# Patient Record
Sex: Female | Born: 1965 | Hispanic: No | Marital: Married | Smoking: Former smoker
Health system: Southern US, Community
[De-identification: ages and names within clinical notes are randomized; demographics above are authoritative.]

## PROBLEM LIST (undated history)

## (undated) DIAGNOSIS — K759 Inflammatory liver disease, unspecified: Secondary | ICD-10-CM

## (undated) DIAGNOSIS — M5430 Sciatica, unspecified side: Secondary | ICD-10-CM

## (undated) DIAGNOSIS — I10 Essential (primary) hypertension: Secondary | ICD-10-CM

## (undated) HISTORY — PX: OTHER SURGICAL HISTORY: SHX169

## (undated) HISTORY — PX: CHOLECYSTECTOMY: SHX55

## (undated) HISTORY — DX: Sciatica, unspecified side: M54.30

---

## 2004-05-16 ENCOUNTER — Ambulatory Visit: Payer: Self-pay | Admitting: Family Medicine

## 2004-06-04 ENCOUNTER — Ambulatory Visit: Payer: Self-pay | Admitting: Family Medicine

## 2005-08-21 ENCOUNTER — Ambulatory Visit: Payer: Self-pay | Admitting: Family Medicine

## 2006-04-15 ENCOUNTER — Emergency Department: Payer: Self-pay | Admitting: Internal Medicine

## 2006-09-28 ENCOUNTER — Emergency Department: Payer: Self-pay | Admitting: Emergency Medicine

## 2007-10-06 ENCOUNTER — Ambulatory Visit: Payer: Self-pay | Admitting: Family Medicine

## 2007-11-28 ENCOUNTER — Emergency Department: Payer: Self-pay | Admitting: Internal Medicine

## 2010-02-09 ENCOUNTER — Emergency Department: Payer: Self-pay | Admitting: Emergency Medicine

## 2011-01-21 ENCOUNTER — Ambulatory Visit: Payer: Self-pay | Admitting: Family Medicine

## 2011-04-30 ENCOUNTER — Ambulatory Visit: Payer: Self-pay

## 2011-05-03 ENCOUNTER — Ambulatory Visit: Payer: Self-pay

## 2011-06-03 ENCOUNTER — Ambulatory Visit: Payer: Self-pay

## 2011-07-03 ENCOUNTER — Ambulatory Visit: Payer: Self-pay

## 2011-08-03 ENCOUNTER — Ambulatory Visit: Payer: Self-pay

## 2011-11-03 HISTORY — PX: ROUX-EN-Y GASTRIC BYPASS: SHX1104

## 2012-03-25 ENCOUNTER — Ambulatory Visit: Payer: Self-pay | Admitting: Gastroenterology

## 2012-04-13 ENCOUNTER — Other Ambulatory Visit: Payer: Self-pay | Admitting: Family

## 2012-04-13 LAB — APTT: Activated PTT: 27.8 secs (ref 23.6–35.9)

## 2012-04-15 ENCOUNTER — Ambulatory Visit: Payer: Self-pay | Admitting: Gastroenterology

## 2012-04-29 ENCOUNTER — Other Ambulatory Visit (HOSPITAL_COMMUNITY): Payer: Self-pay | Admitting: Family

## 2012-04-29 DIAGNOSIS — B181 Chronic viral hepatitis B without delta-agent: Secondary | ICD-10-CM

## 2012-04-30 ENCOUNTER — Other Ambulatory Visit: Payer: Self-pay | Admitting: Radiology

## 2012-05-01 ENCOUNTER — Other Ambulatory Visit: Payer: Self-pay | Admitting: Radiology

## 2012-05-06 ENCOUNTER — Ambulatory Visit (HOSPITAL_COMMUNITY)
Admission: RE | Admit: 2012-05-06 | Discharge: 2012-05-06 | Disposition: A | Discharge status: Home / Self Care | Payer: BC Managed Care – PPO | Source: Ambulatory Visit | Attending: Gastroenterology | Admitting: Gastroenterology

## 2012-05-06 ENCOUNTER — Encounter (HOSPITAL_COMMUNITY): Payer: Self-pay

## 2012-05-06 ENCOUNTER — Ambulatory Visit (HOSPITAL_COMMUNITY)
Admission: RE | Admit: 2012-05-06 | Discharge: 2012-05-06 | Disposition: A | Discharge status: Home / Self Care | Payer: BC Managed Care – PPO | Source: Ambulatory Visit | Attending: Family | Admitting: Family

## 2012-05-06 DIAGNOSIS — B181 Chronic viral hepatitis B without delta-agent: Secondary | ICD-10-CM

## 2012-05-06 DIAGNOSIS — I10 Essential (primary) hypertension: Secondary | ICD-10-CM | POA: Insufficient documentation

## 2012-05-06 DIAGNOSIS — K7689 Other specified diseases of liver: Secondary | ICD-10-CM | POA: Insufficient documentation

## 2012-05-06 DIAGNOSIS — R7401 Elevation of levels of liver transaminase levels: Secondary | ICD-10-CM | POA: Insufficient documentation

## 2012-05-06 DIAGNOSIS — R7402 Elevation of levels of lactic acid dehydrogenase (LDH): Secondary | ICD-10-CM | POA: Insufficient documentation

## 2012-05-06 DIAGNOSIS — R7989 Other specified abnormal findings of blood chemistry: Secondary | ICD-10-CM | POA: Insufficient documentation

## 2012-05-06 DIAGNOSIS — E119 Type 2 diabetes mellitus without complications: Secondary | ICD-10-CM | POA: Insufficient documentation

## 2012-05-06 HISTORY — DX: Essential (primary) hypertension: I10

## 2012-05-06 HISTORY — DX: Inflammatory liver disease, unspecified: K75.9

## 2012-05-06 LAB — PROTIME-INR
INR: 0.99 (ref 0.00–1.49)
Prothrombin Time: 13 seconds (ref 11.6–15.2)

## 2012-05-06 LAB — CBC
Platelets: 221 10*3/uL (ref 150–400)
RBC: 4.55 MIL/uL (ref 3.87–5.11)
RDW: 13.8 % (ref 11.5–15.5)
WBC: 7 10*3/uL (ref 4.0–10.5)

## 2012-05-06 LAB — APTT: aPTT: 30 seconds (ref 24–37)

## 2012-05-06 MED ORDER — SODIUM CHLORIDE 0.9 % IV SOLN
INTRAVENOUS | Status: DC
  Administered 2012-05-06: 09:00:00 via INTRAVENOUS

## 2012-05-06 MED ORDER — FENTANYL CITRATE 0.05 MG/ML IJ SOLN
INTRAMUSCULAR | Status: AC | PRN
  Administered 2012-05-06: 50 ug via INTRAVENOUS
  Administered 2012-05-06 (×2): 25 ug via INTRAVENOUS

## 2012-05-06 MED ORDER — FENTANYL CITRATE 0.05 MG/ML IJ SOLN
INTRAMUSCULAR | Status: AC
  Filled 2012-05-06: qty 6

## 2012-05-06 MED ORDER — MIDAZOLAM HCL 2 MG/2ML IJ SOLN
INTRAMUSCULAR | Status: AC
  Filled 2012-05-06: qty 6

## 2012-05-06 MED ORDER — MIDAZOLAM HCL 2 MG/2ML IJ SOLN
INTRAMUSCULAR | Status: AC | PRN
  Administered 2012-05-06 (×2): 0.5 mg via INTRAVENOUS
  Administered 2012-05-06: 1 mg via INTRAVENOUS

## 2012-05-06 NOTE — Procedures (Signed)
Technically successful US guided biopsy of inferior aspect of right lobe of the liver.  No immediate complications.

## 2012-05-06 NOTE — H&P (Signed)
Carrie Davies is an 47 y.o. female.   Chief Complaint: "I'm here for a liver biopsy" HPI: Patient with history of chronic hepatitis B, elevated LFT's and recent nondiagnostic random liver biopsy at [facility]. She presents today for repeat random liver biopsy.  Past Medical History  Diagnosis Date  . Hypertension   . Hepatitis   DM, carpal tunnel syndrome, ?prior TB  Past Surgical History  Procedure Laterality Date  . Cholecystectomy    . 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 Cancer - Other Mother    Social History:  does not have a smoking history on file. She has never used smokeless tobacco. She reports that she does not drink alcohol or use illicit drugs.  Allergies: Not on File  Current outpatient prescriptions:triamterene-hydrochlorothiazide (MAXZIDE-25) 37.5-25 MG per tablet, Take 1 tablet by mouth daily., Disp: , Rfl:  Current facility-administered medications:0.9 %  sodium chloride infusion, , Intravenous, Continuous, D Jeananne Rama, PA-C   Results for orders placed during the hospital encounter of 05/06/12 (from the past 48 hour(s))  APTT     Status: None   Collection Time    05/06/12  8:35 AM      Result Value Range   aPTT 30  24 - 37 seconds  CBC     Status: None   Collection Time    05/06/12  8:35 AM      Result Value Range   WBC 7.0  4.0 - 10.5 K/uL   RBC 4.55  3.87 - 5.11 MIL/uL   Hemoglobin 13.4  12.0 - 15.0 g/dL   HCT 96.0  45.4 - 09.8 %   MCV 85.9  78.0 - 100.0 fL   MCH 29.5  26.0 - 34.0 pg   MCHC 34.3  30.0 - 36.0 g/dL   RDW 11.9  14.7 - 82.9 %   Platelets 221  150 - 400 K/uL  PROTIME-INR     Status: None   Collection Time    05/06/12  8:35 AM      Result Value Range   Prothrombin Time 13.0  11.6 - 15.2 seconds   INR 0.99  0.00 - 1.49   No results found.  Review of Systems  Constitutional: Negative for fever and chills.  Respiratory: Negative for cough and shortness of  breath.   Cardiovascular: Negative for chest pain.  Gastrointestinal: Negative for nausea, vomiting and abdominal pain.  Musculoskeletal: Negative for back pain.  Neurological: Negative for headaches.  Endo/Heme/Allergies: Does not bruise/bleed easily.    Blood pressure 125/82, pulse 74, temperature 98.6 F (37 C), temperature source Oral, resp. rate 18, height 5\' 5"  (1.651 m), weight 232 lb (105.235 kg), SpO2 99.00%. Physical Exam  Constitutional: She is oriented to person, place, and time. She appears well-developed and well-nourished.  Cardiovascular: Normal rate and regular rhythm.   Respiratory: Effort normal and breath sounds normal.  GI: Soft. Bowel sounds are normal. There is no tenderness.  obese  Musculoskeletal: Normal range of motion. She exhibits no edema.  Neurological: She is alert and oriented to person, place, and time.     Assessment/Plan Pt with hx chronic hepatitis B, elevated LFT's and recent nondiagnostic random liver biopsy at [facility].  Plan is for repeat random liver biopsy today. Details/risks of procedure d/w pt/husband with their understanding and consent.  ALLRED,D KEVIN 05/06/2012, 9:31 AM

## 2012-08-05 ENCOUNTER — Ambulatory Visit: Payer: Self-pay | Admitting: Otolaryngology

## 2012-09-02 ENCOUNTER — Ambulatory Visit: Payer: Self-pay | Admitting: Family Medicine

## 2012-10-14 ENCOUNTER — Ambulatory Visit: Payer: Self-pay | Admitting: Unknown Physician Specialty

## 2012-10-14 LAB — BASIC METABOLIC PANEL
Anion Gap: 6 — ABNORMAL LOW (ref 7–16)
BUN: 18 mg/dL (ref 7–18)
Calcium, Total: 8.9 mg/dL (ref 8.5–10.1)
Chloride: 107 mmol/L (ref 98–107)
Co2: 29 mmol/L (ref 21–32)
Creatinine: 0.71 mg/dL (ref 0.60–1.30)
EGFR (African American): >60
EGFR (Non-African Amer.): >60
Glucose: 134 mg/dL — ABNORMAL HIGH (ref 65–99)
Osmolality: 287 (ref 275–301)
Potassium: 3.7 mmol/L (ref 3.5–5.1)
Sodium: 142 mmol/L (ref 136–145)

## 2012-10-26 ENCOUNTER — Ambulatory Visit: Payer: Self-pay | Admitting: Anesthesiology

## 2012-10-26 DIAGNOSIS — I10 Essential (primary) hypertension: Secondary | ICD-10-CM

## 2012-10-28 ENCOUNTER — Ambulatory Visit: Payer: Self-pay | Admitting: Otolaryngology

## 2012-10-29 LAB — PATHOLOGY REPORT

## 2013-09-08 ENCOUNTER — Ambulatory Visit: Payer: Self-pay

## 2014-06-24 NOTE — Op Note (Signed)
PATIENT NAME:  Carrie Davies, Carrie Davies MR#:  [id number] DATE OF BIRTH:  1965/03/23  DATE OF PROCEDURE:  10/28/2012  PREOPERATIVE DIAGNOSIS: Right submandibular gland mass.  POSTOPERATIVE DIAGNOSIS: Right submandibular gland mass.  PROCEDURE: Excision of right submandibular gland.  SURGEON: Marion Downer, M.D.   ANESTHESIA: General endotracheal.   INDICATIONS: This is a 49 year old with a mass in the right submandibular gland. FNA suggests benign pathology.   FINDINGS: There was a 1.5 cm firm mass in the posterosuperior aspect of the right submandibular gland.   COMPLICATIONS: None.   DESCRIPTION OF PROCEDURE: After obtaining informed consent, the patient was taken to the Operating Room and placed in the supine position. After induction of general endotracheal anesthesia, the patient was turned 90 degrees. The neck was injected with 1% lidocaine with epinephrine 1:200,000. She was then prepped and draped in the usual sterile fashion. A #15 blade was used to incise the skin in a crease at least 2 fingerbreadths below the margin of the mandible. The incision was carried down through the platysma with the Harmonic scalpel. A subplatysmal flap was then elevated off of the submandibular gland, dividing fascial attachments with the Harmonic scalpel. Care was taken during the entire procedure to monitor for any movement of the marginal mandibular nerve during dissection. The gland was carefully dissected out, and there appeared to be quite a number of adhesions around the gland. The vascular supply to the gland, including the facial artery, were divided either with the Harmonic scalpel or by clamping and tying. The facial artery in particular was clamped and tied to ensure hemostasis. The gland was particularly stuck down around the mylohyoid muscle, and the Harmonic scalpel was used to dissect the gland off of the muscle in the areas where it was more stuck. The mylohyoid muscle was then elevated, and the  lingual nerve and its branches into the gland were identified. These were clamped and suture ligated. Dissection proceeded more anteriorly up under the mylohyoid muscle where the duct from the gland was clamped and suture ligated. The gland was further dissected from the floor of the neck, taking care not to injure the lingual or hypoglossal nerves. It was sent for pathology. The wound was irrigated, and small area of minor oozing around the lingual nerve was addressed with Surgicel. A #10 TLS drain was placed. The wound was then closed in layers with 4-0 Vicryl suture for the platysma and subcutaneous closure, followed by 5-0 Prolene in a running locked stitch for the skin. The drain was secured with a 5-0 Prolene stitch to the skin. Bacitracin ointment was applied. The patient was returned to the anesthesiologist for awakening. She was awakened and taken to the recovery room in good condition postoperatively. Blood loss was less than 25 mL.    ____________________________ Ollen Gross. Willeen Cass, MD psb:jm D: 10/28/2012 13:38:00 ET T: 10/28/2012 18:11:33 ET JOB#: [id number]  cc: Ollen Gross. Willeen Cass, MD, <Dictator> Sandi Mealy MD ELECTRONICALLY SIGNED 11/04/2012 17:32

## 2014-07-18 DIAGNOSIS — E669 Obesity, unspecified: Secondary | ICD-10-CM | POA: Insufficient documentation

## 2014-07-18 DIAGNOSIS — I10 Essential (primary) hypertension: Secondary | ICD-10-CM | POA: Insufficient documentation

## 2014-08-31 ENCOUNTER — Encounter: Payer: Self-pay | Admitting: Obstetrics and Gynecology

## 2014-08-31 ENCOUNTER — Ambulatory Visit (INDEPENDENT_AMBULATORY_CARE_PROVIDER_SITE_OTHER): Payer: BLUE CROSS/BLUE SHIELD | Admitting: Obstetrics and Gynecology

## 2014-08-31 VITALS — BP 126/81 | HR 69 | Ht 65.0 in | Wt 216.7 lb

## 2014-08-31 DIAGNOSIS — Z01419 Encounter for gynecological examination (general) (routine) without abnormal findings: Secondary | ICD-10-CM | POA: Diagnosis not present

## 2014-08-31 NOTE — Progress Notes (Signed)
  Subjective:    Carrie Davies is a 49 y.o. female who presents for an annual exam. The patient has no complaints today. The patient is sexually active. GYN screening history: last pap: was normal. The patient wears seatbelts: yes. The patient participates in regular exercise: active at work stocking shelves. Has the patient ever been transfused or tattooed?: not asked. The patient reports that there is not domestic violence in her life.   Menstrual History: OB History    No data available      Menarche age: not asked  Patient's last menstrual period was 08/16/2014.    The following portions of the patient's history were reviewed and updated as appropriate: allergies, current medications, past family history, past medical history, past social history, past surgical history and problem list.  Review of Systems A comprehensive review of systems was negative.    Objective:    BP 126/81 mmHg  Pulse 69  Ht 5\' 5"  (1.651 m)  Wt 216 lb 11.2 oz (98.294 kg)  BMI 36.06 kg/m2  LMP 08/16/2014  General Appearance:    Alert, cooperative, no distress, appears stated age  Head:    Normocephalic, without obvious abnormality, atraumatic  Eyes:    PERRL, conjunctiva/corneas clear, EOM's intact, fundi    benign, both eyes  Ears:    Normal TM's and external ear canals, both ears  Nose:   Nares normal, septum midline, mucosa normal, no drainage    or sinus tenderness  Throat:   Lips, mucosa, and tongue normal; teeth and gums normal  Neck:   Supple, symmetrical, trachea midline, no adenopathy;    thyroid:  no enlargement/tenderness/nodules; no carotid   bruit or JVD  Back:     Symmetric, no curvature, ROM normal, no CVA tenderness  Lungs:     Clear to auscultation bilaterally, respirations unlabored  Chest Wall:    No tenderness or deformity   Heart:    Regular rate and rhythm, S1 and S2 normal, no murmur, rub   or gallop  Breast Exam:    No tenderness, masses, or nipple abnormality  Abdomen:      Soft, non-tender, bowel sounds active all four quadrants,    no masses, no organomegaly  Genitalia:    Normal female without lesion, discharge or tenderness  Rectal:    Normal tone, normal prostate, no masses or tenderness;   guaiac negative stool  Extremities:   Extremities normal, atraumatic, no cyanosis or edema  Pulses:   2+ and symmetric all extremities  Skin:   Skin color, texture, turgor normal, no rashes or lesions  Lymph nodes:   Cervical, supraclavicular, and axillary nodes normal  Neurologic:   CNII-XII intact, normal strength, sensation and reflexes    throughout  .    Assessment:    Healthy female exam.    Plan:     Blood tests: Comprehensive metabolic panel, Total cholesterol and vitamin D. Breast self exam technique reviewed and patient encouraged to perform self-exam monthly. Discussed healthy lifestyle modifications. Follow up in 1 year. Pap smear. MMG ordered.  To send copy of labs to PCP hedrick

## 2014-08-31 NOTE — Patient Instructions (Signed)
  Place postmenopausal annual exam patient instructions here.  Thank you for enrolling in MyChart. Please follow the instructions below to securely access your online medical record. MyChart allows you to send messages to your doctor, view your test results, manage appointments, and more.   How Do I Sign Up? 1. In your Internet browser, go to [employer] and enter [email or web address]. 2. Click on the Sign Up Now link in the Sign In box. You will see the New Member Sign Up page. 3. Enter your MyChart Access Code exactly as it appears below. You will not need to use this code after you've completed the sign-up process. If you do not sign up before the expiration date, you must request a new code.  MyChart Access Code: [email or web address] Expires: 10/30/2014  8:56 AM  4. Enter your Social Security Number ([id number]) and Date of Birth (mm/dd/yyyy) as indicated and click Submit. You will be taken to the next sign-up page. 5. Create a MyChart ID. This will be your MyChart login ID and cannot be changed, so think of one that is secure and easy to remember. 6. Create a MyChart password. You can change your password at any time. 7. Enter your Password Reset Question and Answer. This can be used at a later time if you forget your password.  8. Enter your e-mail address. You will receive e-mail notification when new information is available in MyChart. 9. Click Sign Up. You can now view your medical record.   Additional Information Remember, MyChart is NOT to be used for urgent needs. For medical emergencies, dial 911.

## 2014-09-01 LAB — LIPID PANEL
CHOL/HDL RATIO: 2.7 ratio (ref 0.0–4.4)
Cholesterol, Total: 148 mg/dL (ref 100–199)
HDL: 55 mg/dL (ref >39)
LDL CALC: 80 mg/dL (ref 0–99)
TRIGLYCERIDES: 63 mg/dL (ref 0–149)
VLDL CHOLESTEROL CAL: 13 mg/dL (ref 5–40)

## 2014-09-01 LAB — COMPREHENSIVE METABOLIC PANEL
ALT: 23 IU/L (ref 0–32)
AST: 20 IU/L (ref 0–40)
Albumin/Globulin Ratio: 1.6 (ref 1.1–2.5)
Albumin: 4.3 g/dL (ref 3.5–5.5)
Alkaline Phosphatase: 49 IU/L (ref 39–117)
BILIRUBIN TOTAL: 0.2 mg/dL (ref 0.0–1.2)
BUN/Creatinine Ratio: 19 (ref 9–23)
BUN: 13 mg/dL (ref 6–24)
CHLORIDE: 104 mmol/L (ref 97–108)
CO2: 22 mmol/L (ref 18–29)
CREATININE: 0.69 mg/dL (ref 0.57–1.00)
Calcium: 9.1 mg/dL (ref 8.7–10.2)
GFR calc Af Amer: 117 mL/min/{1.73_m2} (ref >59)
GFR, EST NON AFRICAN AMERICAN: 102 mL/min/{1.73_m2} (ref >59)
GLUCOSE: 83 mg/dL (ref 65–99)
Globulin, Total: 2.7 g/dL (ref 1.5–4.5)
POTASSIUM: 3.8 mmol/L (ref 3.5–5.2)
Sodium: 144 mmol/L (ref 134–144)
Total Protein: 7 g/dL (ref 6.0–8.5)

## 2014-09-02 ENCOUNTER — Encounter: Payer: Self-pay | Admitting: *Deleted

## 2014-09-02 LAB — PAP IG AND HPV HIGH-RISK
HPV, HIGH-RISK: NEGATIVE
PAP Smear Comment: 0

## 2014-09-05 LAB — VITAMIN D 1,25 DIHYDROXY
VITAMIN D 1, 25 (OH) TOTAL: 56 pg/mL
Vitamin D2 1, 25 (OH)2: <10 pg/mL
Vitamin D3 1, 25 (OH)2: 56 pg/mL

## 2014-09-06 ENCOUNTER — Encounter: Payer: Self-pay | Admitting: *Deleted

## 2014-09-12 ENCOUNTER — Ambulatory Visit
Admission: RE | Admit: 2014-09-12 | Discharge: 2014-09-12 | Disposition: A | Discharge status: Home / Self Care | Payer: BLUE CROSS/BLUE SHIELD | Source: Ambulatory Visit | Attending: Obstetrics and Gynecology | Admitting: Obstetrics and Gynecology

## 2014-09-12 DIAGNOSIS — Z01419 Encounter for gynecological examination (general) (routine) without abnormal findings: Secondary | ICD-10-CM | POA: Diagnosis present

## 2014-09-12 DIAGNOSIS — Z1231 Encounter for screening mammogram for malignant neoplasm of breast: Secondary | ICD-10-CM | POA: Insufficient documentation

## 2015-09-01 ENCOUNTER — Encounter: Payer: BLUE CROSS/BLUE SHIELD | Admitting: Obstetrics and Gynecology

## 2015-12-26 ENCOUNTER — Other Ambulatory Visit: Payer: Self-pay | Admitting: Family Medicine

## 2015-12-26 DIAGNOSIS — Z1231 Encounter for screening mammogram for malignant neoplasm of breast: Secondary | ICD-10-CM

## 2015-12-27 ENCOUNTER — Other Ambulatory Visit: Payer: Self-pay | Admitting: Family Medicine

## 2015-12-27 ENCOUNTER — Ambulatory Visit
Admission: RE | Admit: 2015-12-27 | Discharge: 2015-12-27 | Disposition: A | Discharge status: Home / Self Care | Payer: Managed Care, Other (non HMO) | Source: Ambulatory Visit | Attending: Family Medicine | Admitting: Family Medicine

## 2015-12-27 DIAGNOSIS — Z1231 Encounter for screening mammogram for malignant neoplasm of breast: Secondary | ICD-10-CM | POA: Insufficient documentation

## 2016-02-08 ENCOUNTER — Encounter: Payer: Self-pay | Admitting: Obstetrics and Gynecology

## 2016-02-08 ENCOUNTER — Ambulatory Visit (INDEPENDENT_AMBULATORY_CARE_PROVIDER_SITE_OTHER): Payer: Managed Care, Other (non HMO) | Admitting: Obstetrics and Gynecology

## 2016-02-08 ENCOUNTER — Encounter: Payer: BLUE CROSS/BLUE SHIELD | Admitting: Obstetrics and Gynecology

## 2016-02-08 VITALS — BP 124/73 | HR 74 | Ht 65.0 in | Wt 211.9 lb

## 2016-02-08 DIAGNOSIS — Z01419 Encounter for gynecological examination (general) (routine) without abnormal findings: Secondary | ICD-10-CM

## 2016-02-08 NOTE — Progress Notes (Signed)
Subjective:   Carrie Davies is a 50 y.o. G1P0 Native American female here for a routine well-woman exam.  Patient's last menstrual period was 08/16/2014.    Current complaints: none PCP: none       doesn't desire labs  Social History: Sexual: heterosexual Marital Status: married Living situation: with spouse Occupation: English as a second language teacher Tobacco/alcohol: no tobacco use Illicit drugs: no history of illicit drug use  The following portions of the patient's history were reviewed and updated as appropriate: allergies, current medications, past family history, past medical history, past social history, past surgical history and problem list.  Past Medical History Past Medical History:  Diagnosis Date  . Hepatitis   . Hypertension     Past Surgical History Past Surgical History:  Procedure Laterality Date  . CESAREAN SECTION  2003  . CHOLECYSTECTOMY    . 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 Contraception: post menopausal status Last Pap: 2016. Results were: normal Last mammogram: 2017. Results were: normal  Obstetric History OB History  Gravida Para Term Preterm AB Living  1            SAB TAB Ectopic Multiple Live Births               # Outcome Date GA Lbr Len/2nd Weight Sex Delivery Anes PTL Lv  1 Gravida 2003    M CS-Unspec         Current Medications Current Outpatient Prescriptions on File Prior to Visit  Medication Sig Dispense Refill  . calcium citrate-vitamin D (CITRACAL+D) 315-200 MG-UNIT per tablet Take 2 tablets by mouth daily.    . ferrous sulfate 325 (65 FE) MG tablet Take 325 mg by mouth daily.    Marland Kitchen triamterene-hydrochlorothiazide (MAXZIDE-25) 37.5-25 MG per tablet Take 1 tablet by mouth daily.     No current facility-administered medications on file prior to visit.     Review of Systems Patient denies any headaches, blurred vision, shortness of breath, chest pain,  abdominal pain, problems with bowel movements, urination, or intercourse.  Objective:  BP 124/73   Pulse 74   Ht 5\' 5"  (1.651 m)   Wt 211 lb 14.4 oz (96.1 kg)   LMP 08/16/2014   BMI 35.26 kg/m  Physical Exam  General:  Well developed, well nourished, no acute distress. She is alert and oriented x3. Skin:  Warm and dry Neck:  Midline trachea, no thyromegaly or nodules Cardiovascular: Regular rate and rhythm, no murmur heard Lungs:  Effort normal, all lung fields clear to auscultation bilaterally Breasts:  No dominant palpable mass, retraction, or nipple discharge Abdomen:  Soft, non tender, no hepatosplenomegaly or masses Pelvic:  External genitalia is normal in appearance.  The vagina is normal in appearance. The cervix is bulbous, no CMT.  Thin prep pap is not done. Uterus is felt to be normal size, shape, and contour.  No adnexal masses or tenderness noted. Extremities:  No swelling or varicosities noted Psych:  She has a normal mood and affect  Assessment:   Healthy well-woman exam   Plan:   F/U 1 year for AE, or sooner if needed Mammogram due next year or sooner if problems   Rockey Guarino Suzan Nailer, CNM

## 2016-03-18 ENCOUNTER — Encounter: Payer: Self-pay | Admitting: Emergency Medicine

## 2016-03-18 ENCOUNTER — Ambulatory Visit
Admission: EM | Admit: 2016-03-18 | Discharge: 2016-03-18 | Disposition: A | Discharge status: Home / Self Care | Payer: Managed Care, Other (non HMO) | Attending: Family Medicine | Admitting: Family Medicine

## 2016-03-18 DIAGNOSIS — H9202 Otalgia, left ear: Secondary | ICD-10-CM | POA: Diagnosis not present

## 2016-03-18 NOTE — Discharge Instructions (Signed)
° °  Follow up with your primary care physician this week as needed. Return to Urgent care for new or worsening concerns.  ° °

## 2016-03-18 NOTE — ED Provider Notes (Signed)
[facility] ____________________________________________  Time seen: Approximately 5:16 PM  I have reviewed the triage vital signs and the nursing notes.   HISTORY  Chief Complaint Otalgia   HPI Carrie Davies is a 51 y.o. female procedure the complaints of left ear pain that started this morning. Patient reports pain is been intermittent, reports more so when blowing her nose or directly touching. Reports no trauma, denies foreign bodies, hearing changes or tinnitus. Denies known trigger. Denies history of similar in the past. Patient denies recent cough, congestion, sickness or other complaints. Patient reports otherwise feels well. States minimal left ear discomfort at this time. Patient states that she was encouraged by her husband be evaluated. States has not taken over the counter medications for same complaint. Patient states that she has been told in the past by her husband that she does occasionally grinds her teeth. Denies dental pain.  Jerl Mina, MD: PCP   Past Medical History:  Diagnosis Date  . Hepatitis   . Hypertension     Patient Active Problem List   Diagnosis Date Noted  . Obesity 07/18/2014  . HTN (hypertension) 07/18/2014    Past Surgical History:  Procedure Laterality Date  . CESAREAN SECTION  2003  . CHOLECYSTECTOMY    . ROUX-EN-Y GASTRIC BYPASS  sept.2013    No current facility-administered medications for this encounter.   Current Outpatient Prescriptions:  .  calcium citrate-vitamin D (CITRACAL+D) 315-200 MG-UNIT per tablet, Take 2 tablets by mouth daily., Disp: , Rfl:  .  ferrous sulfate 325 (65 FE) MG tablet, Take 325 mg by mouth daily., Disp: , Rfl:  .  triamterene-hydrochlorothiazide (MAXZIDE-25) 37.5-25 MG per tablet, Take 1 tablet by mouth daily., Disp: , Rfl:   Allergies Patient has no known allergies.  Family History  Problem Relation Age of Onset  . Cancer - Other Mother   . Breast cancer Mother 65  . Breast  cancer Paternal Grandmother 51    Social History Social History  Substance Use Topics  . Smoking status: Never Smoker  . Smokeless tobacco: Never Used  . Alcohol use No    Review of Systems Constitutional: No fever/chills Eyes: No visual changes. ENT: No sore throat. As above.  Cardiovascular: Denies chest pain. Respiratory: Denies shortness of breath. Gastrointestinal: No abdominal pain.  No nausea, no vomiting.  No diarrhea.  No constipation. Genitourinary: Negative for dysuria. Musculoskeletal: Negative for back pain. Skin: Negative for rash. Neurological: Negative for headaches, focal weakness or numbness.  10-point ROS otherwise negative.  ____________________________________________   PHYSICAL EXAM:  VITAL SIGNS: ED Triage Vitals  Enc Vitals Group     BP 03/18/16 1601 (!) 152/82     Pulse Rate 03/18/16 1601 67     Resp 03/18/16 1601 16     Temp 03/18/16 1601 97.8 F (36.6 C)     Temp Source 03/18/16 1601 Oral     SpO2 03/18/16 1601 99 %     Weight 03/18/16 1559 204 lb (92.5 kg)     Height 03/18/16 1559 5\' 5"  (1.651 m)     Head Circumference --      Peak Flow --      Pain Score 03/18/16 1600 6     Pain Loc --      Pain Edu? --      Excl. in GC? --     Constitutional: Alert and oriented. Well appearing and in no acute distress. Eyes: Conjunctivae are normal. PERRL. EOMI. ENT  Head: Normocephalic and atraumatic.No sinus tenderness to palpation. No TMJ tenderness bilaterally.     Ears: nontender, no erythema, no effusion, no drainage, normal appearing TMs bilaterally. No surrounding erythema, tenderness or swelling bilaterally.       Nose: No congestion/rhinnorhea.      Mouth/Throat: Mucous membranes are moist.Oropharynx non-erythematous. No tonsillar swelling or exudate.  Neck: No stridor. Supple without meningismus.  Hematological/Lymphatic/Immunilogical: No cervical lymphadenopathy. Cardiovascular: Normal rate, regular rhythm. Grossly normal heart  sounds.  Good peripheral circulation. Respiratory: Normal respiratory effort without tachypnea nor retractions. Breath sounds are clear and equal bilaterally. No wheezes/rales/rhonchi.. Gastrointestinal: Soft and nontender.No CVA tenderness. Musculoskeletal:  Ambulatory with steady gait.  No midline cervical, thoracic or lumbar tenderness to palpation.  Neurologic:  Normal speech and language. Speech is normal. No gait instability.  Skin:  Skin is warm, dry and intact. No rash noted. Psychiatric: Mood and affect are normal. Speech and behavior are normal. Patient exhibits appropriate insight and judgment   ___________________________________________   LABS (all labs ordered are listed, but only abnormal results are displayed)  Labs Reviewed - No data to display  RADIOLOGY  No results found. ____________________________________________   PROCEDURES Procedures   INITIAL IMPRESSION / ASSESSMENT AND PLAN / ED COURSE  Pertinent labs & imaging results that were available during my care of the patient were reviewed by me and considered in my medical decision making (see chart for details).  Well-appearing patient. No acute distress. States intermittent left ear pain since this morning. Exam unremarkable. No effusion noted, no erythema. TM intact, nontender to palpation. No TMJ tenderness. Discussed with patient monitor and follow-up as needed. Over-the-counter Tylenol or ibuprofen as needed.  Discussed follow up with Primary care physician this week. Discussed follow up and return parameters including no resolution or any worsening concerns. Patient verbalized understanding and agreed to plan.   ____________________________________________   FINAL CLINICAL IMPRESSION(S) / ED DIAGNOSES  Final diagnoses:  Acute otalgia, left     Discharge Medication List as of 03/18/2016  4:41 PM      Note: This dictation was prepared with Dragon dictation along with smaller phrase technology.  Any transcriptional errors that result from this process are unintentional.    Clinical Course       Renford Dills, NP 03/18/16 1722

## 2016-03-18 NOTE — ED Triage Notes (Signed)
Patient c/o left ear pain that started this morning.  

## 2016-04-05 ENCOUNTER — Encounter: Payer: BLUE CROSS/BLUE SHIELD | Admitting: Obstetrics and Gynecology

## 2016-04-18 ENCOUNTER — Encounter: Payer: BLUE CROSS/BLUE SHIELD | Admitting: Obstetrics and Gynecology

## 2016-11-29 ENCOUNTER — Other Ambulatory Visit: Payer: Self-pay | Admitting: Family Medicine

## 2016-11-29 DIAGNOSIS — Z1231 Encounter for screening mammogram for malignant neoplasm of breast: Secondary | ICD-10-CM

## 2017-01-01 ENCOUNTER — Ambulatory Visit
Admission: RE | Admit: 2017-01-01 | Discharge: 2017-01-01 | Disposition: A | Discharge status: Home / Self Care | Payer: Managed Care, Other (non HMO) | Source: Ambulatory Visit | Attending: Family Medicine | Admitting: Family Medicine

## 2017-01-01 DIAGNOSIS — Z1231 Encounter for screening mammogram for malignant neoplasm of breast: Secondary | ICD-10-CM | POA: Insufficient documentation

## 2017-02-07 ENCOUNTER — Encounter: Payer: Self-pay | Admitting: Obstetrics and Gynecology

## 2017-02-07 ENCOUNTER — Ambulatory Visit (INDEPENDENT_AMBULATORY_CARE_PROVIDER_SITE_OTHER): Payer: Managed Care, Other (non HMO) | Admitting: Obstetrics and Gynecology

## 2017-02-07 DIAGNOSIS — Z01419 Encounter for gynecological examination (general) (routine) without abnormal findings: Secondary | ICD-10-CM

## 2017-02-07 NOTE — Progress Notes (Signed)
Subjective:   Carrie Davies is a 51 y.o. G1P0 Caucasian female here for a routine well-woman exam.  Patient's last menstrual period was 08/16/2014.    Current complaints: none PCP: Burnett Sheng       does desire labs  Social History: Sexual: heterosexual Marital Status: married Living situation: with family Occupation: unknown occupation Tobacco/alcohol: no tobacco use Illicit drugs: no history of illicit drug use  The following portions of the patient's history were reviewed and updated as appropriate: allergies, current medications, past family history, past medical history, past social history, past surgical history and problem list.  Past Medical History Past Medical History:  Diagnosis Date  . Hepatitis   . Hypertension     Past Surgical History Past Surgical History:  Procedure Laterality Date  . CESAREAN SECTION  2003  . CHOLECYSTECTOMY    . 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 Contraception: post menopausal status Last Pap: 2016. Results were: normal Last mammogram: 2017. Results were: normal   Obstetric History OB History  Gravida Para Term Preterm AB Living  1            SAB TAB Ectopic Multiple Live Births               # Outcome Date GA Lbr Len/2nd Weight Sex Delivery Anes PTL Lv  1 Gravida 2003    M CS-Unspec         Current Medications Current Outpatient Medications on File Prior to Visit  Medication Sig Dispense Refill  . calcium citrate-vitamin D (CITRACAL+D) 315-200 MG-UNIT per tablet Take 2 tablets by mouth daily.    . ferrous sulfate 325 (65 FE) MG tablet Take 325 mg by mouth daily.    Marland Kitchen triamterene-hydrochlorothiazide (MAXZIDE-25) 37.5-25 MG per tablet Take 1 tablet by mouth daily.     No current facility-administered medications on file prior to visit.     Review of Systems Patient denies any headaches, blurred vision, shortness of breath, chest pain, abdominal  pain, problems with bowel movements, urination, or intercourse.  Objective:  LMP 08/16/2014  Physical Exam  General:  Well developed, well nourished, no acute distress. She is alert and oriented x3. Skin:  Warm and dry Neck:  Midline trachea, no thyromegaly or nodules Cardiovascular: Regular rate and rhythm, no murmur heard Lungs:  Effort normal, all lung fields clear to auscultation bilaterally Breasts:  No dominant palpable mass, retraction, or nipple discharge Abdomen:  Soft, non tender, no hepatosplenomegaly or masses Pelvic:  External genitalia is normal in appearance.  The vagina is normal in appearance. The cervix is bulbous, no CMT.  Thin prep pap is not done . Uterus is felt to be normal size, shape, and contour.  No adnexal masses or tenderness noted.  Extremities:  No swelling or varicosities noted Psych:  She has a normal mood and affect  Assessment:   Healthy well-woman exam  Plan:   F/U 1 year for Ae, or sooner if needed Mammogram ordered Terik Haughey Suzan Nailer, CNM

## 2017-02-07 NOTE — Patient Instructions (Signed)
  Place annual gynecologic exam patient instructions here.  Thank you for enrolling in MyChart. Please follow the instructions below to securely access your online medical record. MyChart allows you to send messages to your doctor, view your test results, manage appointments, and more.   How Do I Sign Up? 1. In your Internet browser, go to [employer] and enter [email or web address]. 2. Click on the Sign Up Now link in the Sign In box. You will see the New Member Sign Up page. 3. Enter your MyChart Access Code exactly as it appears below. You will not need to use this code after you've completed the sign-up process. If you do not sign up before the expiration date, you must request a new code.  MyChart Access Code: [email or web address] Expires: 03/24/2017 11:38 AM  4. Enter your Social Security Number ([id number]) and Date of Birth (mm/dd/yyyy) as indicated and click Submit. You will be taken to the next sign-up page. 5. Create a MyChart ID. This will be your MyChart login ID and cannot be changed, so think of one that is secure and easy to remember. 6. Create a MyChart password. You can change your password at any time. 7. Enter your Password Reset Question and Answer. This can be used at a later time if you forget your password.  8. Enter your e-mail address. You will receive e-mail notification when new information is available in MyChart. 9. Click Sign Up. You can now view your medical record.   Additional Information Remember, MyChart is NOT to be used for urgent needs. For medical emergencies, dial 911.

## 2017-02-13 ENCOUNTER — Encounter: Payer: Managed Care, Other (non HMO) | Admitting: Obstetrics and Gynecology

## 2017-03-06 ENCOUNTER — Encounter: Payer: Self-pay | Admitting: Obstetrics and Gynecology

## 2017-04-09 ENCOUNTER — Encounter: Payer: Managed Care, Other (non HMO) | Admitting: Obstetrics and Gynecology

## 2017-04-30 ENCOUNTER — Encounter: Payer: Self-pay | Admitting: Obstetrics and Gynecology

## 2017-04-30 ENCOUNTER — Encounter: Payer: Self-pay | Admitting: Certified Nurse Midwife

## 2017-04-30 ENCOUNTER — Ambulatory Visit (INDEPENDENT_AMBULATORY_CARE_PROVIDER_SITE_OTHER): Payer: Managed Care, Other (non HMO) | Admitting: Certified Nurse Midwife

## 2017-04-30 VITALS — BP 122/82 | HR 76 | Ht 65.0 in | Wt 235.3 lb

## 2017-04-30 DIAGNOSIS — Z0000 Encounter for general adult medical examination without abnormal findings: Secondary | ICD-10-CM

## 2017-04-30 NOTE — Progress Notes (Signed)
Pt is here for an annual exam. LPS 02/2016 WNL

## 2017-04-30 NOTE — Progress Notes (Signed)
GYNECOLOGY ANNUAL PREVENTATIVE CARE ENCOUNTER NOTE  Subjective:   Carrie Davies is a 52 y.o. G1P0 female here for a routine annual gynecologic exam.  Current complaints: none   Denies abnormal vaginal bleeding, discharge, pelvic pain, problems with intercourse or other gynecologic concerns.    Gynecologic History Patient's last menstrual period was 08/16/2014. Contraception: post menopausal status x 7 yrs Last Pap: 08/31/2014 Results were: normal HPV negative Last mammogram: 01/01/17 . Results were: normal Colonoscopy: 3yrs ago , negative per pt.  Obstetric History OB History  Gravida Para Term Preterm AB Living  1            SAB TAB Ectopic Multiple Live Births               # Outcome Date GA Lbr Len/2nd Weight Sex Delivery Anes PTL Lv  1 Gravida 2003    M CS-Unspec         Past Medical History:  Diagnosis Date  . Hepatitis   . Hypertension     Past Surgical History:  Procedure Laterality Date  . CESAREAN SECTION  2003  . CHOLECYSTECTOMY    . 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 Biotin 10 MG CAPS Take by mouth.    . calcium citrate-vitamin D (CITRACAL+D) 315-200 MG-UNIT per tablet Take 2 tablets by mouth daily.    . ferrous sulfate 325 (65 FE) MG tablet Take 325 mg by mouth daily.    . multivitamin-iron-minerals-folic acid (CENTRUM) chewable tablet Chew by mouth.    . triamterene-hydrochlorothiazide (MAXZIDE-25) 37.5-25 MG per tablet Take 1 tablet by mouth daily.     No current facility-administered medications on file prior to visit.     No Known Allergies  Social History   Socioeconomic History  . Marital status: Married    Spouse name: Not on file  . Number of children: Not on file  . Years of education: Not on file  . Highest education level: Not on file  Social Needs  . Financial resource strain: Not on file  . Food insecurity - worry: Not on file  . Food  insecurity - inability: Not on file  . Transportation needs - medical: Not on file  . Transportation needs - non-medical: Not on file  Occupational History  . Not on file  Tobacco Use  . Smoking status: Never Smoker  . Smokeless tobacco: Never Used  Substance and Sexual Activity  . Alcohol use: No  . Drug use: No  . Sexual activity: Yes    Birth control/protection: None  Other Topics Concern  . Not on file  Social History Narrative  . Not on file    Family History  Problem Relation Age of Onset  . Cancer - Other Mother   . Breast cancer Mother 35  . Breast cancer Paternal Grandmother 58    The following portions of the patient's history were reviewed and updated as appropriate: allergies, current medications, past family history, past medical history, past social history, past surgical history and problem list.  Review of Systems Pertinent items noted in HPI and remainder of comprehensive ROS otherwise negative.   Objective:  BP 122/82   Pulse 76   Ht 5\' 5"  (1.651 m)   Wt 235 lb 5 oz (106.7 kg)   LMP 08/16/2014   BMI 39.16 kg/m  CONSTITUTIONAL: Well-developed, well-nourished obese female in no acute distress.  HENT:  Normocephalic, atraumatic,  External right and left ear normal. Oropharynx is clear and moist EYES: Conjunctivae and EOM are normal. Pupils are equal, round, and reactive to light. No scleral icterus.  NECK: Normal range of motion, supple, no masses.  Normal thyroid.  SKIN: Skin is warm and dry. No rash noted. Not diaphoretic. No erythema. No pallor. NEUROLOGIC: Alert and oriented to person, place, and time. Normal reflexes, muscle tone coordination. No cranial nerve deficit noted. PSYCHIATRIC: Normal mood and affect. Normal behavior. Normal judgment and thought content. CARDIOVASCULAR: Normal heart rate noted, regular rhythm RESPIRATORY: Clear to auscultation bilaterally. Effort and breath sounds normal, no problems with respiration noted. BREASTS:  Symmetric in size. No masses, skin changes, nipple drainage, or lymphadenopathy. ABDOMEN: Soft, normal bowel sounds, no distention noted.  No tenderness, rebound or guarding.  PELVIC: Normal appearing external genitalia; normal appearing vaginal mucosa and cervix.  No abnormal discharge noted. Normal uterine size, no other palpable masses, no uterine or adnexal tenderness. Exam compromised due to body habitus.  MUSCULOSKELETAL: Normal range of motion. No tenderness.  No cyanosis, clubbing, or edema.  2+ distal pulses.   Assessment and Plan:  Annual Well Women Exam  Pap smear not indicated due 2021 Mammogram scheduled Labs today TSH Routine preventative health maintenance measures emphasized. Please refer to After Visit Summary for other counseling recommendations.    Doreene Burke, CNM

## 2017-04-30 NOTE — Patient Instructions (Addendum)
Exercising to Lose Weight Exercising can help you to lose weight. In order to lose weight through exercise, you need to do vigorous-intensity exercise. You can tell that you are exercising with vigorous intensity if you are breathing very hard and fast and cannot hold a conversation while exercising. Moderate-intensity exercise helps to maintain your current weight. You can tell that you are exercising at a moderate level if you have a higher heart rate and faster breathing, but you are still able to hold a conversation. How often should I exercise? Choose an activity that you enjoy and set realistic goals. Your health care provider can help you to make an activity plan that works for you. Exercise regularly as directed by your health care provider. This may include:  Doing resistance training twice each week, such as: ? Push-ups. ? Sit-ups. ? Lifting weights. ? Using resistance bands.  Doing a given intensity of exercise for a given amount of time. Choose from these options: ? 150 minutes of moderate-intensity exercise every week. ? 75 minutes of vigorous-intensity exercise every week. ? A mix of moderate-intensity and vigorous-intensity exercise every week.  Children, pregnant women, people who are out of shape, people who are overweight, and older adults may need to consult a health care provider for individual recommendations. If you have any sort of medical condition, be sure to consult your health care provider before starting a new exercise program. What are some activities that can help me to lose weight?  Walking at a rate of at least 4.5 miles an hour.  Jogging or running at a rate of 5 miles per hour.  Biking at a rate of at least 10 miles per hour.  Lap swimming.  Roller-skating or in-line skating.  Cross-country skiing.  Vigorous competitive sports, such as football, basketball, and soccer.  Jumping rope.  Aerobic dancing. How can I be more active in my day-to-day  activities?  Use the stairs instead of the elevator.  Take a walk during your lunch break.  If you drive, park your car farther away from work or school.  If you take public transportation, get off one stop early and walk the rest of the way.  Make all of your phone calls while standing up and walking around.  Get up, stretch, and walk around every 30 minutes throughout the day. What guidelines should I follow while exercising?  Do not exercise so much that you hurt yourself, feel dizzy, or get very short of breath.  Consult your health care provider prior to starting a new exercise program.  Wear comfortable clothes and shoes with good support.  Drink plenty of water while you exercise to prevent dehydration or heat stroke. Body water is lost during exercise and must be replaced.  Work out until you breathe faster and your heart beats faster. This information is not intended to replace advice given to you by your health care provider. Make sure you discuss any questions you have with your health care provider. Document Released: 03/23/2010 Document Revised: 07/27/2015 Document Reviewed: 07/22/2013 Elsevier Interactive Patient Education  2018 Carrie Davies, Carrie Davies Preventive care refers to lifestyle choices and visits with your health care provider that can promote health and wellness. What does preventive care include?  A yearly physical exam. This is also called an annual well check.  Dental exams once or twice a year.  Routine eye exams. Ask your health care provider how often you should have your eyes checked.  Personal lifestyle  choices, including: ? Daily care of your teeth and gums. ? Regular physical activity. ? Eating a healthy diet. ? Avoiding tobacco and drug use. ? Limiting alcohol use. ? Practicing safe sex. ? Taking low-dose aspirin daily starting at age 14. ? Taking vitamin and mineral supplements as recommended by your health  care provider. What happens during an annual well check? The services and screenings done by your health care provider during your annual well check will depend on your age, overall health, lifestyle risk factors, and family history of disease. Counseling Your health care provider may ask you questions about your:  Alcohol use.  Tobacco use.  Drug use.  Emotional well-being.  Home and relationship well-being.  Sexual activity.  Eating habits.  Work and work Statistician.  Method of birth control.  Menstrual cycle.  Pregnancy history.  Screening You may have the following tests or measurements:  Height, weight, and BMI.  Blood pressure.  Lipid and cholesterol levels. These may be checked every 5 Davies, or more frequently if you are over 38 Davies old.  Skin check.  Lung cancer screening. You may have this screening every year starting at age 72 if you have a 30-pack-year history of smoking and currently smoke or have quit within the past 15 Davies.  Fecal occult blood test (FOBT) of the stool. You may have this test every year starting at age 60.  Flexible sigmoidoscopy or colonoscopy. You may have a sigmoidoscopy every 5 Davies or a colonoscopy every 10 Davies starting at age 55.  Hepatitis C blood test.  Hepatitis B blood test.  Sexually transmitted disease (STD) testing.  Diabetes screening. This is done by checking your blood sugar (glucose) after you have not eaten for a while (fasting). You may have this done every 1-3 Davies.  Mammogram. This may be done every 1-2 Davies. Talk to your health care provider about when you should start having regular mammograms. This may depend on whether you have a family history of breast cancer.  BRCA-related cancer screening. This may be done if you have a family history of breast, ovarian, tubal, or peritoneal cancers.  Pelvic exam and Pap test. This may be done every 3 Davies starting at age 43. Starting at age 75, this may  be done every 5 Davies if you have a Pap test in combination with an HPV test.  Bone density scan. This is done to screen for osteoporosis. You may have this scan if you are at high risk for osteoporosis.  Discuss your test results, treatment options, and if necessary, the need for more tests with your health care provider. Vaccines Your health care provider may recommend certain vaccines, such as:  Influenza vaccine. This is recommended every year.  Tetanus, diphtheria, and acellular pertussis (Tdap, Td) vaccine. You may need a Td booster every 10 Davies.  Varicella vaccine. You may need this if you have not been vaccinated.  Zoster vaccine. You may need this after age 81.  Measles, mumps, and rubella (MMR) vaccine. You may need at least one dose of MMR if you were born in 1957 or later. You may also need a second dose.  Pneumococcal 13-valent conjugate (PCV13) vaccine. You may need this if you have certain conditions and were not previously vaccinated.  Pneumococcal polysaccharide (PPSV23) vaccine. You may need one or two doses if you smoke cigarettes or if you have certain conditions.  Meningococcal vaccine. You may need this if you have certain conditions.  Hepatitis A vaccine. You  may need this if you have certain conditions or if you travel or work in places where you may be exposed to hepatitis A.  Hepatitis B vaccine. You may need this if you have certain conditions or if you travel or work in places where you may be exposed to hepatitis B.  Haemophilus influenzae type b (Hib) vaccine. You may need this if you have certain conditions.  Talk to your health care provider about which screenings and vaccines you need and how often you need them. This information is not intended to replace advice given to you by your health care provider. Make sure you discuss any questions you have with your health care provider. Document Released: 03/17/2015 Document Revised: 11/08/2015 Document  Reviewed: 12/20/2014 Elsevier Interactive Patient Education  [employer].

## 2017-05-01 LAB — TSH+T4F+T3FREE
Free T4: 1.29 ng/dL (ref 0.82–1.77)
T3, Free: 2.8 pg/mL (ref 2.0–4.4)
TSH: 4.49 u[IU]/mL (ref 0.450–4.500)

## 2017-05-05 ENCOUNTER — Encounter: Payer: Self-pay | Admitting: Certified Nurse Midwife

## 2017-05-29 ENCOUNTER — Encounter: Payer: Managed Care, Other (non HMO) | Admitting: Obstetrics and Gynecology

## 2018-01-05 ENCOUNTER — Ambulatory Visit
Admission: RE | Admit: 2018-01-05 | Discharge: 2018-01-05 | Disposition: A | Discharge status: Home / Self Care | Payer: Managed Care, Other (non HMO) | Source: Ambulatory Visit | Attending: Certified Nurse Midwife | Admitting: Certified Nurse Midwife

## 2018-01-05 DIAGNOSIS — Z0000 Encounter for general adult medical examination without abnormal findings: Secondary | ICD-10-CM | POA: Insufficient documentation

## 2018-03-10 ENCOUNTER — Ambulatory Visit
Admission: EM | Admit: 2018-03-10 | Discharge: 2018-03-10 | Disposition: A | Discharge status: Home / Self Care | Payer: BLUE CROSS/BLUE SHIELD | Attending: Family Medicine | Admitting: Family Medicine

## 2018-03-10 DIAGNOSIS — R05 Cough: Secondary | ICD-10-CM | POA: Diagnosis not present

## 2018-03-10 DIAGNOSIS — J069 Acute upper respiratory infection, unspecified: Secondary | ICD-10-CM | POA: Diagnosis not present

## 2018-03-10 DIAGNOSIS — R0982 Postnasal drip: Secondary | ICD-10-CM

## 2018-03-10 DIAGNOSIS — J029 Acute pharyngitis, unspecified: Secondary | ICD-10-CM

## 2018-03-10 DIAGNOSIS — Z87891 Personal history of nicotine dependence: Secondary | ICD-10-CM | POA: Diagnosis not present

## 2018-03-10 DIAGNOSIS — B9789 Other viral agents as the cause of diseases classified elsewhere: Secondary | ICD-10-CM

## 2018-03-10 DIAGNOSIS — R0981 Nasal congestion: Secondary | ICD-10-CM

## 2018-03-10 LAB — RAPID STREP SCREEN (MED CTR MEBANE ONLY): STREPTOCOCCUS, GROUP A SCREEN (DIRECT): NEGATIVE

## 2018-03-10 MED ORDER — FLUTICASONE PROPIONATE 50 MCG/ACT NA SUSP: 2.0000 | Freq: Every day | NASAL | 0 refills | Status: DC

## 2018-03-10 MED ORDER — AZITHROMYCIN 250 MG PO TABS: 250.0000 mg | ORAL_TABLET | Freq: Every day | ORAL | 0 refills | Status: DC

## 2018-03-10 MED ORDER — BENZONATATE 200 MG PO CAPS: ORAL_CAPSULE | ORAL | 0 refills | Status: DC

## 2018-03-10 NOTE — ED Triage Notes (Signed)
Pt here for dry cough, body aches, sore throat for 2 weeks. Has been taking nyquil and using cough drops without relief.

## 2018-03-10 NOTE — ED Provider Notes (Signed)
[facility]    CSN: [id number] Arrival date & time: 03/10/18  1655     History   Chief Complaint Chief Complaint  Patient presents with  . Cough    HPI Carrie Davies is a 53 y.o. female.   HPI  -year-old female presents with a dry cough body aches and sore throat that she has had for 2 weeks.  Been taking NyQuil and cough drops but has not had significant relief.  Aperture today is 99 degrees pulse rate of 91 respirations 18 O2 sats on room air 97%.        Past Medical History:  Diagnosis Date  . Hepatitis   . Hypertension     Patient Active Problem List   Diagnosis Date Noted  . Obesity 07/18/2014  . HTN (hypertension) 07/18/2014    Past Surgical History:  Procedure Laterality Date  . CESAREAN SECTION  2003  . CHOLECYSTECTOMY    . cyst removed from neck Left    pt states is was benign   . 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 Authorizing Provider  triamterene-hydrochlorothiazide (MAXZIDE-25) 37.5-25 MG per tablet Take 1 tablet by mouth daily.   Yes [provider]  azithromycin (ZITHROMAX) 250 MG tablet Take 1 tablet (250 mg total) by mouth daily. Take first 2 tablets together, then 1 every day until finished. 03/10/18   Lutricia Feil, PA-C  benzonatate (TESSALON) 200 MG capsule Take one cap TID PRN cough 03/10/18   Lutricia Feil, PA-C  Biotin 10 MG CAPS Take by mouth.    [provider]  calcium citrate-vitamin D (CITRACAL+D) 315-200 MG-UNIT per tablet Take 2 tablets by mouth daily.    [provider]  ferrous sulfate 325 (65 FE) MG tablet Take 325 mg by mouth daily.    [provider]  fluticasone (FLONASE) 50 MCG/ACT nasal  spray Place 2 sprays into both nostrils daily. 03/10/18   Lutricia Feil, PA-C  multivitamin-iron-minerals-folic acid (CENTRUM) chewable tablet Chew by mouth.    [provider]    Family History Family History  Problem Relation Age of Onset  . Cancer - Other Mother   . Breast cancer Mother 8  . Breast cancer Paternal Grandmother 18    Social History Social History   Tobacco Use  . Smoking status: Former Games developer  . Smokeless tobacco: Never Used  Substance Use Topics  . Alcohol use: No  . Drug use: No     Allergies   Patient has no known allergies.   Review of Systems Review of Systems  Constitutional: Negative for activity change, appetite change, chills, fatigue and fever.  HENT: Positive for congestion, postnasal drip and sore throat.   Respiratory: Positive for cough.   All other systems reviewed and are negative.    Physical Exam Triage Vital Signs ED Triage Vitals  Enc Vitals Group     BP  03/10/18 1744 127/83     Pulse Rate 03/10/18 1744 91     Resp 03/10/18 1744 18     Temp 03/10/18 1744 99 F (37.2 C)     Temp src --      SpO2 03/10/18 1744 97 %     Weight 03/10/18 1746 230 lb (104.3 kg)     Height 03/10/18 1746 5\' 5"  (1.651 m)     Head Circumference --      Peak Flow --      Pain Score 03/10/18 1745 5     Pain Loc --      Pain Edu? --      Excl. in GC? --    No data found.  Updated Vital Signs BP 127/83 (BP Location: Right Arm)   Pulse 91   Temp 99 F (37.2 C)   Resp 18   Ht 5\' 5"  (1.651 m)   Wt 230 lb (104.3 kg)   LMP 08/16/2014   SpO2 97%   BMI 38.27 kg/m   Visual Acuity Right Eye Distance:   Left Eye Distance:   Bilateral Distance:    Right Eye Near:   Left Eye Near:    Bilateral Near:     Physical Exam Vitals signs and nursing note reviewed.  Constitutional:      General: She is not in acute distress.    Appearance: Normal appearance. She is not ill-appearing, toxic-appearing or diaphoretic.  HENT:     Head:  Normocephalic.     Right Ear: Tympanic membrane, ear canal and external ear normal.     Left Ear: Tympanic membrane, ear canal and external ear normal.     Nose: Nose normal. No congestion or rhinorrhea.     Mouth/Throat:     Mouth: Mucous membranes are moist.     Pharynx: No oropharyngeal exudate or posterior oropharyngeal erythema.  Eyes:     General:        Right eye: No discharge.        Left eye: No discharge.     Conjunctiva/sclera: Conjunctivae normal.  Neck:     Musculoskeletal: Normal range of motion and neck supple.  Pulmonary:     Effort: Pulmonary effort is normal.     Breath sounds: Normal breath sounds.  Musculoskeletal: Normal range of motion.  Lymphadenopathy:     Cervical: No cervical adenopathy.  Skin:    General: Skin is warm and dry.  Neurological:     General: No focal deficit present.     Mental Status: She is alert and oriented to person, place, and time.  Psychiatric:        Mood and Affect: Mood normal.        Behavior: Behavior normal.        Thought Content: Thought content normal.        Judgment: Judgment normal.      UC Treatments / Results  Labs (all labs ordered are listed, but only abnormal results are displayed) Labs Reviewed  RAPID STREP SCREEN ([facility] ONLY)  CULTURE, GROUP A STREP [facility])    EKG None  Radiology No results found.  Procedures Procedures (including critical care time)  Medications Ordered in UC Medications - No data to display  Initial Impression / Assessment and Plan / UC Course  I have reviewed the triage vital signs and the nursing notes.  Pertinent labs & imaging results that were available during my care of the patient were reviewed by me and  considered in my medical decision making (see chart for details).   Has an upper respiratory infection that is lasted over 2 weeks.  Start her on a Z-Pak this will help her.  Also advised her to take fluticasone on a daily basis for next 2 to 3 weeks and  Tessalon Perles for cough.  She not improving recommend she follow-up with her primary care physician.   Final Clinical Impressions(s) / UC Diagnoses   Final diagnoses:  Upper respiratory tract infection, unspecified type   Discharge Instructions   None    ED Prescriptions    Medication Sig Dispense Auth. Provider   azithromycin (ZITHROMAX) 250 MG tablet Take 1 tablet (250 mg total) by mouth daily. Take first 2 tablets together, then 1 every day until finished. 6 tablet Lutricia Feil, PA-C   benzonatate (TESSALON) 200 MG capsule Take one cap TID PRN cough 30 capsule Ovid Curd P, PA-C   fluticasone (FLONASE) 50 MCG/ACT nasal spray Place 2 sprays into both nostrils daily. 16 g Lutricia Feil, PA-C     Controlled Substance Prescriptions [place] Controlled Substance Registry consulted? Not Applicable   Lutricia Feil, PA-C 03/10/18 1843

## 2018-03-12 LAB — CULTURE, GROUP A STREP (THRC)

## 2018-03-15 ENCOUNTER — Telehealth (HOSPITAL_COMMUNITY): Payer: Self-pay | Admitting: Emergency Medicine

## 2018-03-15 NOTE — Telephone Encounter (Signed)
Few strep group A on culture, called patient and spoke with her, states shes feeling better her throat is only sore from coughing. Pt treated with zithromax from previous visit. All questions answered.

## 2018-05-06 ENCOUNTER — Encounter: Payer: Managed Care, Other (non HMO) | Admitting: Obstetrics and Gynecology

## 2018-05-08 ENCOUNTER — Encounter: Payer: Self-pay | Admitting: Obstetrics and Gynecology

## 2018-05-08 ENCOUNTER — Ambulatory Visit (INDEPENDENT_AMBULATORY_CARE_PROVIDER_SITE_OTHER): Payer: BLUE CROSS/BLUE SHIELD | Admitting: Obstetrics and Gynecology

## 2018-05-08 VITALS — BP 112/77 | HR 74 | Ht 65.0 in | Wt 240.7 lb

## 2018-05-08 DIAGNOSIS — Z01419 Encounter for gynecological examination (general) (routine) without abnormal findings: Secondary | ICD-10-CM

## 2018-05-08 DIAGNOSIS — I10 Essential (primary) hypertension: Secondary | ICD-10-CM | POA: Diagnosis not present

## 2018-05-08 DIAGNOSIS — Z6841 Body Mass Index (BMI) 40.0 and over, adult: Secondary | ICD-10-CM

## 2018-05-08 DIAGNOSIS — M25551 Pain in right hip: Secondary | ICD-10-CM

## 2018-05-08 NOTE — Progress Notes (Signed)
Subjective:   Carrie Davies is a 53 y.o. G1P0 Caucasian female here for a routine well-woman exam.  Patient's last menstrual period was 08/16/2014.    Current complaints: right hip pain- being evaluated by PCP PCP: Burnett Sheng       does desire labs  Social History: Sexual: heterosexual Marital Status: married Living situation: with family Occupation: Risk manager Tobacco/alcohol: no tobacco use Illicit drugs: no history of illicit drug use  The following portions of the patient's history were reviewed and updated as appropriate: allergies, current medications, past family history, past medical history, past social history, past surgical history and problem list.  Past Medical History Past Medical History:  Diagnosis Date  . Hepatitis   . Hypertension     Past Surgical History Past Surgical History:  Procedure Laterality Date  . CESAREAN SECTION  2003  . CHOLECYSTECTOMY    . cyst removed from neck Left    pt states is was benign   . 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 Contraception: post menopausal status Last Pap: 2016. Results were: normal Last mammogram: 01/2018. Results were: normal   Obstetric History OB History  Gravida Para Term Preterm AB Living  1            SAB TAB Ectopic Multiple Live Births               # Outcome Date GA Lbr Len/2nd Weight Sex Delivery Anes PTL Lv  1 Gravida 2003    M CS-Unspec       Current Medications Current Outpatient Medications on File Prior to Visit  Medication Sig Dispense Refill  . calcium citrate-vitamin D (CITRACAL+D) 315-200 MG-UNIT per tablet Take 2 tablets by mouth daily.    . ferrous sulfate 325 (65 FE) MG tablet Take 325 mg by mouth daily.    . fluticasone (FLONASE) 50 MCG/ACT nasal spray Place 2 sprays into both nostrils daily. 16 g 0  . multivitamin-iron-minerals-folic acid (CENTRUM) chewable tablet Chew by mouth.    .  triamterene-hydrochlorothiazide (MAXZIDE-25) 37.5-25 MG per tablet Take 1 tablet by mouth daily.    Marland Kitchen azithromycin (ZITHROMAX) 250 MG tablet Take 1 tablet (250 mg total) by mouth daily. Take first 2 tablets together, then 1 every day until finished. (Patient not taking: Reported on 05/08/2018) 6 tablet 0  . benzonatate (TESSALON) 200 MG capsule Take one cap TID PRN cough (Patient not taking: Reported on 05/08/2018) 30 capsule 0  . Biotin 10 MG CAPS Take by mouth.     No current facility-administered medications on file prior to visit.     Review of Systems Patient denies any headaches, blurred vision, shortness of breath, chest pain, abdominal pain, problems with bowel movements, urination, or intercourse.  Objective:  BP 112/77   Pulse 74   Ht 5\' 5"  (1.651 m)   Wt 240 lb 11.2 oz (109.2 kg)   LMP 08/16/2014   BMI 40.05 kg/m  Physical Exam  General:  Well developed, well nourished, no acute distress. She is alert and oriented x3. Skin:  Warm and dry Neck:  Midline trachea, no thyromegaly or nodules Cardiovascular: Regular rate and rhythm, no murmur heard Lungs:  Effort normal, all lung fields clear to auscultation bilaterally Breasts:  No dominant palpable mass, retraction, or nipple discharge Abdomen:  Soft, non tender, no hepatosplenomegaly or masses Pelvic:  External genitalia is normal in appearance.  The vagina is normal in appearance. The cervix  is bulbous, no CMT.  Thin prep pap is not done . Uterus is felt to be normal size, shape, and contour.  No adnexal masses or tenderness noted. Extremities:  No swelling or varicosities noted Psych:  She has a normal mood and affect  Assessment:   Healthy well-woman exam BMI 40 HTN Right hip pain  Plan:  Labs obtained- will follow up accordingly F/U 1 year for AE, or sooner if needed Mammogram due in November or sooner if problems Colonoscopy done in 2015  Lenny Bouchillon Suzan Nailer, CNM

## 2018-05-08 NOTE — Patient Instructions (Signed)

## 2018-05-09 LAB — COMPREHENSIVE METABOLIC PANEL
ALT: 18 IU/L (ref 0–32)
AST: 20 IU/L (ref 0–40)
Albumin/Globulin Ratio: 1.9 (ref 1.2–2.2)
Albumin: 4.2 g/dL (ref 3.8–4.9)
Alkaline Phosphatase: 53 IU/L (ref 39–117)
BUN/Creatinine Ratio: 23 (ref 9–23)
BUN: 14 mg/dL (ref 6–24)
Bilirubin Total: 0.4 mg/dL (ref 0.0–1.2)
CO2: 24 mmol/L (ref 20–29)
Calcium: 9.2 mg/dL (ref 8.7–10.2)
Chloride: 102 mmol/L (ref 96–106)
Creatinine, Ser: 0.6 mg/dL (ref 0.57–1.00)
GFR calc Af Amer: 120 mL/min/{1.73_m2} (ref >59)
GFR calc non Af Amer: 104 mL/min/{1.73_m2} (ref >59)
GLOBULIN, TOTAL: 2.2 g/dL (ref 1.5–4.5)
Glucose: 82 mg/dL (ref 65–99)
Potassium: 4.1 mmol/L (ref 3.5–5.2)
SODIUM: 141 mmol/L (ref 134–144)
Total Protein: 6.4 g/dL (ref 6.0–8.5)

## 2018-05-09 LAB — LIPID PANEL
Chol/HDL Ratio: 2.5 ratio (ref 0.0–4.4)
Cholesterol, Total: 153 mg/dL (ref 100–199)
HDL: 62 mg/dL (ref >39)
LDL Calculated: 71 mg/dL (ref 0–99)
Triglycerides: 102 mg/dL (ref 0–149)
VLDL CHOLESTEROL CAL: 20 mg/dL (ref 5–40)

## 2018-05-09 LAB — VITAMIN D 25 HYDROXY (VIT D DEFICIENCY, FRACTURES): Vit D, 25-Hydroxy: 36 ng/mL (ref 30.0–100.0)

## 2018-05-09 LAB — THYROID PANEL WITH TSH
Free Thyroxine Index: 2 (ref 1.2–4.9)
T3 Uptake Ratio: 28 % (ref 24–39)
T4, Total: 7 ug/dL (ref 4.5–12.0)
TSH: 3.53 u[IU]/mL (ref 0.450–4.500)

## 2018-05-09 LAB — HEMOGLOBIN A1C
Est. average glucose Bld gHb Est-mCnc: 100 mg/dL
Hgb A1c MFr Bld: 5.1 % (ref 4.8–5.6)

## 2018-10-13 ENCOUNTER — Other Ambulatory Visit: Payer: Self-pay

## 2018-10-13 ENCOUNTER — Encounter: Payer: Self-pay | Admitting: Emergency Medicine

## 2018-10-13 ENCOUNTER — Emergency Department: Payer: BC Managed Care – PPO

## 2018-10-13 ENCOUNTER — Emergency Department
Admission: EM | Admit: 2018-10-13 | Discharge: 2018-10-13 | Disposition: A | Discharge status: Home / Self Care | Payer: BC Managed Care – PPO | Attending: Student in an Organized Health Care Education/Training Program | Admitting: Student in an Organized Health Care Education/Training Program

## 2018-10-13 DIAGNOSIS — M25552 Pain in left hip: Secondary | ICD-10-CM | POA: Diagnosis present

## 2018-10-13 DIAGNOSIS — Z87891 Personal history of nicotine dependence: Secondary | ICD-10-CM | POA: Diagnosis not present

## 2018-10-13 DIAGNOSIS — I10 Essential (primary) hypertension: Secondary | ICD-10-CM | POA: Insufficient documentation

## 2018-10-13 DIAGNOSIS — Z79899 Other long term (current) drug therapy: Secondary | ICD-10-CM | POA: Diagnosis not present

## 2018-10-13 DIAGNOSIS — M5432 Sciatica, left side: Secondary | ICD-10-CM | POA: Insufficient documentation

## 2018-10-13 MED ORDER — METHYLPREDNISOLONE 4 MG PO TBPK: ORAL_TABLET | ORAL | 0 refills | Status: DC

## 2018-10-13 MED ORDER — TRAMADOL HCL 50 MG PO TABS: 50.0000 mg | ORAL_TABLET | Freq: Four times a day (QID) | ORAL | 0 refills | Status: AC | PRN

## 2018-10-13 MED ORDER — HYDROMORPHONE HCL 1 MG/ML IJ SOLN
1.0000 mg | Freq: Once | INTRAMUSCULAR | Status: AC
  Administered 2018-10-13: 1 mg via INTRAMUSCULAR
  Filled 2018-10-13: qty 1

## 2018-10-13 MED ORDER — METHYLPREDNISOLONE SODIUM SUCC 125 MG IJ SOLR
125.0000 mg | Freq: Once | INTRAMUSCULAR | Status: AC
  Administered 2018-10-13: 125 mg via INTRAMUSCULAR
  Filled 2018-10-13: qty 2

## 2018-10-13 NOTE — ED Provider Notes (Signed)
[facility] Emergency Department Provider Note   ____________________________________________   First MD Initiated Contact with Patient 10/13/18 0957     (approximate)  I have reviewed the triage vital signs and the nursing notes.   HISTORY  Chief Complaint Hip Pain    HPI Carrie Davies is a 53 y.o. female patient presents with left hip pain and numbness to the left thigh which started this morning prior to her arrival.  Patient states she was just finishing using the bathroom when she stood up she felt shooting pain down her leg.  Patient denies bladder or bowel dysfunction.  Patient states this numbness only to her toes.  Patient rates her pain as a 10/10.  No palliative measure for complaint.         Past Medical History:  Diagnosis Date  . Hepatitis   . Hypertension     Patient Active Problem List   Diagnosis Date Noted  . Obesity 07/18/2014  . HTN (hypertension) 07/18/2014    Past Surgical History:  Procedure Laterality Date  . CESAREAN SECTION  2003  . CHOLECYSTECTOMY    . cyst removed from neck Left    pt states is was benign   . 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 Authorizing Provider  Biotin 10 MG CAPS Take by mouth.    [provider]  calcium citrate-vitamin D (CITRACAL+D) 315-200 MG-UNIT per tablet Take 2 tablets by mouth daily.    [provider]  ferrous sulfate 325 (65 FE) MG tablet Take 325 mg by mouth daily.    [provider]  fluticasone (FLONASE) 50 MCG/ACT nasal spray Place 2 sprays into both nostrils daily. 03/10/18   Lorin Picket, PA-C  methylPREDNISolone (MEDROL DOSEPAK) 4 MG TBPK tablet Take Tapered dose as directed 10/13/18   Sable Feil, PA-C  multivitamin-iron-minerals-folic acid (CENTRUM) chewable tablet Chew by mouth.    [provider]  traMADol (ULTRAM) 50 MG tablet Take 1 tablet (50  mg total) by mouth every 6 (six) hours as needed for up to 3 days. 10/13/18 10/16/18  Sable Feil, PA-C  triamterene-hydrochlorothiazide (MAXZIDE-25) 37.5-25 MG per tablet Take 1 tablet by mouth daily.    [provider]    Allergies Patient has no known allergies.  Family History  Problem Relation Age of Onset  . Cancer - Other Mother   . Breast cancer Mother 81  . Breast cancer Paternal Grandmother 43    Social History Social History   Tobacco Use  . Smoking status: Former Research scientist (life sciences)  . Smokeless tobacco: Never Used  Substance Use Topics  . Alcohol use: No  . Drug use: No    Review of Systems Constitutional: No fever/chills Eyes: No visual changes. ENT: No sore throat. Cardiovascular: Denies chest pain. Respiratory: Denies shortness of breath. Gastrointestinal: No abdominal pain.  No nausea, no vomiting.  No diarrhea.  No constipation. Genitourinary: Negative for dysuria. Musculoskeletal: Positive for back pain. Skin: Negative for rash. Neurological: Numbness left lower extremity.   Endocrine:  Hepatitis and hypertension.   ____________________________________________   PHYSICAL EXAM:  VITAL SIGNS: ED Triage Vitals  Enc Vitals Group     BP 10/13/18 0932 124/76     Pulse Rate 10/13/18 0932 77     Resp 10/13/18 0932 18     Temp 10/13/18 0932 98.4 F (36.9 C)     Temp Source 10/13/18 0932  Oral     SpO2 10/13/18 0932 99 %     Weight 10/13/18 0933 242 lb (109.8 kg)     Height 10/13/18 0933 5\' 5"  (1.651 m)     Head Circumference --      Peak Flow --      Pain Score 10/13/18 0933 10     Pain Loc --      Pain Edu? --      Excl. in GC? --    Constitutional: Alert and oriented. Well appearing and in no acute distress. Cardiovascular: Normal rate, regular rhythm. Grossly normal heart sounds.  Good peripheral circulation. Respiratory: Normal respiratory effort.  No retractions. Lungs CTAB. Gastrointestinal: Soft and nontender. No distention. No abdominal  bruits. No CVA tenderness. Genitourinary: Deferred Musculoskeletal: No obvious deformity of the left hip.  No leg length discrepancy.  No joint effusions.  Neurologic:  Normal speech and language. No gross focal neurologic deficits are appreciated.  Negative bilateral straight leg test.   Skin:  Skin is warm, dry and intact. No rash noted. Psychiatric: Mood and affect are normal. Speech and behavior are normal.  ____________________________________________   LABS (all labs ordered are listed, but only abnormal results are displayed)  Labs Reviewed - No data to display ____________________________________________  EKG   ____________________________________________  RADIOLOGY  ED MD interpretation:    Official radiology report(s): Dg Lumbar Spine 2-3 Views  Result Date: 10/13/2018 CLINICAL DATA:  LEFT hip pain and LEFT leg numbness starting approximately 30 minutes ago while ambulating. No known injury. EXAM: LUMBAR SPINE - 2-3 VIEW COMPARISON:  None. FINDINGS: Minimal levoscoliosis which may be accentuated by patient positioning. No evidence of acute vertebral body subluxation. No fracture line or displaced fracture fragment. No compression fracture deformity. No acute or suspicious osseous lesion. Mild degenerative spondylosis throughout the lumbar spine and lower thoracic spine. Visualized paravertebral soft tissues are unremarkable. IMPRESSION: 1. No acute findings. 2. Mild degenerative spondylosis. Electronically Signed   By: Bary RichardStan  Maynard M.D.   On: 10/13/2018 11:07   Dg Hip Unilat W Or Wo Pelvis 2-3 Views Left  Result Date: 10/13/2018 CLINICAL DATA:  Acute onset of left hip pain. EXAM: DG HIP (WITH OR WITHOUT PELVIS) 2-3V LEFT COMPARISON:  No pertinent prior studies available for comparison. FINDINGS: There is no evidence of hip fracture or dislocation. Minimal bilateral femoroacetabular osteoarthrosis. Large amount of stool within the rectum. Visualized soft tissues otherwise  unremarkable. IMPRESSION: No evidence of acute osseous or articular abnormality. Electronically Signed   By: Jackey LogeKyle  Golden   On: 10/13/2018 10:59    ____________________________________________   PROCEDURES  Procedure(s) performed (including Critical Care):  Procedures   ____________________________________________   INITIAL IMPRESSION / ASSESSMENT AND PLAN / ED COURSE  As part of my medical decision making, I reviewed the following data within the electronic MEDICAL RECORD NUMBER         Carrie Davies was evaluated in Emergency Department on 10/13/2018 for the symptoms described in the history of present illness. She was evaluated in the context of the global COVID-19 pandemic, which necessitated consideration that the patient might be at risk for infection with the SARS-CoV-2 virus that causes COVID-19. Institutional protocols and algorithms that pertain to the evaluation of patients at risk for COVID-19 are in a state of rapid change based on information released by regulatory bodies including the CDC and federal and state organizations. These policies and algorithms were followed during the patient's care in the ED.  Patient presents with radicular  complaints to the left lower extremity along with hip pain.  Physical exam is grossly unremarkable.  Discussed x-ray findings of the lumbar spine and hip suggestive of mild arthritis.  Patient given discharge care instructions and advised follow-up with PCP for continued care.      ____________________________________________   FINAL CLINICAL IMPRESSION(S) / ED DIAGNOSES  Final diagnoses:  Sciatica of left side     ED Discharge Orders         Ordered    methylPREDNISolone (MEDROL DOSEPAK) 4 MG TBPK tablet     10/13/18 1115    traMADol (ULTRAM) 50 MG tablet  Every 6 hours PRN     10/13/18 1115           Note:  This document was prepared using Dragon voice recognition software and may include unintentional dictation errors.     Joni Reining, PA-C 10/13/18 1119    Willy Eddy, MD 10/13/18 [phone]

## 2018-10-13 NOTE — ED Notes (Signed)
See triage note  Presents with pain from left lower back which is moving into left leg  States the pain came on sudden this am  And describes numbness down leg  Unable to bear full wt

## 2018-10-13 NOTE — ED Triage Notes (Signed)
Pt presents to ED via POV with c/o L hip pain and L leg numbness that started approx 30 mins ago while ambulating. Pt states pain shoots through her buttocks down back of her leg. Pt denies known injury.

## 2018-11-03 ENCOUNTER — Other Ambulatory Visit: Payer: Self-pay | Admitting: Family Medicine

## 2018-11-03 DIAGNOSIS — M5442 Lumbago with sciatica, left side: Secondary | ICD-10-CM

## 2018-11-16 ENCOUNTER — Ambulatory Visit: Payer: BC Managed Care – PPO

## 2018-12-10 ENCOUNTER — Other Ambulatory Visit: Payer: Self-pay | Admitting: Family Medicine

## 2018-12-10 DIAGNOSIS — Z1231 Encounter for screening mammogram for malignant neoplasm of breast: Secondary | ICD-10-CM

## 2019-01-13 ENCOUNTER — Ambulatory Visit
Admission: RE | Admit: 2019-01-13 | Discharge: 2019-01-13 | Disposition: A | Discharge status: Home / Self Care | Payer: BC Managed Care – PPO | Source: Ambulatory Visit | Attending: Family Medicine | Admitting: Family Medicine

## 2019-01-13 DIAGNOSIS — Z1231 Encounter for screening mammogram for malignant neoplasm of breast: Secondary | ICD-10-CM | POA: Diagnosis not present

## 2019-12-20 ENCOUNTER — Other Ambulatory Visit: Payer: Self-pay | Admitting: Family Medicine

## 2019-12-20 DIAGNOSIS — Z1231 Encounter for screening mammogram for malignant neoplasm of breast: Secondary | ICD-10-CM

## 2019-12-22 ENCOUNTER — Other Ambulatory Visit (HOSPITAL_COMMUNITY)
Admission: RE | Admit: 2019-12-22 | Discharge: 2019-12-22 | Disposition: A | Discharge status: Home / Self Care | Payer: BC Managed Care – PPO | Source: Ambulatory Visit | Attending: Certified Nurse Midwife | Admitting: Certified Nurse Midwife

## 2019-12-22 ENCOUNTER — Other Ambulatory Visit: Payer: Self-pay

## 2019-12-22 ENCOUNTER — Ambulatory Visit (INDEPENDENT_AMBULATORY_CARE_PROVIDER_SITE_OTHER): Payer: BC Managed Care – PPO | Admitting: Certified Nurse Midwife

## 2019-12-22 ENCOUNTER — Encounter: Payer: Self-pay | Admitting: Certified Nurse Midwife

## 2019-12-22 VITALS — BP 143/80 | HR 91 | Ht 65.0 in | Wt 273.3 lb

## 2019-12-22 DIAGNOSIS — Z114 Encounter for screening for human immunodeficiency virus [HIV]: Secondary | ICD-10-CM

## 2019-12-22 DIAGNOSIS — Z124 Encounter for screening for malignant neoplasm of cervix: Secondary | ICD-10-CM

## 2019-12-22 DIAGNOSIS — Z01419 Encounter for gynecological examination (general) (routine) without abnormal findings: Secondary | ICD-10-CM

## 2019-12-22 DIAGNOSIS — Z23 Encounter for immunization: Secondary | ICD-10-CM | POA: Diagnosis not present

## 2019-12-22 DIAGNOSIS — Z1231 Encounter for screening mammogram for malignant neoplasm of breast: Secondary | ICD-10-CM

## 2019-12-22 DIAGNOSIS — Z1159 Encounter for screening for other viral diseases: Secondary | ICD-10-CM | POA: Diagnosis not present

## 2019-12-22 NOTE — Progress Notes (Signed)
GYNECOLOGY ANNUAL PREVENTATIVE CARE ENCOUNTER NOTE  History:     Carrie Davies is a 54 y.o. G1P0 female here for a routine annual gynecologic exam.  Current complaints: none.   Denies abnormal vaginal bleeding, discharge, pelvic pain, problems with intercourse or other gynecologic concerns.     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 Contraception: status post hysterectomy Last Pap:08/2014  /Results were: normal with negative HPV Last mammogram: 01/14/2019. Results were: normal  Obstetric History OB History  Gravida Para Term Preterm AB Living  1            SAB TAB Ectopic Multiple Live Births               # Outcome Date GA Lbr Len/2nd Weight Sex Delivery Anes PTL Lv  1 Gravida 2003    M CS-Unspec       Past Medical History:  Diagnosis Date  . Hepatitis   . Hypertension     Past Surgical History:  Procedure Laterality Date  . CESAREAN SECTION  2003  . CHOLECYSTECTOMY    . cyst removed from neck Left    pt states is was benign   . 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 Biotin 10 MG CAPS Take by mouth.    . calcium citrate-vitamin D (CITRACAL+D) 315-200 MG-UNIT per tablet Take 2 tablets by mouth daily.    . ferrous sulfate 325 (65 FE) MG tablet Take 325 mg by mouth daily.    . fluticasone (FLONASE) 50 MCG/ACT nasal spray Place 2 sprays into both nostrils daily. 16 g 0  . methylPREDNISolone (MEDROL DOSEPAK) 4 MG TBPK tablet Take Tapered dose as directed 21 tablet 0  . multivitamin-iron-minerals-folic acid (CENTRUM) chewable tablet Chew by mouth.    . triamterene-hydrochlorothiazide (MAXZIDE-25) 37.5-25 MG per tablet Take 1 tablet by mouth daily.     No current facility-administered medications on file prior to visit.     No Known Allergies  Social History:  reports that she has quit smoking. She has never used smokeless tobacco. She reports that she does not drink alcohol and does not use drugs.  Family History  Problem Relation Age of Onset  . Cancer - Other Mother   . Breast cancer Mother 73  . Breast cancer Paternal Grandmother 52    The following portions of the patient's history were reviewed and updated as appropriate: allergies, current medications, past family history, past medical history, past social history, past surgical history and problem list.  Review of Systems Pertinent items noted in HPI and remainder of comprehensive ROS otherwise negative.  Physical Exam:  BP (!) 143/80   Pulse 91   Ht 5\' 5"  (1.651 m)   Wt 273 lb 5 oz (124 kg)   LMP 08/16/2014   BMI 45.48 kg/m  CONSTITUTIONAL: Well-developed, well-nourished, obese female in no acute distress.  HENT:  Normocephalic, atraumatic, External right and left ear normal. Oropharynx is clear and moist EYES: Conjunctivae and EOM are normal. Pupils are equal, round, and reactive to light. No scleral icterus.  NECK: Normal range of motion, supple, no masses.  Normal thyroid.  SKIN: Skin is warm and dry. No rash noted. Not diaphoretic. No erythema. No pallor. MUSCULOSKELETAL: Normal range of motion. No tenderness.  No cyanosis, clubbing, or edema.  2+ distal pulses. NEUROLOGIC: Alert and oriented to person, place, and time. Normal reflexes, muscle tone coordination.  PSYCHIATRIC: Normal mood and affect. Normal behavior. Normal judgment and thought content. CARDIOVASCULAR: Normal heart rate noted, regular rhythm RESPIRATORY: Clear to auscultation bilaterally. Effort and breath sounds normal, no problems with respiration noted. BREASTS: Symmetric in size. No masses, tenderness, skin changes, nipple drainage, or lymphadenopathy bilaterally.  ABDOMEN: Soft, no distention noted.  No tenderness, rebound or guarding.  PELVIC: Normal appearing  external genitalia and urethral meatus; normal appearing vaginal mucosa and cervix.  No abnormal discharge noted.  Pap smear obtained.  Normal uterine size, no other palpable masses, no uterine or adnexal tenderness.  Exam compromised due to body habitus    Assessment and Plan:    1. Women's annual routine gynecological examination   Pap:Will follow up results of pap smear and manage accordingly .Mammogram : ordered Labs: HIV, Hep C screening  Refills/orders: flu given  Referral:none Routine preventative health maintenance measures emphasized. Please refer to After Visit Summary for other counseling recommendations.     Doreene Burke, CNM [facility],  [facility]

## 2019-12-22 NOTE — Patient Instructions (Signed)

## 2019-12-23 LAB — HIV ANTIBODY (ROUTINE TESTING W REFLEX): HIV Screen 4th Generation wRfx: NONREACTIVE

## 2019-12-23 LAB — HEPATITIS C ANTIBODY: Hep C Virus Ab: <0.1 s/co ratio (ref 0.0–0.9)

## 2019-12-26 ENCOUNTER — Other Ambulatory Visit: Payer: Self-pay

## 2019-12-26 ENCOUNTER — Emergency Department: Payer: BC Managed Care – PPO

## 2019-12-26 ENCOUNTER — Inpatient Hospital Stay
Admission: EM | Admit: 2019-12-26 | Discharge: 2020-02-02 | DRG: 207 | Disposition: E | Payer: BC Managed Care – PPO | Attending: Internal Medicine | Admitting: Internal Medicine

## 2019-12-26 DIAGNOSIS — U071 COVID-19: Principal | ICD-10-CM | POA: Diagnosis present

## 2019-12-26 DIAGNOSIS — R6521 Severe sepsis with septic shock: Secondary | ICD-10-CM | POA: Diagnosis not present

## 2019-12-26 DIAGNOSIS — Z01818 Encounter for other preprocedural examination: Secondary | ICD-10-CM

## 2019-12-26 DIAGNOSIS — I2699 Other pulmonary embolism without acute cor pulmonale: Secondary | ICD-10-CM | POA: Diagnosis not present

## 2019-12-26 DIAGNOSIS — J1282 Pneumonia due to coronavirus disease 2019: Secondary | ICD-10-CM | POA: Diagnosis present

## 2019-12-26 DIAGNOSIS — I82409 Acute embolism and thrombosis of unspecified deep veins of unspecified lower extremity: Secondary | ICD-10-CM

## 2019-12-26 DIAGNOSIS — R0602 Shortness of breath: Principal | ICD-10-CM

## 2019-12-26 DIAGNOSIS — B9561 Methicillin susceptible Staphylococcus aureus infection as the cause of diseases classified elsewhere: Secondary | ICD-10-CM | POA: Diagnosis present

## 2019-12-26 DIAGNOSIS — I82451 Acute embolism and thrombosis of right peroneal vein: Secondary | ICD-10-CM | POA: Diagnosis present

## 2019-12-26 DIAGNOSIS — Z9911 Dependence on respirator [ventilator] status: Secondary | ICD-10-CM

## 2019-12-26 DIAGNOSIS — E872 Acidosis: Secondary | ICD-10-CM | POA: Diagnosis present

## 2019-12-26 DIAGNOSIS — R0603 Acute respiratory distress: Secondary | ICD-10-CM

## 2019-12-26 DIAGNOSIS — Z452 Encounter for adjustment and management of vascular access device: Secondary | ICD-10-CM

## 2019-12-26 DIAGNOSIS — J9600 Acute respiratory failure, unspecified whether with hypoxia or hypercapnia: Secondary | ICD-10-CM | POA: Diagnosis present

## 2019-12-26 DIAGNOSIS — Z66 Do not resuscitate: Secondary | ICD-10-CM | POA: Diagnosis not present

## 2019-12-26 DIAGNOSIS — I313 Pericardial effusion (noninflammatory): Secondary | ICD-10-CM | POA: Diagnosis present

## 2019-12-26 DIAGNOSIS — D6489 Other specified anemias: Secondary | ICD-10-CM | POA: Diagnosis not present

## 2019-12-26 DIAGNOSIS — R570 Cardiogenic shock: Secondary | ICD-10-CM | POA: Diagnosis not present

## 2019-12-26 DIAGNOSIS — E876 Hypokalemia: Secondary | ICD-10-CM | POA: Diagnosis present

## 2019-12-26 DIAGNOSIS — R739 Hyperglycemia, unspecified: Secondary | ICD-10-CM | POA: Diagnosis not present

## 2019-12-26 DIAGNOSIS — A419 Sepsis, unspecified organism: Secondary | ICD-10-CM | POA: Diagnosis not present

## 2019-12-26 DIAGNOSIS — I5031 Acute diastolic (congestive) heart failure: Secondary | ICD-10-CM | POA: Diagnosis not present

## 2019-12-26 DIAGNOSIS — R9389 Abnormal findings on diagnostic imaging of other specified body structures: Secondary | ICD-10-CM

## 2019-12-26 DIAGNOSIS — M79605 Pain in left leg: Secondary | ICD-10-CM

## 2019-12-26 DIAGNOSIS — J159 Unspecified bacterial pneumonia: Secondary | ICD-10-CM | POA: Diagnosis not present

## 2019-12-26 DIAGNOSIS — I428 Other cardiomyopathies: Secondary | ICD-10-CM | POA: Diagnosis not present

## 2019-12-26 DIAGNOSIS — Z6841 Body Mass Index (BMI) 40.0 and over, adult: Secondary | ICD-10-CM | POA: Diagnosis not present

## 2019-12-26 DIAGNOSIS — G6281 Critical illness polyneuropathy: Secondary | ICD-10-CM | POA: Diagnosis not present

## 2019-12-26 DIAGNOSIS — Z87891 Personal history of nicotine dependence: Secondary | ICD-10-CM | POA: Diagnosis not present

## 2019-12-26 DIAGNOSIS — Z978 Presence of other specified devices: Secondary | ICD-10-CM

## 2019-12-26 DIAGNOSIS — N179 Acute kidney failure, unspecified: Secondary | ICD-10-CM | POA: Diagnosis not present

## 2019-12-26 DIAGNOSIS — J9382 Other air leak: Secondary | ICD-10-CM | POA: Diagnosis not present

## 2019-12-26 DIAGNOSIS — G928 Other toxic encephalopathy: Secondary | ICD-10-CM | POA: Diagnosis not present

## 2019-12-26 DIAGNOSIS — J9601 Acute respiratory failure with hypoxia: Secondary | ICD-10-CM

## 2019-12-26 DIAGNOSIS — J80 Acute respiratory distress syndrome: Secondary | ICD-10-CM | POA: Diagnosis present

## 2019-12-26 DIAGNOSIS — M79604 Pain in right leg: Secondary | ICD-10-CM

## 2019-12-26 DIAGNOSIS — Z515 Encounter for palliative care: Secondary | ICD-10-CM | POA: Diagnosis not present

## 2019-12-26 DIAGNOSIS — E874 Mixed disorder of acid-base balance: Secondary | ICD-10-CM | POA: Diagnosis present

## 2019-12-26 DIAGNOSIS — E870 Hyperosmolality and hypernatremia: Secondary | ICD-10-CM | POA: Diagnosis not present

## 2019-12-26 DIAGNOSIS — Z7189 Other specified counseling: Secondary | ICD-10-CM | POA: Diagnosis not present

## 2019-12-26 DIAGNOSIS — Z79899 Other long term (current) drug therapy: Secondary | ICD-10-CM

## 2019-12-26 DIAGNOSIS — Z803 Family history of malignant neoplasm of breast: Secondary | ICD-10-CM | POA: Diagnosis not present

## 2019-12-26 DIAGNOSIS — I110 Hypertensive heart disease with heart failure: Secondary | ICD-10-CM | POA: Diagnosis present

## 2019-12-26 DIAGNOSIS — Z9049 Acquired absence of other specified parts of digestive tract: Secondary | ICD-10-CM | POA: Diagnosis not present

## 2019-12-26 DIAGNOSIS — R0902 Hypoxemia: Secondary | ICD-10-CM

## 2019-12-26 DIAGNOSIS — D696 Thrombocytopenia, unspecified: Secondary | ICD-10-CM | POA: Diagnosis not present

## 2019-12-26 DIAGNOSIS — K922 Gastrointestinal hemorrhage, unspecified: Secondary | ICD-10-CM | POA: Diagnosis not present

## 2019-12-26 DIAGNOSIS — R600 Localized edema: Secondary | ICD-10-CM

## 2019-12-26 DIAGNOSIS — Y92239 Unspecified place in hospital as the place of occurrence of the external cause: Secondary | ICD-10-CM | POA: Diagnosis not present

## 2019-12-26 DIAGNOSIS — R0689 Other abnormalities of breathing: Secondary | ICD-10-CM | POA: Diagnosis not present

## 2019-12-26 DIAGNOSIS — Z9884 Bariatric surgery status: Secondary | ICD-10-CM

## 2019-12-26 DIAGNOSIS — E861 Hypovolemia: Secondary | ICD-10-CM | POA: Diagnosis not present

## 2019-12-26 DIAGNOSIS — T380X5A Adverse effect of glucocorticoids and synthetic analogues, initial encounter: Secondary | ICD-10-CM | POA: Diagnosis not present

## 2019-12-26 DIAGNOSIS — E875 Hyperkalemia: Secondary | ICD-10-CM | POA: Diagnosis not present

## 2019-12-26 LAB — CBC WITH DIFFERENTIAL/PLATELET
Abs Immature Granulocytes: 0.07 10*3/uL (ref 0.00–0.07)
Basophils Absolute: 0 10*3/uL (ref 0.0–0.1)
Basophils Relative: 0 %
Eosinophils Absolute: 0 10*3/uL (ref 0.0–0.5)
Eosinophils Relative: 0 %
HCT: 41.1 % (ref 36.0–46.0)
Hemoglobin: 13.6 g/dL (ref 12.0–15.0)
Immature Granulocytes: 1 %
Lymphocytes Relative: 10 %
Lymphs Abs: 0.7 10*3/uL (ref 0.7–4.0)
MCH: 29.8 pg (ref 26.0–34.0)
MCHC: 33.1 g/dL (ref 30.0–36.0)
MCV: 89.9 fL (ref 80.0–100.0)
Monocytes Absolute: 0.4 10*3/uL (ref 0.1–1.0)
Monocytes Relative: 5 %
Neutro Abs: 6.4 10*3/uL (ref 1.7–7.7)
Neutrophils Relative %: 84 %
Platelets: 213 10*3/uL (ref 150–400)
RBC: 4.57 MIL/uL (ref 3.87–5.11)
RDW: 13.3 % (ref 11.5–15.5)
WBC: 7.7 10*3/uL (ref 4.0–10.5)
nRBC: 0 % (ref 0.0–0.2)

## 2019-12-26 LAB — PROTIME-INR
INR: 1 (ref 0.8–1.2)
Prothrombin Time: 12.7 seconds (ref 11.4–15.2)

## 2019-12-26 LAB — BLOOD GAS, VENOUS
Acid-Base Excess: 4.4 mmol/L — ABNORMAL HIGH (ref 0.0–2.0)
Bicarbonate: 29.8 mmol/L — ABNORMAL HIGH (ref 20.0–28.0)
FIO2: 1
O2 Saturation: 77.4 %
Patient temperature: 37
pCO2, Ven: 46 mmHg (ref 44.0–60.0)
pH, Ven: 7.42 (ref 7.250–7.430)
pO2, Ven: 41 mmHg (ref 32.0–45.0)

## 2019-12-26 LAB — COMPREHENSIVE METABOLIC PANEL
ALT: 43 U/L (ref 0–44)
AST: 76 U/L — ABNORMAL HIGH (ref 15–41)
Albumin: 3.7 g/dL (ref 3.5–5.0)
Alkaline Phosphatase: 38 U/L (ref 38–126)
Anion gap: 14 (ref 5–15)
BUN: 9 mg/dL (ref 6–20)
CO2: 27 mmol/L (ref 22–32)
Calcium: 8.3 mg/dL — ABNORMAL LOW (ref 8.9–10.3)
Chloride: 100 mmol/L (ref 98–111)
Creatinine, Ser: 0.74 mg/dL (ref 0.44–1.00)
GFR, Estimated: >60 mL/min (ref >60)
Glucose, Bld: 130 mg/dL — ABNORMAL HIGH (ref 70–99)
Potassium: 2.7 mmol/L — CL (ref 3.5–5.1)
Sodium: 141 mmol/L (ref 135–145)
Total Bilirubin: 0.7 mg/dL (ref 0.3–1.2)
Total Protein: 7.7 g/dL (ref 6.5–8.1)

## 2019-12-26 LAB — LACTIC ACID, PLASMA
Lactic Acid, Venous: 2.9 mmol/L (ref 0.5–1.9)
Lactic Acid, Venous: 0.9 mmol/L (ref 0.5–1.9)

## 2019-12-26 LAB — RESPIRATORY PANEL BY RT PCR (FLU A&B, COVID)
Influenza A by PCR: NEGATIVE
Influenza B by PCR: NEGATIVE
SARS Coronavirus 2 by RT PCR: POSITIVE — AB

## 2019-12-26 LAB — APTT: aPTT: 30 seconds (ref 24–36)

## 2019-12-26 LAB — FIBRIN DERIVATIVES D-DIMER (ARMC ONLY): Fibrin derivatives D-dimer (ARMC): 697.26 ng/mL (FEU) — ABNORMAL HIGH (ref 0.00–499.00)

## 2019-12-26 LAB — CBC
MCH: 29.7 pg (ref 26.0–34.0)
MCHC: 32.9 g/dL (ref 30.0–36.0)
MCV: 90.4 fL (ref 80.0–100.0)
Platelets: 179 10*3/uL (ref 150–400)

## 2019-12-26 LAB — PROCALCITONIN: Procalcitonin: <0.1 ng/mL

## 2019-12-26 LAB — TROPONIN I (HIGH SENSITIVITY): Troponin I (High Sensitivity): 32 ng/L — ABNORMAL HIGH (ref <18)

## 2019-12-26 MED ORDER — SODIUM CHLORIDE 0.9 % IV SOLN: 100.0000 mg | Freq: Every day | INTRAVENOUS | Status: DC

## 2019-12-26 MED ORDER — POTASSIUM CHLORIDE CRYS ER 20 MEQ PO TBCR
40.0000 meq | EXTENDED_RELEASE_TABLET | Freq: Once | ORAL | Status: AC
  Administered 2019-12-26: 40 meq via ORAL
  Filled 2019-12-26: qty 2

## 2019-12-26 MED ORDER — DEXAMETHASONE SODIUM PHOSPHATE 10 MG/ML IJ SOLN
10.0000 mg | INTRAMUSCULAR | Status: DC
  Administered 2019-12-27 – 2019-12-31 (×5): 10 mg via INTRAVENOUS
  Filled 2019-12-26 (×6): qty 1

## 2019-12-26 MED ORDER — POLYETHYLENE GLYCOL 3350 17 G PO PACK: 17.0000 g | PACK | Freq: Every day | ORAL | Status: DC | PRN

## 2019-12-26 MED ORDER — ENOXAPARIN SODIUM 60 MG/0.6ML ~~LOC~~ SOLN
0.5000 mg/kg | SUBCUTANEOUS | Status: DC
  Filled 2019-12-26: qty 1.2

## 2019-12-26 MED ORDER — ENOXAPARIN SODIUM 60 MG/0.6ML ~~LOC~~ SOLN
60.0000 mg | SUBCUTANEOUS | Status: DC
  Administered 2019-12-26 – 2020-01-04 (×10): 60 mg via SUBCUTANEOUS
  Filled 2019-12-26 (×10): qty 0.6

## 2019-12-26 MED ORDER — ONDANSETRON HCL 4 MG/2ML IJ SOLN
4.0000 mg | Freq: Once | INTRAMUSCULAR | Status: AC
  Administered 2019-12-26: 4 mg via INTRAVENOUS
  Filled 2019-12-26: qty 2

## 2019-12-26 MED ORDER — FAMOTIDINE 20 MG PO TABS
20.0000 mg | ORAL_TABLET | Freq: Two times a day (BID) | ORAL | Status: DC
  Administered 2019-12-26: 20 mg via ORAL
  Filled 2019-12-26: qty 1

## 2019-12-26 MED ORDER — SODIUM CHLORIDE 0.9 % IV BOLUS (SEPSIS)
2000.0000 mL | Freq: Once | INTRAVENOUS | Status: AC
  Administered 2019-12-26: 2000 mL via INTRAVENOUS

## 2019-12-26 MED ORDER — LACTATED RINGERS IV SOLN: INTRAVENOUS | Status: DC

## 2019-12-26 MED ORDER — ONDANSETRON HCL 4 MG/2ML IJ SOLN: 4.0000 mg | Freq: Four times a day (QID) | INTRAMUSCULAR | Status: DC | PRN

## 2019-12-26 MED ORDER — SODIUM CHLORIDE 0.9 % IV SOLN
200.0000 mg | Freq: Once | INTRAVENOUS | Status: DC
  Filled 2019-12-26: qty 40

## 2019-12-26 MED ORDER — ACETAMINOPHEN 500 MG PO TABS
1000.0000 mg | ORAL_TABLET | Freq: Once | ORAL | Status: AC
  Administered 2019-12-26: 1000 mg via ORAL
  Filled 2019-12-26: qty 2

## 2019-12-26 MED ORDER — KETOROLAC TROMETHAMINE 30 MG/ML IJ SOLN
15.0000 mg | Freq: Once | INTRAMUSCULAR | Status: AC
  Administered 2019-12-26: 15 mg via INTRAVENOUS
  Filled 2019-12-26: qty 1

## 2019-12-26 MED ORDER — DOCUSATE SODIUM 100 MG PO CAPS: 100.0000 mg | ORAL_CAPSULE | Freq: Two times a day (BID) | ORAL | Status: DC | PRN

## 2019-12-26 MED ORDER — POTASSIUM CHLORIDE 10 MEQ/100ML IV SOLN
10.0000 meq | Freq: Once | INTRAVENOUS | Status: AC
  Administered 2019-12-26: 10 meq via INTRAVENOUS
  Filled 2019-12-26: qty 100

## 2019-12-26 MED ORDER — DEXAMETHASONE SODIUM PHOSPHATE 10 MG/ML IJ SOLN
10.0000 mg | Freq: Once | INTRAMUSCULAR | Status: AC
  Administered 2019-12-26: 10 mg via INTRAVENOUS
  Filled 2019-12-26: qty 1

## 2019-12-26 NOTE — ED Triage Notes (Signed)
Pt states she got her flu shot on Wednesday and since then had been having head aches and a cough- pt states she feels like she can't breath- pt with laboured, shallow breaths and a cough

## 2019-12-26 NOTE — ED Notes (Signed)
Spoke with husband on phone

## 2019-12-26 NOTE — ED Notes (Signed)
Spoke with lab again. They are getting other user logged out of [employer] so VBG label can be printed and handed to RT.

## 2019-12-26 NOTE — ED Notes (Signed)
Intensivist at bedside.

## 2019-12-26 NOTE — ED Notes (Signed)
Nonrebreather reapplied; pt SPO2 was dipping to 92%. Pt SPO2 currently 100% with nonrebreather at 15L/min and HFNC at 60L/min.

## 2019-12-26 NOTE — ED Notes (Signed)
Date and time results received: 01-14-20 1640   Test: COVID Critical Value: +  Name of Provider Notified: Dr. Katrinka Blazing

## 2019-12-26 NOTE — ED Notes (Signed)
X-ray at bedside

## 2019-12-26 NOTE — H&P (Signed)
Carrie Davies is an 54 y.o. female.   Chief Complaint: Shortness of breath and cough worse over the last week  HPI:  This is a 54 year old remote former smoker, with no chronic significant medical issues other than obesity and hypertension, who presents for evaluation of shortness of breath over the last week.  The patient states that approximately 6 weeks ago she developed a dry hacking cough which is not unusual for her during "allergy season and change of season".  However, approximately a week ago she noted fever and congestion and feeling significantly short of breath.  Despite this she went to have her annual medical exam done and had a flu vaccine given at that time.  She not vaccinated against COVID-19.  She has had some chest discomfort when coughing but not at any other time.  No orthopnea or paroxysmal nocturnal dyspnea until last night.  Noted that she could not lay flat to sleep.  He has not had any abdominal pain, no nausea, vomiting or bowel habit disruption.  She has had mild to moderate headache for the last week.  Cough became almost constant a day ago which prompted her to come to the emergency room today.  Aside from this she voices no other complaint.  She was evaluated at the emergency room where she was noted to be tachypneic and have a temperature of 101.6 F on arrival.  She also was noted to have oxygen saturations of 56% on room air.  She was placed on heated high flow O2 and nonrebreather mask and is now saturating 100%.  PCCM has been asked to admit the patient to stepdown/ICU.   Past Medical History:  Diagnosis Date  . Hepatitis   . Hypertension   . Sciatica     Past Surgical History:  Procedure Laterality Date  . CESAREAN SECTION  2003  . CHOLECYSTECTOMY    . cyst removed from neck Left    pt states is was benign   . 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 Cancer - Other Mother   . Breast cancer Mother 76  .  Breast cancer Paternal Grandmother 75   Social History   Tobacco Use  . Smoking status: Former Games developer  . Smokeless tobacco: Never Used  Substance Use Topics  . Alcohol use: No   Works at [employer]   Allergies: No Known Allergies  Current Outpatient Medications  Medication Instructions  . calcium citrate-vitamin D (CITRACAL+D) 315-200 MG-UNIT per tablet 2 tablets, Oral, Daily  . ferrous sulfate 325 mg, Oral, Daily  . multivitamin-iron-minerals-folic acid (CENTRUM) chewable tablet 1 tablet, Oral, Daily  . telmisartan-hydrochlorothiazide (MICARDIS HCT) 40-12.5 MG tablet 1 tablet, Oral, Daily     Results for orders placed or performed during the hospital encounter of 12/06/2019 (from the past 48 hour(s))  Lactic acid, plasma     Status: Abnormal   Collection Time: 12/31/2019  1:29 PM  Result Value Ref Range   Lactic Acid, Venous 2.9 (HH) 0.5 - 1.9 mmol/L    Comment: CRITICAL RESULT CALLED TO, READ BACK BY AND VERIFIED WITH Dartha Lodge RN AT 1404 ON 12/27/2019 [facility] Performed at [facility], [address], [place], [place] [address]   Comprehensive metabolic panel     Status: Abnormal   Collection Time: 12/30/2019  1:29 PM  Result Value Ref Range   Sodium 141 135 - 145 mmol/L   Potassium 2.7 (LL) 3.5 - 5.1 mmol/L  Comment: CRITICAL RESULT CALLED TO, READ BACK BY AND VERIFIED WITH REINA GALJOUR RN AT 1404 ON 01/06/20 [facility]    Chloride 100 98 - 111 mmol/L   CO2 27 22 - 32 mmol/L   Glucose, Bld 130 (H) 70 - 99 mg/dL    Comment: Glucose reference range applies only to samples taken after fasting for at least 8 hours.   BUN 9 6 - 20 mg/dL   Creatinine, Ser 1.44 0.44 - 1.00 mg/dL   Calcium 8.3 (L) 8.9 - 10.3 mg/dL   Total Protein 7.7 6.5 - 8.1 g/dL   Albumin 3.7 3.5 - 5.0 g/dL   AST 76 (H) 15 - 41 U/L   ALT 43 0 - 44 U/L   Alkaline Phosphatase 38 38 - 126 U/L   Total Bilirubin 0.7 0.3 - 1.2 mg/dL   GFR, Estimated >31 >54 mL/min    Comment: (NOTE) Calculated using the  CKD-EPI Creatinine Equation (2021)    Anion gap 14 5 - 15    Comment: Performed at [facility], [address] [place], [place] [address]  CBC WITH DIFFERENTIAL     Status: None   Collection Time: Jan 06, 2020  1:29 PM  Result Value Ref Range   WBC 7.7 4.0 - 10.5 K/uL   RBC 4.57 3.87 - 5.11 MIL/uL   Hemoglobin 13.6 12.0 - 15.0 g/dL   HCT 61.9 36 - 46 %   MCV 89.9 80.0 - 100.0 fL   MCH 29.8 26.0 - 34.0 pg   MCHC 33.1 30.0 - 36.0 g/dL   RDW 50.9 32.6 - 71.2 %   Platelets 213 150 - 400 K/uL   nRBC 0.0 0.0 - 0.2 %   Neutrophils Relative % 84 %   Neutro Abs 6.4 1.7 - 7.7 K/uL   Lymphocytes Relative 10 %   Lymphs Abs 0.7 0.7 - 4.0 K/uL   Monocytes Relative 5 %   Monocytes Absolute 0.4 0.1 - 1.0 K/uL   Eosinophils Relative 0 %   Eosinophils Absolute 0.0 0.0 - 0.5 K/uL   Basophils Relative 0 %   Basophils Absolute 0.0 0.0 - 0.1 K/uL   Immature Granulocytes 1 %   Abs Immature Granulocytes 0.07 0.00 - 0.07 K/uL    Comment: Performed at [facility], [address] [place], [place] [address]  Protime-INR     Status: None   Collection Time: 01-06-2020  1:29 PM  Result Value Ref Range   Prothrombin Time 12.7 11.4 - 15.2 seconds   INR 1.0 0.8 - 1.2    Comment: (NOTE) INR goal varies based on device and disease states. Performed at [facility], [address] [place], [place] [address]   APTT     Status: None   Collection Time: Jan 06, 2020  1:29 PM  Result Value Ref Range   aPTT 30 24 - 36 seconds    Comment: Performed at [facility], [address] [place], [place] [address]  Blood Culture (routine x 2)     Status: None (Preliminary result)   Collection Time: 2020/01/06  1:29 PM   Specimen: BLOOD  Result Value Ref Range   Specimen Description BLOOD LEFT ANTECUBITAL    Special Requests      BOTTLES DRAWN AEROBIC AND ANAEROBIC Blood Culture adequate volume   Culture      NO GROWTH < 12 HOURS Performed at [facility], [address] [place], [place] [address]    Report Status PENDING   Respiratory  Panel by RT PCR (Flu A&B, Covid) - Nasopharyngeal Swab     Status: Abnormal   Collection Time: 12/24/2019  1:30 PM   Specimen: Nasopharyngeal Swab  Result Value Ref Range   SARS Coronavirus 2 by RT PCR POSITIVE (A) NEGATIVE    Comment: RESULT CALLED TO, READ BACK BY AND VERIFIED WITH: KATE BUMGARNER ON 12/23/2019 AT 1613 TIK (NOTE) SARS-CoV-2 target nucleic acids are DETECTED.  SARS-CoV-2 RNA is generally detectable in upper respiratory specimens  during the acute phase of infection. Positive results are indicative of the presence of the identified virus, but do not rule out bacterial infection or co-infection with other pathogens not detected by the test. Clinical correlation with patient history and other diagnostic information is necessary to determine patient infection status. The expected result is Negative.  Fact Sheet for Patients:  https://www.moore.com/  Fact Sheet for Healthcare Providers: https://www.young.biz/  This test is not yet approved or cleared by the [place] FDA and  has been authorized for detection and/or diagnosis of SARS-CoV-2 by FDA under an Emergency Use Authorization (EUA).  This EUA will remain in effect (meaning this test c an be used) for the duration of  the COVID-19 declaration under Section 564(b)(1) of the Act, 21 U.S.C. section 360bbb-3(b)(1), unless the authorization is terminated or revoked sooner.      Influenza A by PCR NEGATIVE NEGATIVE   Influenza B by PCR NEGATIVE NEGATIVE    Comment: (NOTE) The Xpert Xpress SARS-CoV-2/FLU/RSV assay is intended as an aid in  the diagnosis of influenza from Nasopharyngeal swab specimens and  should not be used as a sole basis for treatment. Nasal washings and  aspirates are unacceptable for Xpert Xpress SARS-CoV-2/FLU/RSV  testing.  Fact Sheet for  Patients: https://www.moore.com/  Fact Sheet for Healthcare Providers: https://www.young.biz/  This test is not yet approved or cleared by the [place] FDA and  has been authorized for detection and/or diagnosis of SARS-CoV-2 by  FDA under an Emergency Use Authorization (EUA). This EUA will remain  in effect (meaning this test can be used) for the duration of the  Covid-19 declaration under Section 564(b)(1) of the Act, 21  U.S.C. section 360bbb-3(b)(1), unless the authorization is  terminated or revoked. Performed at [facility], [address], [place], [place] [address]   Blood Culture (routine x 2)     Status: None (Preliminary result)   Collection Time: 12/14/2019  1:34 PM   Specimen: BLOOD  Result Value Ref Range   Specimen Description BLOOD RIGHT ANTECUBITAL    Special Requests      BOTTLES DRAWN AEROBIC AND ANAEROBIC Blood Culture results may not be optimal due to an excessive volume of blood received in culture bottles   Culture      NO GROWTH < 12 HOURS Performed at [facility], [address] [place], [place] [address]    Report Status PENDING   Blood gas, venous     Status: Abnormal   Collection Time: 12/29/2019  1:40 PM  Result Value Ref Range   FIO2 1.00    Delivery systems NON-REBREATHER OXYGEN MASK    pH, Ven 7.42 7.25 - 7.43   pCO2, Ven 46 44 - 60 mmHg   pO2, Ven 41.0 32 - 45 mmHg   Bicarbonate 29.8 (H) 20.0 - 28.0 mmol/L   Acid-Base Excess 4.4 (H) 0.0 - 2.0 mmol/L   O2 Saturation 77.4 %   Patient temperature 37.0    Sample type VENOUS  Comment: Performed at [facility], [address] [place], [place] [address]  Lactic acid, plasma     Status: None   Collection Time: 13-Jan-2020  4:04 PM  Result Value Ref Range   Lactic Acid, Venous 0.9 0.5 - 1.9 mmol/L    Comment: Performed at [facility], [address], [place], [place] [address]   DG Chest Port 1 View  Result  Date: 01/13/2020 CLINICAL DATA:  Shortness of breath and sepsis. EXAM: PORTABLE CHEST 1 VIEW COMPARISON:  None. FINDINGS: The cardiomediastinal silhouette is unremarkable. Diffuse bilateral airspace opacities are noted. No pneumothorax or large pleural effusion identified. No acute bony abnormalities are present. IMPRESSION: Diffuse bilateral airspace opacities - question edema versus infection/pneumonia. Electronically Signed   By: Harmon Pier M.D.   On: 01-13-2020 14:23   EKG: Tachycardia abnormal T wave changes consider ischemia in the lateral leads.  Otherwise nonspecific changes.  To tracing performed 2014 nonspecific T wave changes are new.  Review of Systems  A 10 point review of systems was performed and it is as noted above otherwise negative.  Blood pressure 121/74, pulse 88, temperature 99.7 F (37.6 C), temperature source Axillary, resp. rate (!) 26, height 5\' 6"  (1.676 m), weight 123.8 kg, last menstrual period 08/16/2014, SpO2 100 %.   Physical Exam physical examination is limited due to need for PPE/CAPR GENERAL: Obese woman, appears acutely ill but not toxic, mildly tachypneic, on high flow O2 and nonrebreather mask. HEAD: Normocephalic, atraumatic.  EYES: Pupils equal, round, reactive to light.  No scleral icterus.  MOUTH: Oral mucosa moist, cannot assess further due to nonrebreather mask. NECK: Supple. No thyromegaly. Trachea midline. No JVD.  No adenopathy. PULMONARY: No thoracoabdominal asynchrony, no respiratory distress on high flow O2.  Symmetrical air entry cannot assess further due to CAPR. CARDIOVASCULAR: Monitor shows regular rate and rhythm.  ABDOMEN: Obese, soft, nondistended. MUSCULOSKELETAL: No joint deformity, no clubbing, no edema.  NEUROLOGIC: No overt focal deficit, awake alert, PERRL, speech fluent. SKIN: Intact,warm,dry.  No overt rash noted. PSYCH: Oriented, calm and cooperative.  Chest x-ray, independently reviewed, bilateral airspace opacities  consistent with COVID-19 pneumonia:    Assessment/Plan  Acute respiratory failure with hypoxia due to COVID-19 infection COVID-19 pneumonia Supplemental oxygen to keep saturations at 86% or better IV dexamethasone Supportive care Trend D-dimer and procalcitonin CT angio chest pending D-dimer result Antibiotics if procalcitonin elevated otherwise hold Bronchodilators via MDI if needed Monitoring and SDU/ICU High risk for intubation  Hypokalemia Replete  Lactic acidosis Resolved with oxygen supplementation Trend lactic acid  Best practice DVT prophylaxis: Enoxaparin GI prophylaxis: Famotidine    C. 08/18/2014, MD [place] [facility] 01-13-20, 5:21 PM  *This note was dictated using voice recognition software/Dragon.  Despite best efforts to proofread, errors can occur which can change the meaning.  Any change was purely unintentional.

## 2019-12-26 NOTE — ED Notes (Signed)
Dry cough since 6 wks.

## 2019-12-26 NOTE — ED Notes (Signed)
RT at bedside. Attempting to print out sunquest label for VBG, which was drawn. Called lab because computer says lab is using Sunquest and this nurse can't access. Will call lab back.

## 2019-12-26 NOTE — Consult Note (Signed)
PHARMACY CONSULT NOTE - FOLLOW UP  Pharmacy Consult for Electrolyte Monitoring and Replacement   Recent Labs: Potassium (mmol/L)  Date Value  12/05/2019 2.7 (LL)  10/14/2012 3.7   Calcium (mg/dL)  Date Value  02/16/2445 8.3 (L)   Calcium, Total (mg/dL)  Date Value  95/09/2255 8.9   Albumin (g/dL)  Date Value  50/51/8335 3.7  05/08/2018 4.2   Sodium (mmol/L)  Date Value  01/01/2020 141  05/08/2018 141  10/14/2012 142   Assessment: 54 year old female presenting with complaints of not feeling well. Pt is having difficulty breathing, headaches, and cough. PMH includes HTN. Pt is COVID positive. Pharmacy is consulted to monitor electrolytes. Pt QT WNL. K 2.7. Mg and Phosphorous scheduled for tomorrow AM.   Goal of Therapy:  Electrolytes WNL  Plan:  - KCl 10 mEq IV x 1 dose and KCl 40 mEq PO x 1 dose given per MD - Follow up electrolyte levels with AM labs  Reatha Armour, PharmD Pharmacy Resident  12/21/2019 5:24 PM

## 2019-12-26 NOTE — ED Notes (Signed)
Labored breathing at 25-35/min. Bilateral coarse crackles at bases. HFNC at 60L/min and NRB mask at 15L/min.

## 2019-12-26 NOTE — ED Notes (Signed)
Pt placed on purewick per pt's request.  

## 2019-12-26 NOTE — Consult Note (Signed)
Remdesivir - Pharmacy Brief Note   O:  ALT: 43 CXR: Diffuse bilateral airspace opacities - question edema versus infection/pneumonia. SpO2: 97% on 60 NRB   A/P:  Remdesivir 200 mg IVPB once followed by 100 mg IVPB daily x 4 days.   Reatha Armour, PharmD Pharmacy Resident  01/07/2020 2:26 PM

## 2019-12-26 NOTE — ED Notes (Signed)
Pt resting in bed at 80 degrees HOB. Pt 98% SPO2 currently, on 60L/min HFNC and 15L/min nonrebreather mask. Pt respirations 25-35/min and labored. Bilateral coarse crackles at lung bases. No further needs expressed at this time.

## 2019-12-26 NOTE — ED Notes (Signed)
Pt on 60L O2 on HFNC at 100% Fi02 and also on 15L/min on nonrebreather mask per RT. RT walking out with VBG in hand. SPO2 currently 96%.

## 2019-12-26 NOTE — ED Notes (Signed)
Pt to ED room. SPO2 80% on 6L [place]. Moved to nonrebreather mask at 15L. Pt now 89%. EDP at bedside.

## 2019-12-26 NOTE — ED Provider Notes (Signed)
[facility] Emergency Department Provider Note  Time seen: 1:33 PM  I have reviewed the triage vital signs and the nursing notes.   HISTORY  Chief Complaint Shortness of Breath   HPI Carrie Davies is a 54 y.o. female with a past medical history of hypertension presents to the emergency department for cough shortness of breath.  According to the patient for the past 6 weeks or so she has had a pretty frequent cough although she admits worse over the past 1 week.  Patient states over the past 1 week she has had fever congestion and feeling significantly short of breath.  Patient states on Wednesday she went to her doctor and had a flu shot.  Patient has not been vaccinated against Covid.  Patient states mild chest pain mostly when coughing as well as mild upper abdominal pain again only when coughing.  Denies any vomiting or diarrhea.   Moderate headache.  Frequent cough.  Past Medical History:  Diagnosis Date  . Hepatitis   . Hypertension   . Sciatica     Patient Active Problem List   Diagnosis Date Noted  . Obesity 07/18/2014  . HTN (hypertension) 07/18/2014    Past Surgical History:  Procedure Laterality Date  . CESAREAN SECTION  2003  . CHOLECYSTECTOMY    . cyst removed from neck Left    pt states is was benign   . 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 Authorizing Provider  calcium citrate-vitamin D (CITRACAL+D) 315-200 MG-UNIT per tablet Take 2 tablets by mouth daily.    [provider]  ferrous sulfate 325 (65 FE) MG tablet Take 325 mg by mouth daily.    [provider]  multivitamin-iron-minerals-folic acid (CENTRUM) chewable tablet Chew by mouth.    [provider]  telmisartan-hydrochlorothiazide (MICARDIS HCT) 40-12.5 MG tablet Take 1 tablet by mouth daily. 10/01/19   [provider]  triamterene-hydrochlorothiazide (MAXZIDE-25)  37.5-25 MG per tablet Take 1 tablet by mouth daily.    [provider]    No Known Allergies  Family History  Problem Relation Age of Onset  . Cancer - Other Mother   . Breast cancer Mother 38  . Breast cancer Paternal Grandmother 69    Social History Social History   Tobacco Use  . Smoking status: Former Games developer  . Smokeless tobacco: Never Used  Vaping Use  . Vaping Use: Never used  Substance Use Topics  . Alcohol use: No  . Drug use: No    Review of Systems Constitutional: Positive for fever intermittent x1 week. ENT: Positive for congestion Cardiovascular: Positive for chest pain Respiratory: Positive for shortness of breath.  Frequent cough. Gastrointestinal: Negative for abdominal pain, vomiting  Musculoskeletal: Negative for musculoskeletal complaints Neurological: Negative for headache All other ROS negative  ____________________________________________   PHYSICAL EXAM:  VITAL SIGNS: ED Triage Vitals  Enc Vitals Group     BP 12/05/2019 1300 (!) 149/61     Pulse Rate 12/09/2019 1300 (!) 110     Resp 12/22/2019 1300 (!) 30     Temp 12/19/2019 1300 (!) 101.6 F (38.7 C)     Temp Source 12/27/2019 1300 Oral     SpO2 12/21/2019 1300 (!) 56 %     Weight 12/17/2019 1302 273 lb (123.8 kg)     Height 12/10/2019 1302 5\' 6"  (1.676 m)     Head Circumference --  Peak Flow --      Pain Score 12/10/2019 1301 7     Pain Loc --      Pain Edu? --      Excl. in GC? --    Constitutional: Patient is awake alert oriented sitting upright in bed mild respiratory distress tachypneic with frequent coughing wearing a nonrebreather mask. Eyes: Normal exam ENT      Head: Normocephalic and atraumatic.      Mouth/Throat: Mucous membranes are moist. Cardiovascular: Regular rhythm rate around 120 bpm.  No obvious murmur. Respiratory: Moderate tachypnea appears to have overall clear lung sounds however difficult given significant cough during evaluation. Gastrointestinal: Soft and  nontender. No distention. Musculoskeletal: Nontender with normal range of motion in all extremities.  Neurologic:  Normal speech and language.  Moving all extremities well, no gross focal neurologic deficits  Skin:  Skin is warm, dry and intact.  Psychiatric: Mood and affect are normal.  ____________________________________________    EKG  EKG viewed and interpreted by myself shows sinus tachycardia at 99 bpm with a narrow QRS, right axis deviation, largely normal intervals with nonspecific ST changes.  ____________________________________________    RADIOLOGY  Chest x-ray reviewed by myself appears to show multifocal opacities.  IMPRESSION:  Diffuse bilateral airspace opacities - question edema versus  infection/pneumonia.   ____________________________________________   INITIAL IMPRESSION / ASSESSMENT AND PLAN / ED COURSE  Pertinent labs & imaging results that were available during my care of the patient were reviewed by me and considered in my medical decision making (see chart for details).   Patient presents to the emergency department for fever cough shortness of breath.  Patient found to be satting 56% on room air, currently satting 85 to 90% on a nonrebreather mask.  We will order high flow nasal cannula for the patient.  We will obtain a chest x-ray, check labs.  Highly suspect pneumonia versus Covid versus PE.  We will begin with labs blood cultures chest x-ray and IV fluids.  We will dose Tylenol and Toradol for fever control we will continue to closely monitor the patient.  I discussed possible intubation if needed patient is agreeable.  Patient will likely require CTA of the chest at some point as well.  Patient's labs show hypokalemia we will replete.  Lactic acidosis we will dose IV fluids.  Patient is doing much better on high flow nasal cannula currently satting between 96 to 97%.  I spoke to the intensive care unit we will be admitting to the ICU.  I have reviewed  the patient's chest x-ray images shows multifocal pneumonia which appears to be consistent with Covid presentation.  Given this finding I have ordered IV Decadron for the patient.  Awaiting official Covid results at this time.  Carrie Davies was evaluated in Emergency Department on 12/14/2019 for the symptoms described in the history of present illness. She was evaluated in the context of the global COVID-19 pandemic, which necessitated consideration that the patient might be at risk for infection with the SARS-CoV-2 virus that causes COVID-19. Institutional protocols and algorithms that pertain to the evaluation of patients at risk for COVID-19 are in a state of rapid change based on information released by regulatory bodies including the CDC and federal and state organizations. These policies and algorithms were followed during the patient's care in the ED.   CRITICAL CARE Performed by: Minna Antis   Total critical care time: 60 minutes  Critical care time was exclusive of separately billable  procedures and treating other patients.  Critical care was necessary to treat or prevent imminent or life-threatening deterioration.  Critical care was time spent personally by me on the following activities: development of treatment plan with patient and/or surrogate as well as nursing, discussions with consultants, evaluation of patient's response to treatment, examination of patient, obtaining history from patient or surrogate, ordering and performing treatments and interventions, ordering and review of laboratory studies, ordering and review of radiographic studies, pulse oximetry and re-evaluation of patient's condition.  ____________________________________________   FINAL CLINICAL IMPRESSION(S) / ED DIAGNOSES  Dyspnea Hypoxia   Minna Antis, MD 2020/01/22 1441

## 2019-12-26 NOTE — ED Notes (Signed)
When RN took primary RN role and assessed patient, pt noted to be on high flow nasal cannula at 15 lpm and nonrebreather at 15 lpm.

## 2019-12-26 NOTE — ED Notes (Signed)
Lab called, potassium is 2.7, lactic is 2.9. Provider notified.

## 2019-12-26 NOTE — ED Notes (Signed)
Meal tray provided to pt. Nonrebreather removed, pt SPO2 remains at 98-100% on 60L oxygen per HFLN.

## 2019-12-26 NOTE — Progress Notes (Addendum)
PHARMACIST - PHYSICIAN COMMUNICATION  CONCERNING:  Enoxaparin (Lovenox) for DVT Prophylaxis    RECOMMENDATION: Patient was prescribed enoxaparin 40mg  q24 hours for VTE prophylaxis.   Filed Weights   12/15/2019 1302  Weight: 123.8 kg (273 lb)    Body mass index is 44.06 kg/m.  Estimated Creatinine Clearance: 105.5 mL/min (by C-G formula based on SCr of 0.74 mg/dL).   Based on [facility] policy patient is candidate for enoxaparin 0.5mg /kg TBW SQ every 24 hours based on BMI being >30.  DESCRIPTION: Pharmacy has adjusted enoxaparin dose per [facility] policy.  Patient is now receiving enoxaparin 60 mg every 24 hours    [facility] 12/07/2019 5:17 PM

## 2019-12-27 ENCOUNTER — Inpatient Hospital Stay: Payer: Self-pay

## 2019-12-27 ENCOUNTER — Inpatient Hospital Stay: Payer: BC Managed Care – PPO

## 2019-12-27 DIAGNOSIS — J9600 Acute respiratory failure, unspecified whether with hypoxia or hypercapnia: Secondary | ICD-10-CM | POA: Diagnosis not present

## 2019-12-27 DIAGNOSIS — U071 COVID-19: Secondary | ICD-10-CM | POA: Diagnosis not present

## 2019-12-27 LAB — TRIGLYCERIDES: Triglycerides: 119 mg/dL (ref <150)

## 2019-12-27 LAB — BLOOD GAS, ARTERIAL
Acid-Base Excess: 1.6 mmol/L (ref 0.0–2.0)
Allens test (pass/fail): POSITIVE — AB
Bicarbonate: 32 mmol/L — ABNORMAL HIGH (ref 20.0–28.0)
FIO2: 100
MECHVT: 400 mL
O2 Saturation: 99.4 %
PEEP: 12 cmH2O
Patient temperature: 37
RATE: 20 resp/min
pCO2 arterial: 80 mmHg (ref 32.0–48.0)
pH, Arterial: 7.21 — ABNORMAL LOW (ref 7.350–7.450)
pO2, Arterial: 183 mmHg — ABNORMAL HIGH (ref 83.0–108.0)

## 2019-12-27 LAB — BASIC METABOLIC PANEL
Anion gap: 13 (ref 5–15)
BUN: 13 mg/dL (ref 6–20)
CO2: 24 mmol/L (ref 22–32)
Calcium: 8.2 mg/dL — ABNORMAL LOW (ref 8.9–10.3)
Chloride: 103 mmol/L (ref 98–111)
Creatinine, Ser: 0.62 mg/dL (ref 0.44–1.00)
GFR, Estimated: >60 mL/min (ref >60)
Glucose, Bld: 168 mg/dL — ABNORMAL HIGH (ref 70–99)
Potassium: 3.4 mmol/L — ABNORMAL LOW (ref 3.5–5.1)
Sodium: 140 mmol/L (ref 135–145)

## 2019-12-27 LAB — URINALYSIS, COMPLETE (UACMP) WITH MICROSCOPIC
Bilirubin Urine: NEGATIVE
Glucose, UA: NEGATIVE mg/dL
Ketones, ur: 20 mg/dL — AB
Leukocytes,Ua: NEGATIVE
Nitrite: NEGATIVE
Protein, ur: >=300 mg/dL — AB
Specific Gravity, Urine: 1.027 (ref 1.005–1.030)
pH: 6 (ref 5.0–8.0)

## 2019-12-27 LAB — HEPATIC FUNCTION PANEL
ALT: 62 U/L — ABNORMAL HIGH (ref 0–44)
AST: 110 U/L — ABNORMAL HIGH (ref 15–41)
Albumin: 3.3 g/dL — ABNORMAL LOW (ref 3.5–5.0)
Alkaline Phosphatase: 53 U/L (ref 38–126)
Bilirubin, Direct: 0.4 mg/dL — ABNORMAL HIGH (ref 0.0–0.2)
Indirect Bilirubin: 0.7 mg/dL (ref 0.3–0.9)
Total Bilirubin: 1.1 mg/dL (ref 0.3–1.2)
Total Protein: 7.6 g/dL (ref 6.5–8.1)

## 2019-12-27 LAB — CYTOLOGY - PAP
Comment: NEGATIVE
Diagnosis: NEGATIVE
High risk HPV: NEGATIVE

## 2019-12-27 LAB — HEMOGLOBIN A1C
Hgb A1c MFr Bld: 5.6 % (ref 4.8–5.6)
Mean Plasma Glucose: 114 mg/dL

## 2019-12-27 LAB — CBC
HCT: 43.4 % (ref 36.0–46.0)
Hemoglobin: 14.3 g/dL (ref 12.0–15.0)
MCH: 30.1 pg (ref 26.0–34.0)
MCHC: 32.9 g/dL (ref 30.0–36.0)
MCV: 91.4 fL (ref 80.0–100.0)
Platelets: 224 10*3/uL (ref 150–400)
RBC: 4.75 MIL/uL (ref 3.87–5.11)
RDW: 13.3 % (ref 11.5–15.5)
WBC: 11.3 10*3/uL — ABNORMAL HIGH (ref 4.0–10.5)
nRBC: 0 % (ref 0.0–0.2)

## 2019-12-27 LAB — C-REACTIVE PROTEIN: CRP: 16.7 mg/dL — ABNORMAL HIGH (ref <1.0)

## 2019-12-27 LAB — TSH: TSH: 0.959 u[IU]/mL (ref 0.350–4.500)

## 2019-12-27 LAB — GLUCOSE, CAPILLARY
Glucose-Capillary: 156 mg/dL — ABNORMAL HIGH (ref 70–99)
Glucose-Capillary: 161 mg/dL — ABNORMAL HIGH (ref 70–99)
Glucose-Capillary: 115 mg/dL — ABNORMAL HIGH (ref 70–99)
Glucose-Capillary: 124 mg/dL — ABNORMAL HIGH (ref 70–99)
Glucose-Capillary: 173 mg/dL — ABNORMAL HIGH (ref 70–99)

## 2019-12-27 LAB — T4, FREE: Free T4: 1.13 ng/dL — ABNORMAL HIGH (ref 0.61–1.12)

## 2019-12-27 LAB — STREP PNEUMONIAE URINARY ANTIGEN: Strep Pneumo Urinary Antigen: NEGATIVE

## 2019-12-27 LAB — MAGNESIUM: Magnesium: 2.3 mg/dL (ref 1.7–2.4)

## 2019-12-27 LAB — FERRITIN: Ferritin: 798 ng/mL — ABNORMAL HIGH (ref 11–307)

## 2019-12-27 LAB — FIBRIN DERIVATIVES D-DIMER (ARMC ONLY): Fibrin derivatives D-dimer (ARMC): 897.97 ng/mL (FEU) — ABNORMAL HIGH (ref 0.00–499.00)

## 2019-12-27 LAB — PHOSPHORUS: Phosphorus: 2.6 mg/dL (ref 2.5–4.6)

## 2019-12-27 LAB — TROPONIN I (HIGH SENSITIVITY): Troponin I (High Sensitivity): 42 ng/L — ABNORMAL HIGH (ref <18)

## 2019-12-27 LAB — PROCALCITONIN: Procalcitonin: <0.1 ng/mL

## 2019-12-27 MED ORDER — VECURONIUM BROMIDE 10 MG IV SOLR
10.0000 mg | Freq: Once | INTRAVENOUS | Status: AC
  Administered 2019-12-27: 10 mg via INTRAVENOUS
  Filled 2019-12-27: qty 10

## 2019-12-27 MED ORDER — MIDAZOLAM HCL 2 MG/2ML IJ SOLN
INTRAMUSCULAR | Status: AC
  Administered 2019-12-27: 2 mg via INTRAVENOUS
  Filled 2019-12-27: qty 4

## 2019-12-27 MED ORDER — INSULIN ASPART 100 UNIT/ML ~~LOC~~ SOLN
0.0000 [IU] | SUBCUTANEOUS | Status: DC
  Administered 2019-12-27 (×2): 4 [IU] via SUBCUTANEOUS
  Administered 2019-12-27: 3 [IU] via SUBCUTANEOUS
  Administered 2019-12-28: 4 [IU] via SUBCUTANEOUS
  Administered 2019-12-28 (×2): 3 [IU] via SUBCUTANEOUS
  Administered 2019-12-29: 4 [IU] via SUBCUTANEOUS
  Administered 2019-12-29: 3 [IU] via SUBCUTANEOUS
  Administered 2019-12-29 (×2): 4 [IU] via SUBCUTANEOUS
  Administered 2019-12-29 – 2019-12-30 (×4): 3 [IU] via SUBCUTANEOUS
  Administered 2019-12-30: 4 [IU] via SUBCUTANEOUS
  Administered 2019-12-30 (×3): 3 [IU] via SUBCUTANEOUS
  Administered 2019-12-31 (×2): 4 [IU] via SUBCUTANEOUS
  Administered 2019-12-31 – 2020-01-01 (×2): 3 [IU] via SUBCUTANEOUS
  Administered 2020-01-01 – 2020-01-02 (×2): 4 [IU] via SUBCUTANEOUS
  Administered 2020-01-02 (×3): 3 [IU] via SUBCUTANEOUS
  Administered 2020-01-03 – 2020-01-04 (×4): 4 [IU] via SUBCUTANEOUS
  Administered 2020-01-04: 3 [IU] via SUBCUTANEOUS
  Administered 2020-01-04: 2 [IU] via SUBCUTANEOUS
  Administered 2020-01-05 (×3): 4 [IU] via SUBCUTANEOUS
  Administered 2020-01-05 – 2020-01-06 (×6): 3 [IU] via SUBCUTANEOUS
  Administered 2020-01-06: 4 [IU] via SUBCUTANEOUS
  Administered 2020-01-06 – 2020-01-07 (×3): 3 [IU] via SUBCUTANEOUS
  Administered 2020-01-07 (×3): 4 [IU] via SUBCUTANEOUS
  Administered 2020-01-07 – 2020-01-08 (×2): 3 [IU] via SUBCUTANEOUS
  Administered 2020-01-08 – 2020-01-09 (×8): 4 [IU] via SUBCUTANEOUS
  Administered 2020-01-09: 3 [IU] via SUBCUTANEOUS
  Administered 2020-01-09 – 2020-01-10 (×2): 4 [IU] via SUBCUTANEOUS
  Administered 2020-01-10 (×3): 3 [IU] via SUBCUTANEOUS
  Administered 2020-01-10: 4 [IU] via SUBCUTANEOUS
  Administered 2020-01-11: 3 [IU] via SUBCUTANEOUS
  Administered 2020-01-11: 4 [IU] via SUBCUTANEOUS
  Administered 2020-01-11: 3 [IU] via SUBCUTANEOUS
  Administered 2020-01-11 (×3): 4 [IU] via SUBCUTANEOUS
  Administered 2020-01-11: 7 [IU] via SUBCUTANEOUS
  Administered 2020-01-12 – 2020-01-13 (×6): 4 [IU] via SUBCUTANEOUS
  Administered 2020-01-13: 7 [IU] via SUBCUTANEOUS
  Administered 2020-01-13 – 2020-01-14 (×7): 4 [IU] via SUBCUTANEOUS
  Administered 2020-01-14: 7 [IU] via SUBCUTANEOUS
  Administered 2020-01-14 (×3): 4 [IU] via SUBCUTANEOUS
  Administered 2020-01-15: 3 [IU] via SUBCUTANEOUS
  Administered 2020-01-15 (×2): 7 [IU] via SUBCUTANEOUS
  Administered 2020-01-15 – 2020-01-16 (×5): 4 [IU] via SUBCUTANEOUS
  Administered 2020-01-16: 7 [IU] via SUBCUTANEOUS
  Administered 2020-01-16: 3 [IU] via SUBCUTANEOUS
  Administered 2020-01-16 – 2020-01-17 (×3): 7 [IU] via SUBCUTANEOUS
  Administered 2020-01-17 (×3): 11 [IU] via SUBCUTANEOUS
  Administered 2020-01-17: 4 [IU] via SUBCUTANEOUS
  Administered 2020-01-18: 11 [IU] via SUBCUTANEOUS
  Administered 2020-01-18: 7 [IU] via SUBCUTANEOUS
  Administered 2020-01-18: 11 [IU] via SUBCUTANEOUS
  Administered 2020-01-18 (×3): 4 [IU] via SUBCUTANEOUS
  Administered 2020-01-19: 15 [IU] via SUBCUTANEOUS
  Administered 2020-01-19 (×3): 20 [IU] via SUBCUTANEOUS
  Administered 2020-01-19: 4 [IU] via SUBCUTANEOUS
  Filled 2019-12-27 (×123): qty 1

## 2019-12-27 MED ORDER — POLYETHYLENE GLYCOL 3350 17 G PO PACK
17.0000 g | PACK | Freq: Every day | ORAL | Status: DC
  Administered 2019-12-27: 17 g via ORAL
  Filled 2019-12-27: qty 1

## 2019-12-27 MED ORDER — ORAL CARE MOUTH RINSE
15.0000 mL | OROMUCOSAL | Status: DC
  Administered 2019-12-27 – 2020-01-19 (×227): 15 mL via OROMUCOSAL

## 2019-12-27 MED ORDER — VECURONIUM BROMIDE 10 MG IV SOLR
INTRAVENOUS | Status: AC
  Administered 2019-12-27: 10 mg
  Filled 2019-12-27: qty 10

## 2019-12-27 MED ORDER — FAMOTIDINE IN NACL 20-0.9 MG/50ML-% IV SOLN
20.0000 mg | Freq: Two times a day (BID) | INTRAVENOUS | Status: DC
  Administered 2019-12-27 (×2): 20 mg via INTRAVENOUS
  Filled 2019-12-27 (×2): qty 50

## 2019-12-27 MED ORDER — SODIUM CHLORIDE 0.9% FLUSH
10.0000 mL | INTRAVENOUS | Status: DC | PRN
  Administered 2019-12-27: 10 mL
  Administered 2020-01-03: 20 mL

## 2019-12-27 MED ORDER — POLYETHYLENE GLYCOL 3350 17 G PO PACK
17.0000 g | PACK | Freq: Every day | ORAL | Status: DC
  Administered 2019-12-28: 17 g
  Filled 2019-12-27: qty 1

## 2019-12-27 MED ORDER — FUROSEMIDE 10 MG/ML IJ SOLN
40.0000 mg | Freq: Once | INTRAMUSCULAR | Status: AC
  Administered 2019-12-27: 40 mg via INTRAVENOUS

## 2019-12-27 MED ORDER — VITAL HIGH PROTEIN PO LIQD: 1000.0000 mL | ORAL | Status: DC

## 2019-12-27 MED ORDER — ACETAMINOPHEN 325 MG PO TABS
650.0000 mg | ORAL_TABLET | Freq: Four times a day (QID) | ORAL | Status: DC | PRN
  Administered 2020-01-01 – 2020-01-18 (×18): 650 mg
  Filled 2019-12-27 (×18): qty 2

## 2019-12-27 MED ORDER — SODIUM CHLORIDE 0.9 % IV SOLN
250.0000 mL | INTRAVENOUS | Status: DC
  Administered 2019-12-28 – 2020-01-06 (×8): 250 mL via INTRAVENOUS

## 2019-12-27 MED ORDER — PROPOFOL 1000 MG/100ML IV EMUL
INTRAVENOUS | Status: AC
  Administered 2019-12-27: 20 ug/kg/min via INTRAVENOUS
  Filled 2019-12-27: qty 100

## 2019-12-27 MED ORDER — MIDAZOLAM HCL 2 MG/2ML IJ SOLN: 2.0000 mg | INTRAMUSCULAR | Status: DC | PRN

## 2019-12-27 MED ORDER — MIDAZOLAM HCL 2 MG/2ML IJ SOLN
2.0000 mg | Freq: Once | INTRAMUSCULAR | Status: AC
  Administered 2019-12-27: 2 mg via INTRAVENOUS

## 2019-12-27 MED ORDER — FENTANYL BOLUS VIA INFUSION
50.0000 ug | INTRAVENOUS | Status: DC | PRN
  Filled 2019-12-27: qty 50

## 2019-12-27 MED ORDER — SODIUM CHLORIDE 0.9 % IV SOLN
2.0000 g | Freq: Three times a day (TID) | INTRAVENOUS | Status: DC
  Administered 2019-12-27: 2 g via INTRAVENOUS
  Filled 2019-12-27 (×2): qty 2

## 2019-12-27 MED ORDER — VECURONIUM BROMIDE 10 MG IV SOLR
10.0000 mg | Freq: Once | INTRAVENOUS | Status: AC
  Administered 2019-12-27: 10 mg via INTRAVENOUS

## 2019-12-27 MED ORDER — MIDAZOLAM HCL 2 MG/2ML IJ SOLN
2.0000 mg | INTRAMUSCULAR | Status: AC | PRN
  Administered 2019-12-27 – 2020-01-01 (×3): 2 mg via INTRAVENOUS
  Filled 2019-12-27 (×5): qty 2

## 2019-12-27 MED ORDER — ACETAMINOPHEN 325 MG PO TABS: 650.0000 mg | ORAL_TABLET | Freq: Four times a day (QID) | ORAL | Status: DC | PRN

## 2019-12-27 MED ORDER — VITAL AF 1.2 CAL PO LIQD
1000.0000 mL | ORAL | Status: DC
  Administered 2019-12-27 – 2020-01-01 (×4): 1000 mL

## 2019-12-27 MED ORDER — FENTANYL CITRATE (PF) 100 MCG/2ML IJ SOLN
100.0000 ug | Freq: Once | INTRAMUSCULAR | Status: AC
  Administered 2019-12-27: 100 ug via INTRAVENOUS

## 2019-12-27 MED ORDER — PROSOURCE TF PO LIQD
90.0000 mL | Freq: Four times a day (QID) | ORAL | Status: DC
  Administered 2019-12-27 – 2020-01-04 (×32): 90 mL
  Filled 2019-12-27 (×35): qty 90

## 2019-12-27 MED ORDER — NOREPINEPHRINE 4 MG/250ML-% IV SOLN
2.0000 ug/min | INTRAVENOUS | Status: DC
  Administered 2019-12-27: 2 ug/min via INTRAVENOUS
  Administered 2019-12-28: 4 ug/min via INTRAVENOUS
  Filled 2019-12-27 (×2): qty 250

## 2019-12-27 MED ORDER — PANTOPRAZOLE SODIUM 40 MG IV SOLR
40.0000 mg | Freq: Two times a day (BID) | INTRAVENOUS | Status: DC
  Administered 2019-12-28 – 2020-01-12 (×32): 40 mg via INTRAVENOUS
  Filled 2019-12-27 (×32): qty 40

## 2019-12-27 MED ORDER — GUAIFENESIN-CODEINE 100-10 MG/5ML PO SOLN
10.0000 mL | Freq: Four times a day (QID) | ORAL | Status: DC | PRN
  Administered 2019-12-27: 10 mL via ORAL
  Filled 2019-12-27: qty 10

## 2019-12-27 MED ORDER — VECURONIUM BROMIDE 10 MG IV SOLR
10.0000 mg | INTRAVENOUS | Status: DC | PRN
  Administered 2019-12-28 – 2020-01-01 (×12): 10 mg via INTRAVENOUS
  Filled 2019-12-27 (×12): qty 10

## 2019-12-27 MED ORDER — CHLORHEXIDINE GLUCONATE CLOTH 2 % EX PADS
6.0000 | MEDICATED_PAD | Freq: Every day | CUTANEOUS | Status: AC
  Administered 2019-12-28 – 2019-12-29 (×2): 6 via TOPICAL

## 2019-12-27 MED ORDER — SODIUM CHLORIDE 0.9% FLUSH
10.0000 mL | Freq: Two times a day (BID) | INTRAVENOUS | Status: DC
  Administered 2019-12-27: 10 mL
  Administered 2019-12-28: 20 mL
  Administered 2019-12-28 – 2019-12-31 (×7): 10 mL
  Administered 2020-01-01: 20 mL
  Administered 2020-01-01 – 2020-01-06 (×11): 10 mL

## 2019-12-27 MED ORDER — CHLORHEXIDINE GLUCONATE 0.12% ORAL RINSE (MEDLINE KIT)
15.0000 mL | Freq: Two times a day (BID) | OROMUCOSAL | Status: DC
  Administered 2019-12-27 – 2020-01-18 (×45): 15 mL via OROMUCOSAL

## 2019-12-27 MED ORDER — PROPOFOL 1000 MG/100ML IV EMUL
0.0000 ug/kg/min | INTRAVENOUS | Status: DC
  Administered 2019-12-27: 40 ug/kg/min via INTRAVENOUS
  Administered 2019-12-27: 50 ug/kg/min via INTRAVENOUS
  Administered 2019-12-27 (×2): 40 ug/kg/min via INTRAVENOUS
  Administered 2019-12-28 (×9): 50 ug/kg/min via INTRAVENOUS
  Administered 2019-12-29 (×2): 30 ug/kg/min via INTRAVENOUS
  Administered 2019-12-29 (×2): 40 ug/kg/min via INTRAVENOUS
  Administered 2019-12-29: 35 ug/kg/min via INTRAVENOUS
  Administered 2019-12-29 (×2): 40 ug/kg/min via INTRAVENOUS
  Administered 2019-12-30: 50 ug/kg/min via INTRAVENOUS
  Administered 2019-12-30: 40 ug/kg/min via INTRAVENOUS
  Administered 2019-12-30: 50 ug/kg/min via INTRAVENOUS
  Administered 2019-12-30: 40 ug/kg/min via INTRAVENOUS
  Administered 2019-12-30: 50 ug/kg/min via INTRAVENOUS
  Administered 2019-12-30: 40 ug/kg/min via INTRAVENOUS
  Administered 2019-12-30 – 2019-12-31 (×7): 50 ug/kg/min via INTRAVENOUS
  Administered 2019-12-31 (×2): 40 ug/kg/min via INTRAVENOUS
  Administered 2019-12-31: 50 ug/kg/min via INTRAVENOUS
  Administered 2019-12-31: 40 ug/kg/min via INTRAVENOUS
  Administered 2020-01-01 (×3): 50 ug/kg/min via INTRAVENOUS
  Administered 2020-01-01: 26.925 ug/kg/min via INTRAVENOUS
  Administered 2020-01-01 – 2020-01-02 (×7): 50 ug/kg/min via INTRAVENOUS
  Administered 2020-01-02: 40 ug/kg/min via INTRAVENOUS
  Administered 2020-01-02: 50 ug/kg/min via INTRAVENOUS
  Administered 2020-01-02: 30 ug/kg/min via INTRAVENOUS
  Administered 2020-01-02: 45 ug/kg/min via INTRAVENOUS
  Administered 2020-01-02: 49.946 ug/kg/min via INTRAVENOUS
  Administered 2020-01-02: 45 ug/kg/min via INTRAVENOUS
  Administered 2020-01-03: 30 ug/kg/min via INTRAVENOUS
  Administered 2020-01-03: 25 ug/kg/min via INTRAVENOUS
  Administered 2020-01-03: 30 ug/kg/min via INTRAVENOUS
  Administered 2020-01-03: 15 ug/kg/min via INTRAVENOUS
  Filled 2019-12-27 (×58): qty 100

## 2019-12-27 MED ORDER — FENTANYL 2500MCG IN NS 250ML (10MCG/ML) PREMIX INFUSION
INTRAVENOUS | Status: AC
  Filled 2019-12-27: qty 250

## 2019-12-27 MED ORDER — DOCUSATE SODIUM 50 MG/5ML PO LIQD
100.0000 mg | Freq: Two times a day (BID) | ORAL | Status: DC
  Administered 2019-12-27: 100 mg via ORAL
  Filled 2019-12-27: qty 10

## 2019-12-27 MED ORDER — GUAIFENESIN-CODEINE 100-10 MG/5ML PO SOLN: 10.0000 mL | Freq: Four times a day (QID) | ORAL | Status: DC | PRN

## 2019-12-27 MED ORDER — POLYETHYLENE GLYCOL 3350 17 G PO PACK
17.0000 g | PACK | Freq: Every day | ORAL | Status: DC | PRN
  Administered 2020-01-13: 17 g
  Filled 2019-12-27: qty 1

## 2019-12-27 MED ORDER — POTASSIUM CHLORIDE CRYS ER 20 MEQ PO TBCR
40.0000 meq | EXTENDED_RELEASE_TABLET | Freq: Once | ORAL | Status: AC
  Administered 2019-12-27: 40 meq via ORAL
  Filled 2019-12-27: qty 2

## 2019-12-27 MED ORDER — DOCUSATE SODIUM 50 MG/5ML PO LIQD
100.0000 mg | Freq: Two times a day (BID) | ORAL | Status: DC
  Administered 2019-12-27 – 2019-12-28 (×3): 100 mg
  Filled 2019-12-27 (×3): qty 10

## 2019-12-27 MED ORDER — DOCUSATE SODIUM 50 MG/5ML PO LIQD
100.0000 mg | Freq: Two times a day (BID) | ORAL | Status: DC | PRN
  Administered 2020-01-13: 100 mg
  Filled 2019-12-27: qty 10

## 2019-12-27 MED ORDER — ETOMIDATE 2 MG/ML IV SOLN
20.0000 mg | Freq: Once | INTRAVENOUS | Status: AC
  Administered 2019-12-27: 20 mg via INTRAVENOUS

## 2019-12-27 MED ORDER — MUPIROCIN 2 % EX OINT
1.0000 "application " | TOPICAL_OINTMENT | Freq: Two times a day (BID) | CUTANEOUS | Status: AC
  Administered 2019-12-27 – 2019-12-31 (×10): 1 via NASAL
  Filled 2019-12-27: qty 22

## 2019-12-27 MED ORDER — ACETAMINOPHEN 325 MG PO TABS
ORAL_TABLET | ORAL | Status: AC
  Administered 2019-12-27: 650 mg via ORAL
  Filled 2019-12-27: qty 2

## 2019-12-27 MED ORDER — FENTANYL CITRATE (PF) 100 MCG/2ML IJ SOLN
50.0000 ug | Freq: Once | INTRAMUSCULAR | Status: DC
  Filled 2019-12-27: qty 2

## 2019-12-27 MED ORDER — ROCURONIUM BROMIDE 50 MG/5ML IV SOLN
50.0000 mg | Freq: Once | INTRAVENOUS | Status: AC
  Administered 2019-12-27: 50 mg via INTRAVENOUS

## 2019-12-27 MED ORDER — FENTANYL 2500MCG IN NS 250ML (10MCG/ML) PREMIX INFUSION
0.0000 ug/h | INTRAVENOUS | Status: DC
  Administered 2019-12-27: 50 ug/h via INTRAVENOUS
  Administered 2019-12-27: 200 ug/h via INTRAVENOUS
  Administered 2019-12-28: 350 ug/h via INTRAVENOUS
  Filled 2019-12-27 (×2): qty 250

## 2019-12-27 NOTE — Progress Notes (Signed)
RT aware of cuff leak from day shift, instilled more air into cuff. Patient now getting her volumes.

## 2019-12-27 NOTE — Progress Notes (Signed)
RT NOTE: received patient from ed. Intubated and bagged by RT.  Patient was initially on peep of 20 due to low sats in ed.  Recruitment maneuver performed when patient arrived. PC 40 R 10 Peep 5 100% x 2 min per NP, [name]. Patient tolerated interventions well. O2 sats noted to be in mid 90s.  Shortly after patient had to be reintubated due to blown cuff from most likely excessive peep.  Patient tolerated intubation well. 7.5 et tube 22 at lip. placed patient on vent 400vt r20 peep 12 100%.  Per NP, [name].

## 2019-12-27 NOTE — Progress Notes (Signed)
Follow up - Critical Care Medicine Note  Patient Details:    Carrie Davies is an 54 y.o. female with no aj major chronic medical issues, remote former smoker admitted on 26 December 2019 with acute respiratory failure with hypoxia due to COVID-19 pneumonia.  Admitted to stepdown status initially.  Lines, Airways, Drains: Airway 7.5 mm (Active)  Secured at (cm) 22 cm 12/27/19 2004  Measured From Lips 12/27/19 2004  Secured Location Right 12/27/19 2004  Secured By [employer] 12/27/19 2004  Tube Holder Repositioned Yes 12/27/19 2004  Cuff Pressure (cm H2O) 26 cm H2O 12/27/19 2004  Site Condition Dry 12/27/19 2004     PICC Double Lumen 28/76/81 PICC Right Basilic 40 cm 0 cm (Active)  Exposed Catheter (cm) 0 cm 12/27/19 1442  Site Assessment Clean;Dry;Intact 12/27/19 1442  Lumen #1 Status Flushed;Blood return noted;Saline locked 12/27/19 1442  Lumen #2 Status Flushed;Blood return noted;Saline locked 12/27/19 1442  Dressing Type Transparent;Securing device 12/27/19 1442  Dressing Status Clean;Dry;Intact 12/27/19 1442  Antimicrobial disc in place? Yes 12/27/19 1442  Safety Lock Not Applicable 15/72/62 0355  Dressing Change Due 01/03/20 12/27/19 1442     NG/OG Tube Orogastric 18 Fr. Center mouth Aucultation 60 cm (Active)  Cm Marking at Nare/Corner of Mouth (if applicable) 59 cm 97/41/63 1920  Site Assessment Clean;Dry;Intact 12/27/19 1920  Ongoing Placement Verification No change in cm markings or external length of tube from initial placement;No change in respiratory status;No acute changes, not attributed to clinical condition 12/27/19 1920  Status Infusing tube feed 12/27/19 1920     Urethral Catheter Miquel Dunn RN Temperature probe 14 Fr. (Active)  Indication for Insertion or Continuance of Catheter Therapy based on hourly urine output monitoring and documentation for critical condition (NOT STRICT I&O) 12/27/19 1920  Site Assessment Clean;Intact 12/27/19 1920  Catheter  Maintenance Bag below level of bladder;Catheter secured;Drainage bag/tubing not touching floor;Insertion date on drainage bag;No dependent loops;Bag emptied prior to transport 12/27/19 1920  Collection Container Standard drainage bag 12/27/19 1920  Securement Method Securing device (Describe) 12/27/19 1920  Output (mL) 250 mL 12/27/19 2205    Anti-infectives:  Anti-infectives (From admission, onward)   Start     Dose/Rate Route Frequency Ordered Stop   12/27/19 1000  remdesivir 100 mg in sodium chloride 0.9 % 100 mL IVPB  Status:  Discontinued       "Followed by" Linked Group Details   100 mg 200 mL/hr over 30 Minutes Intravenous Daily 12/06/2019 1422 12/11/2019 1459   12/27/19 0600  ceFEPIme (MAXIPIME) 2 g in sodium chloride 0.9 % 100 mL IVPB  Status:  Discontinued        2 g 200 mL/hr over 30 Minutes Intravenous Every 8 hours 12/27/19 0553 12/27/19 1412   12/06/2019 1530  remdesivir 200 mg in sodium chloride 0.9% 250 mL IVPB  Status:  Discontinued       "Followed by" Linked Group Details   200 mg 580 mL/hr over 30 Minutes Intravenous Once 12/29/2019 1422 12/10/2019 1459     Scheduled Meds: . chlorhexidine gluconate (MEDLINE KIT)  15 mL Mouth Rinse BID  . Chlorhexidine Gluconate Cloth  6 each Topical Q0600  . dexamethasone (DECADRON) injection  10 mg Intravenous Q24H  . docusate  100 mg Per Tube BID  . enoxaparin (LOVENOX) injection  60 mg Subcutaneous Q24H  . feeding supplement (PROSource TF)  90 mL Per Tube QID  . feeding supplement (VITAL AF 1.2 CAL)  1,000 mL Per Tube Q24H  . fentaNYL (  SUBLIMAZE) injection  50 mcg Intravenous Once  . insulin aspart  0-20 Units Subcutaneous Q4H  . mouth rinse  15 mL Mouth Rinse 10 times per day  . mupirocin ointment  1 application Nasal BID  . [START ON 12/28/2019] polyethylene glycol  17 g Per Tube Daily  . sodium chloride flush  10-40 mL Intracatheter Q12H   Continuous Infusions: . sodium chloride    . famotidine (PEPCID) IV Stopped (12/27/19 2145)   . fentaNYL infusion INTRAVENOUS 200 mcg/hr (12/27/19 2200)  . norepinephrine (LEVOPHED) Adult infusion 4 mcg/min (12/27/19 2200)  . propofol (DIPRIVAN) infusion 40 mcg/kg/min (12/27/19 2205)   PRN Meds:.acetaminophen, docusate, fentaNYL, guaiFENesin-codeine, midazolam, midazolam, ondansetron (ZOFRAN) IV, polyethylene glycol, sodium chloride flush, vecuronium    Microbiology: Results for orders placed or performed during the hospital encounter of 12/15/2019  Blood Culture (routine x 2)     Status: None (Preliminary result)   Collection Time: 12/11/2019  1:29 PM   Specimen: BLOOD  Result Value Ref Range Status   Specimen Description BLOOD LEFT ANTECUBITAL  Final   Special Requests   Final    BOTTLES DRAWN AEROBIC AND ANAEROBIC Blood Culture adequate volume   Culture   Final    NO GROWTH < 24 HOURS Performed at [facility], [address], [place], [place] [address]    Report Status PENDING  Incomplete  Respiratory Panel by RT PCR (Flu A&B, Covid) - Nasopharyngeal Swab     Status: Abnormal   Collection Time: 12/14/2019  1:30 PM   Specimen: Nasopharyngeal Swab  Result Value Ref Range Status   SARS Coronavirus 2 by RT PCR POSITIVE (A) NEGATIVE Final    Comment: RESULT CALLED TO, READ BACK BY AND VERIFIED WITH: KATE BUMGARNER ON 12/19/2019 AT 7353 TIK (NOTE) SARS-CoV-2 target nucleic acids are DETECTED.  SARS-CoV-2 RNA is generally detectable in upper respiratory specimens  during the acute phase of infection. Positive results are indicative of the presence of the identified virus, but do not rule out bacterial infection or co-infection with other pathogens not detected by the test. Clinical correlation with patient history and other diagnostic information is necessary to determine patient infection status. The expected result is Negative.  Fact Sheet for Patients:  PinkCheek.be  Fact Sheet for Healthcare  Providers: GravelBags.it  This test is not yet approved or cleared by the [place] FDA and  has been authorized for detection and/or diagnosis of SARS-CoV-2 by FDA under an Emergency Use Authorization (EUA).  This EUA will remain in effect (meaning this test c an be used) for the duration of  the COVID-19 declaration under Section 564(b)(1) of the Act, 21 U.S.C. section 360bbb-3(b)(1), unless the authorization is terminated or revoked sooner.      Influenza A by PCR NEGATIVE NEGATIVE Final   Influenza B by PCR NEGATIVE NEGATIVE Final    Comment: (NOTE) The Xpert Xpress SARS-CoV-2/FLU/RSV assay is intended as an aid in  the diagnosis of influenza from Nasopharyngeal swab specimens and  should not be used as a sole basis for treatment. Nasal washings and  aspirates are unacceptable for Xpert Xpress SARS-CoV-2/FLU/RSV  testing.  Fact Sheet for Patients: PinkCheek.be  Fact Sheet for Healthcare Providers: GravelBags.it  This test is not yet approved or cleared by the [place] FDA and  has been authorized for detection and/or diagnosis of SARS-CoV-2 by  FDA under an Emergency Use Authorization (EUA). This EUA will remain  in effect (meaning this test can be used) for the duration of  the  Covid-19 declaration under Section 564(b)(1) of the Act, 21  U.S.C. section 360bbb-3(b)(1), unless the authorization is  terminated or revoked. Performed at [facility], [address], [place], [place] [address]   Blood Culture (routine x 2)     Status: None (Preliminary result)   Collection Time: 12/24/2019  1:34 PM   Specimen: BLOOD  Result Value Ref Range Status   Specimen Description BLOOD RIGHT ANTECUBITAL  Final   Special Requests   Final    BOTTLES DRAWN AEROBIC AND ANAEROBIC Blood Culture results may not be optimal due to an excessive volume of blood received in culture bottles    Culture   Final    NO GROWTH < 24 HOURS Performed at [facility], [address], [place], [place] [address]    Report Status PENDING  Incomplete    Best Practice/Protocols:  VTE Prophylaxis: Lovenox (prophylaxtic dose) GI Prophylaxis: Proton Pump Inhibitor Continous Sedation ARDS Hyperglycemia (ICU) VAP protocol   Events: 12/25/2019 admitted as stepdown status with acute respiratory failure due to COVID-19 viral infection/pneumonia on high flow O2 12/27/2019-intubated to progressive respiratory failure  Studies: DG Chest Port 1 View  Result Date: 12/27/2019 CLINICAL DATA:  COVID positive. EXAM: PORTABLE CHEST 1 VIEW COMPARISON:  12/27/2019, earlier same day FINDINGS: 0712 hours. Endotracheal tube tip is 4.5 cm above the base of the carina. The NG tube passes into the stomach although the distal tip position is not included on the film. Cardiopericardial silhouette is at upper limits of normal for size. Interval improvement in the bilateral diffuse airspace disease. No appreciable pleural effusion. No pneumothorax. Telemetry leads overlie the chest. IMPRESSION: Interval improvement in the bilateral diffuse airspace disease. Electronically Signed   By: Misty Stanley M.D.   On: 12/27/2019 07:24   DG Chest Portable 1 View  Result Date: 12/27/2019 CLINICAL DATA:  Intubation EXAM: PORTABLE CHEST 1 VIEW COMPARISON:  Yesterday FINDINGS: Endotracheal tube with tip at the clavicular heads. The enteric tube reaches the stomach. Dense and progressive bilateral chest opacification with limited aerated lung. Large appearance of the heart but largely obscured. No visible air leak. IMPRESSION: 1. Unremarkable hardware positioning. 2. Severe generalized opacification chest. Electronically Signed   By: Monte Fantasia M.D.   On: 12/27/2019 05:38   DG Chest Port 1 View  Result Date: 12/08/2019 CLINICAL DATA:  Shortness of breath and sepsis. EXAM: PORTABLE CHEST 1 VIEW COMPARISON:  None.  FINDINGS: The cardiomediastinal silhouette is unremarkable. Diffuse bilateral airspace opacities are noted. No pneumothorax or large pleural effusion identified. No acute bony abnormalities are present. IMPRESSION: Diffuse bilateral airspace opacities - question edema versus infection/pneumonia. Electronically Signed   By: Margarette Canada M.D.   On: 12/06/2019 14:23   [place] EKG SITE RITE  Result Date: 12/27/2019 If Site Rite image not attached, placement could not be confirmed due to current cardiac rhythm.   Chest x-ray performed today after intubation, independently reviewed:    Subjective:    Overnight Issues: Worsening respiratory distress and hypoxemia required intubation and mechanical ventilation.  Objective:  Vital signs for last 24 hours: Temp:  [98.4 F (36.9 C)-100.6 F (38.1 C)] 99 F (37.2 C) (10/25 2200) Pulse Rate:  [65-123] 76 (10/25 2200) Resp:  [7-56] 15 (10/25 2200) BP: (73-224)/(56-136) 100/65 (10/25 2200) SpO2:  [80 %-100 %] 95 % (10/25 2200) FiO2 (%):  [70 %-100 %] 70 % (10/25 2004)  Hemodynamic parameters for last 24 hours:    Intake/Output from previous day: 10/24 0701 -  10/25 0700 In: 2100 [IV Piggyback:2100] Out: 600 [Urine:600]  Intake/Output this shift: Total I/O In: 259.4 [I.V.:209.4; IV Piggyback:50] Out: 325 [Urine:325]  Vent settings for last 24 hours: Vent Mode: PRVC FiO2 (%):  [70 %-100 %] 70 % Set Rate:  [20 bmp] 20 bmp Vt Set:  [400 mL] 400 mL PEEP:  [12 cmH20] 12 cmH20 Plateau Pressure:  [28 cmH20] 28 cmH20  Physical Exam:   physical examination is limited due to need for PPE/CAPR GENERAL: Obese woman, intubated, mechanically ventilated  HEAD: Normocephalic, atraumatic.  EYES: Pupils equal, round, reactive to light.  No scleral icterus.  MOUTH: Orotracheally intubated, OG in place. NECK: Supple. No thyromegaly. Trachea midline. No JVD.  No adenopathy. PULMONARY: Coarse breath sounds Symmetrical air entry cannot assess further due to  CAPR. CARDIOVASCULAR: Monitor shows regular rate and rhythm.  ABDOMEN: Obese, soft, nondistended. MUSCULOSKELETAL: No joint deformity, no clubbing, no edema.  NEUROLOGIC: Sedated on vent. SKIN: Intact,warm,dry.  No overt rash noted.  No skin breakdown on sacral area noted.  Assessment/Plan:   Acute respiratory failure with hypoxia due to COVID-19 infection COVID-19 pneumonia Patient has progressed to intubation and mechanical ventilation ARDS protocol IV dexamethasone Supportive care Trend D-dimer and procalcitonin Antibiotics if procalcitonin elevated otherwise hold Continue to monitor in ICU Proning if necessary Continue to trend inflammatory markers Ventilator settings adjusted and discussed with RT  Hypokalemia New to replete  Hyperglycemia  Likely due to steroids ICU hyperglycemia protocol  Lactic acidosis Resolved with oxygen supplementation Trend lactic acid    LOS: 1 day   Additional comments: Multidisciplinary rounds were performed with the ICU team.  Patient's husband updated by Darel Hong, NP via phone.   Critical Care Total Time*: 45 Minutes   C. Derrill Kay, MD [facility] 12/27/2019  *Care during the described time interval was provided by me and/or other providers on the critical care team.  I have reviewed this patient's available data, including medical history, events of note, physical examination and test results as part of my evaluation.  **This note was dictated using voice recognition software/Dragon.  Despite best efforts to proofread, errors can occur which can change the meaning.  Any change was purely unintentional.

## 2019-12-27 NOTE — ED Notes (Signed)
Critical Care nurse practitioner at bedside.

## 2019-12-27 NOTE — ED Notes (Signed)
Pt taken off ventilator. Rt manually bagging patient.

## 2019-12-27 NOTE — ED Notes (Signed)
Pt taken to ICU by this RN, Irving Burton, RN and Respiratory Therapy. Pt was on stretcher with propofol and fentanyl infusing. Please see MAR. Pt was on stretcher with cardiac, bp and pulse ox monitoring.  RT manually bagged pt with ambu bag while transporting pt to the ICU.

## 2019-12-27 NOTE — ED Notes (Signed)
Pt is intubated, manual bagging of pt at this time by RT

## 2019-12-27 NOTE — Progress Notes (Signed)
Was notified by nursing that pt suffered desaturation episode earlier when NRB mask taken off for medication administration.  Pt's O2 sats have recovered to 93-97% with NRB + 100% HFNC.  However pt's work of breathing is concerning as RR 38-45, and pt with severe accessory muscle use and only able to speak in short phrases.  Pt also reports "I am getting tired of breathing."  Discussed with pt concern for her work of breathing and concern for potential impending respiratory arrest, and need for intubation.  She is in agreement with intubation.  Have attempt to reach pt's husband via telephone to update him on her decline and need for intubation.  Unable to reach him, voice message left for call back.  Have updated respiratory therapy and pt's nurse.  Will proceed with intubation.     Harlon Ditty, AGACNP-BC [facility] Pager: [phone]

## 2019-12-27 NOTE — ED Notes (Signed)
Provider notified "patient is on 15 lpm high flow nasal cannula and 15 lpm nonrebreather and has been since prior shift. When we took patient off nonrebreather to give cough medicine and water she desatted to 87%. She still had on the high flow nasal cannula. Pt is currently saturating at 89% on both high flow and nonrebreather. "

## 2019-12-27 NOTE — Progress Notes (Signed)
After arrival from ED, pt noted to have severe cuff leak, unable to obtain expiratory volumes and O2 sats decreasing to the 80's.   Will exchange out ETT.     Harlon Ditty, AGACNP-BC [facility] Pager: [phone]

## 2019-12-27 NOTE — ED Notes (Signed)
X-ray at bedside. At 0518 pt had to be bagged by hand. Pt now back on ventilator.

## 2019-12-27 NOTE — ED Notes (Signed)
RN talked with critical care NP via secure chat. Pt's oxygen is currently 93-95% on high flow [place] and nonrebreather at 15 lpm each as per NP, continue current treatment and he will come see pt shortly. Provider also notified of patient's elevated respiratory rate.

## 2019-12-27 NOTE — Progress Notes (Addendum)
Unable to maintain O2 saturations when placed on vent (able to maintain O2 sats 92-95% while bagging).  CXR shows good placement of ETT, but with progression of ARDS.  Will give dose of Lasix and place on empiric Cefepime.  Pt needs proning and recruitment maneuvers on vent.  Goals RASS of -3 to -4, with PRN Vecuronium for vent dyschrony. Have discussed with ICU charge nurse, and pt will be transferred to ICU very shortly.  Discussed with Dr. Jayme Cloud for any further suggestions/recommendations to assist with oxygenation.  She is in agreement with all of the stated measures.     Carrie Davies, AGACNP-BC [facility] Pager: [phone]

## 2019-12-27 NOTE — ED Notes (Signed)
Pt desaturated to 66% on ventilator. Ventilator removed and pt started to hand bag pt with ambu bag.

## 2019-12-27 NOTE — ED Notes (Signed)
Pt switched to ventilator at this time.

## 2019-12-27 NOTE — Procedures (Signed)
Intubation Procedure Note  Carrie Davies  [id number]  01/19/1966  Date:12/27/19  Time:6:55 AM   Provider Performing:Nicholaos Schippers D [name]    Procedure: Intubation (31500)  Indication(s) Respiratory Failure  Consent Unable to obtain consent due to emergent nature of procedure.   Anesthesia Versed, Fentanyl and Vecuronium   Time Out Verified patient identification, verified procedure, site/side was marked, verified correct patient position, special equipment/implants available, medications/allergies/relevant history reviewed, required imaging and test results available.   Sterile Technique Usual hand hygeine, masks, and gloves were used   Procedure Description Patient positioned in bed supine.  Sedation given as noted above.  Patient was intubated with endotracheal tube using Glidescope.  View was Grade 1 full glottis .  Number of attempts was 1.  Colorimetric CO2 detector was consistent with tracheal placement.   Complications/Tolerance None; patient tolerated the procedure well. Chest X-ray is ordered to verify placement.   EBL Minimal   Specimen(s) None   7.5 ETT Tube secured at 22 cm at the lip  Pt was extubated and re-intubated due to severe cuff leak and pt unable to maintain expiratory volumes.     Harlon Ditty, AGACNP-BC [facility] Pager: [phone]

## 2019-12-27 NOTE — Progress Notes (Signed)
Patient has an audible air leak, with low minute volumes, maintaining SATs >then 90. Call to RT to access.

## 2019-12-27 NOTE — Progress Notes (Signed)
Peripherally Inserted Central Catheter Placement  The IV Nurse has discussed with the patient and/or persons authorized to consent for the patient, the purpose of this procedure and the potential benefits and risks involved with this procedure.  The benefits include less needle sticks, lab draws from the catheter, and the patient may be discharged home with the catheter. Risks include, but not limited to, infection, bleeding, blood clot (thrombus formation), and puncture of an artery; nerve damage and irregular heartbeat and possibility to perform a PICC exchange if needed/ordered by physician.  Alternatives to this procedure were also discussed.  Bard Power PICC patient education guide, fact sheet on infection prevention and patient information card has been provided to patient /or left at bedside.    PICC Placement Documentation  PICC Double Lumen 12/27/19 PICC Right Basilic 40 cm 0 cm (Active)  Exposed Catheter (cm) 0 cm 12/27/19 1442  Site Assessment Clean;Dry;Intact 12/27/19 1442  Lumen #1 Status Flushed;Blood return noted;Saline locked 12/27/19 1442  Lumen #2 Status Flushed;Blood return noted;Saline locked 12/27/19 1442  Dressing Type Transparent;Securing device 12/27/19 1442  Dressing Status Clean;Dry;Intact 12/27/19 1442  Antimicrobial disc in place? Yes 12/27/19 1442  Safety Lock Not Applicable 12/27/19 1442  Dressing Change Due 01/03/20 12/27/19 1442       Carrie Davies 12/27/2019, 2:45 PM

## 2019-12-27 NOTE — ED Notes (Signed)
Pt moved to ventilator at this time.

## 2019-12-27 NOTE — ED Notes (Signed)
RT and critical care NP at bedside along with 2 RNs. Pt to be intubated. Suction at bedside. Ambu bag at bedside along with other necessary equipment for intubation.

## 2019-12-27 NOTE — ED Notes (Signed)
Pt is still being bagged by RT with ambu bag. Charge RN in the ED notified that pt is being bagged and when on ventilator her oxygen drops. This RN and Irving Burton, RN attempted multiple times to get a 3rd IV. Unable to obtain 3rd IV site at this time. Catheters intact, bleeding controlled.

## 2019-12-27 NOTE — Progress Notes (Signed)
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

## 2019-12-27 NOTE — Progress Notes (Signed)
Initial Nutrition Assessment  DOCUMENTATION CODES:   Morbid obesity  INTERVENTION:  Initiate Vital AF 1.2 Cal at 20 mL/hr (480 mL goal daily volume) + PROSource TF 90 mL QID per tube. Provides 896 kcal, 124 grams of protein, 389 mL H2O daily. With current propofol rate provides 1680 kcal daily.  Provide MVI daily per tube.  NUTRITION DIAGNOSIS:   Inadequate oral intake related to inability to eat as evidenced by NPO status.  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   10/25 intubated  Patient is currently intubated on ventilator support MV: 6.3 L/min Temp (24hrs), Avg:98.9 F (37.2 C), Min:98.4 F (36.9 C), Max:99.7 F (37.6 C)  Propofol: 29.7 ml/hr (784 kcal daily)  Medications reviewed and include: decadron 10 mg Q24hrs IV, Colace 100 mg BID per tube, Novolog 0-20 units Q4hrs, Miraalx 17 grams daily, cefepime, famotidine, fentanyl gtt, propofol gtt.  Labs reviewed: CBG 115-161, Potassium 3.4.  I/O: 600 mL UOP overnight  Enteral Access: 18 Fr. OGT placed 10/25; tube reaches the stomach per chest x-ray 10/25  Discussed with RN and on rounds. Plan is to start tube feeds today.  NUTRITION - FOCUSED PHYSICAL EXAM:  Deferred.  Diet Order:   Diet Order            Diet NPO time specified  Diet effective now                EDUCATION NEEDS:   No education needs have been identified at this time  Skin:  Skin Assessment:  (skin assessment not documented at this time)  Last BM:  Unknown  Height:   Ht Readings from Last 1 Encounters:  Jan 09, 2020 5\' 6"  (1.676 m)   Weight:   Wt Readings from Last 1 Encounters:  January 09, 2020 123.8 kg   Ideal Body Weight:   59.1 kg  BMI:  Body mass index is 44.06 kg/m.  Estimated Nutritional Needs:   Kcal:  1700  Protein:  124-134 grams  Fluid:  >/= 2 L/day  12/28/19, MS, RD, LDN Pager number available on Amion

## 2019-12-27 NOTE — ED Notes (Signed)
Pt taken off of ventilator and is not being bagged by hand by RT. When pt is on ventilator, pts oxygen droppes into the 50s-60s%

## 2019-12-27 NOTE — Progress Notes (Signed)
Was able to speak with pt's husband Elianys Conry.  Updated him on his wife's decline and intubation, and that she is critically ill, and prognosis is very guarded.  He requests that we "do everything we can to help her."  All questions answered.    Harlon Ditty, AGACNP-BC [facility] Pager: [phone]

## 2019-12-27 NOTE — Procedures (Signed)
Intubation Procedure Note  GLORIANNA GOTT  [id number]  09/20/1965  Date:12/27/19  Time:5:17 AM   Provider Performing:Lielle Vandervort D [name]    Procedure: Intubation (31500)  Indication(s) Respiratory Failure  Consent Risks of the procedure as well as the alternatives and risks of each were explained to the patient and/or caregiver.  Consent for the procedure was obtained and is signed in the bedside chart   Anesthesia Etomidate, Fentanyl and Rocuronium   Time Out Verified patient identification, verified procedure, site/side was marked, verified correct patient position, special equipment/implants available, medications/allergies/relevant history reviewed, required imaging and test results available.   Sterile Technique Usual hand hygeine, masks, and gloves were used   Procedure Description Patient positioned in bed supine.  Sedation given as noted above.  Patient was intubated with endotracheal tube using Glidescope.  View was Grade 1 full glottis .  Number of attempts was 1.  Colorimetric CO2 detector was consistent with tracheal placement.   Complications/Tolerance None; patient tolerated the procedure well. Chest X-ray is ordered to verify placement.   EBL Minimal   Specimen(s) None  Size 7.5 ETT  Secured at 22 cm at the lip.     Harlon Ditty, AGACNP-BC [place] [facility] Pager: [phone]

## 2019-12-27 NOTE — Progress Notes (Signed)
Pharmacy Antibiotic Note  Carrie Davies is a 54 y.o. female admitted on 12/27/2019 with pneumonia.  Pharmacy has been consulted for Cefepime dosing.  Plan: Cefepime 2 gm IV Q8H ordered to start on 10/25 @ 0600.  Height: 5\' 6"  (167.6 cm) Weight: 123.8 kg (273 lb) IBW/kg (Calculated) : 59.3  Temp (24hrs), Avg:100.7 F (38.2 C), Min:99.7 F (37.6 C), Max:101.6 F (38.7 C)  Recent Labs  Lab 12/06/2019 1329 12/25/2019 1604 12/14/2019 1730  WBC 7.7  --  6.9  CREATININE 0.74  --  0.64  LATICACIDVEN 2.9* 0.9  --     Estimated Creatinine Clearance: 105.5 mL/min (by C-G formula based on SCr of 0.64 mg/dL).    No Known Allergies  Antimicrobials this admission:   >>    >>  Dose adjustments this admission:   Microbiology results:  BCx:  UCx:    Sputum:    MRSA PCR:   Thank you for allowing pharmacy to be a part of this patient's care.  Onelia Cadmus D 12/27/2019 5:55 AM

## 2019-12-28 ENCOUNTER — Inpatient Hospital Stay: Payer: BC Managed Care – PPO

## 2019-12-28 DIAGNOSIS — U071 COVID-19: Secondary | ICD-10-CM | POA: Diagnosis not present

## 2019-12-28 DIAGNOSIS — J9600 Acute respiratory failure, unspecified whether with hypoxia or hypercapnia: Secondary | ICD-10-CM | POA: Diagnosis not present

## 2019-12-28 LAB — GLUCOSE, CAPILLARY
Glucose-Capillary: 100 mg/dL — ABNORMAL HIGH (ref 70–99)
Glucose-Capillary: 104 mg/dL — ABNORMAL HIGH (ref 70–99)
Glucose-Capillary: 132 mg/dL — ABNORMAL HIGH (ref 70–99)
Glucose-Capillary: 141 mg/dL — ABNORMAL HIGH (ref 70–99)
Glucose-Capillary: 149 mg/dL — ABNORMAL HIGH (ref 70–99)
Glucose-Capillary: 175 mg/dL — ABNORMAL HIGH (ref 70–99)

## 2019-12-28 LAB — CBC
HCT: 38.7 % (ref 36.0–46.0)
Hemoglobin: 12.3 g/dL (ref 12.0–15.0)
MCH: 29.6 pg (ref 26.0–34.0)
MCHC: 31.8 g/dL (ref 30.0–36.0)
MCV: 93.3 fL (ref 80.0–100.0)
Platelets: 290 10*3/uL (ref 150–400)
RBC: 4.15 MIL/uL (ref 3.87–5.11)
RDW: 13.8 % (ref 11.5–15.5)
WBC: 13.7 10*3/uL — ABNORMAL HIGH (ref 4.0–10.5)
nRBC: 0 % (ref 0.0–0.2)

## 2019-12-28 LAB — PHOSPHORUS
Phosphorus: 2.8 mg/dL (ref 2.5–4.6)
Phosphorus: 3.9 mg/dL (ref 2.5–4.6)

## 2019-12-28 LAB — BASIC METABOLIC PANEL
Anion gap: 10 (ref 5–15)
BUN: 24 mg/dL — ABNORMAL HIGH (ref 6–20)
CO2: 28 mmol/L (ref 22–32)
Calcium: 8 mg/dL — ABNORMAL LOW (ref 8.9–10.3)
Chloride: 102 mmol/L (ref 98–111)
Creatinine, Ser: 0.72 mg/dL (ref 0.44–1.00)
GFR, Estimated: >60 mL/min (ref >60)
Glucose, Bld: 165 mg/dL — ABNORMAL HIGH (ref 70–99)
Potassium: 3.6 mmol/L (ref 3.5–5.1)
Sodium: 140 mmol/L (ref 135–145)

## 2019-12-28 LAB — FIBRIN DERIVATIVES D-DIMER (ARMC ONLY): Fibrin derivatives D-dimer (ARMC): 924.49 ng/mL (FEU) — ABNORMAL HIGH (ref 0.00–499.00)

## 2019-12-28 LAB — URINE CULTURE: Culture: <10000 — AB

## 2019-12-28 LAB — LEGIONELLA PNEUMOPHILA SEROGP 1 UR AG: L. pneumophila Serogp 1 Ur Ag: NEGATIVE

## 2019-12-28 LAB — TRIGLYCERIDES: Triglycerides: 152 mg/dL — ABNORMAL HIGH (ref <150)

## 2019-12-28 LAB — MAGNESIUM
Magnesium: 2.6 mg/dL — ABNORMAL HIGH (ref 1.7–2.4)
Magnesium: 2.5 mg/dL — ABNORMAL HIGH (ref 1.7–2.4)

## 2019-12-28 LAB — C-REACTIVE PROTEIN: CRP: 9.6 mg/dL — ABNORMAL HIGH (ref <1.0)

## 2019-12-28 LAB — PROCALCITONIN: Procalcitonin: 0.13 ng/mL

## 2019-12-28 LAB — FERRITIN: Ferritin: 698 ng/mL — ABNORMAL HIGH (ref 11–307)

## 2019-12-28 MED ORDER — POTASSIUM CHLORIDE 20 MEQ PO PACK
40.0000 meq | PACK | Freq: Once | ORAL | Status: AC
  Administered 2019-12-28: 40 meq
  Filled 2019-12-28: qty 2

## 2019-12-28 MED ORDER — SODIUM CHLORIDE 0.9 % IV SOLN
2.0000 mg/h | INTRAVENOUS | Status: DC
  Administered 2019-12-28 – 2020-01-04 (×8): 2 mg/h via INTRAVENOUS
  Administered 2020-01-05: 4 mg/h via INTRAVENOUS
  Administered 2020-01-06 – 2020-01-09 (×4): 2 mg/h via INTRAVENOUS
  Filled 2019-12-28 (×13): qty 5

## 2019-12-28 NOTE — Progress Notes (Signed)
Patient re intubated @ 1430 due to large cuff leak.  Patient was re intubated with size 8.0 ETT and the cuff blew and ETT removed and another 8.0 ETT was placed.

## 2019-12-28 NOTE — Progress Notes (Signed)
Assisted tele visit to patient with husband.  Carrie Davies P, RN  

## 2019-12-28 NOTE — Procedures (Signed)
Replaced the ETT for a blown cuff As soon as the new ETT was placed to MV, it was also leaking It was removed for yet another ETT  The patient's mouth was held open and the tube was carefully guided away from any teeth   This tube seems to be holding now. I think the prior 3 tubes raked a sharp tooth perhaps  Her cords were easily visible, but there was some mild resistance below.  CXR confirmed placement  //Carrie Davies

## 2019-12-28 NOTE — Progress Notes (Addendum)
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

## 2019-12-28 NOTE — Progress Notes (Signed)
Follow up - Critical Care Medicine Note  Patient Details:    Carrie Davies is an 54 y.o. female with no aj major chronic medical issues, remote former smoker admitted on 26 December 2019 with acute respiratory failure with hypoxia due to COVID-19 pneumonia.  Admitted to stepdown status initially.  Lines, Airways, Drains: Airway 7.5 mm (Active)  Secured at (cm) 22 cm 12/27/19 2004  Measured From Lips 12/27/19 2004  Secured Location Right 12/27/19 2004  Secured By [employer] 12/27/19 2004  Tube Holder Repositioned Yes 12/27/19 2004  Cuff Pressure (cm H2O) 26 cm H2O 12/27/19 2004  Site Condition Dry 12/27/19 2004     PICC Double Lumen 03/00/92 PICC Right Basilic 40 cm 0 cm (Active)  Exposed Catheter (cm) 0 cm 12/27/19 1442  Site Assessment Clean;Dry;Intact 12/27/19 1442  Lumen #1 Status Flushed;Blood return noted;Saline locked 12/27/19 1442  Lumen #2 Status Flushed;Blood return noted;Saline locked 12/27/19 1442  Dressing Type Transparent;Securing device 12/27/19 1442  Dressing Status Clean;Dry;Intact 12/27/19 1442  Antimicrobial disc in place? Yes 12/27/19 1442  Safety Lock Not Applicable 33/00/76 2263  Dressing Change Due 01/03/20 12/27/19 1442     NG/OG Tube Orogastric 18 Fr. Center mouth Aucultation 60 cm (Active)  Cm Marking at Nare/Corner of Mouth (if applicable) 59 cm 33/54/56 1920  Site Assessment Clean;Dry;Intact 12/27/19 1920  Ongoing Placement Verification No change in cm markings or external length of tube from initial placement;No change in respiratory status;No acute changes, not attributed to clinical condition 12/27/19 1920  Status Infusing tube feed 12/27/19 1920     Urethral Catheter Miquel Dunn RN Temperature probe 14 Fr. (Active)  Indication for Insertion or Continuance of Catheter Therapy based on hourly urine output monitoring and documentation for critical condition (NOT STRICT I&O) 12/27/19 1920  Site Assessment Clean;Intact 12/27/19 1920  Catheter  Maintenance Bag below level of bladder;Catheter secured;Drainage bag/tubing not touching floor;Insertion date on drainage bag;No dependent loops;Bag emptied prior to transport 12/27/19 1920  Collection Container Standard drainage bag 12/27/19 1920  Securement Method Securing device (Describe) 12/27/19 1920  Output (mL) 250 mL 12/27/19 2205    Anti-infectives:  Anti-infectives (From admission, onward)   Start     Dose/Rate Route Frequency Ordered Stop   12/27/19 1000  remdesivir 100 mg in sodium chloride 0.9 % 100 mL IVPB  Status:  Discontinued       "Followed by" Linked Group Details   100 mg 200 mL/hr over 30 Minutes Intravenous Daily 12/15/2019 1422 01/02/2020 1459   12/27/19 0600  ceFEPIme (MAXIPIME) 2 g in sodium chloride 0.9 % 100 mL IVPB  Status:  Discontinued        2 g 200 mL/hr over 30 Minutes Intravenous Every 8 hours 12/27/19 0553 12/27/19 1412   12/04/2019 1530  remdesivir 200 mg in sodium chloride 0.9% 250 mL IVPB  Status:  Discontinued       "Followed by" Linked Group Details   200 mg 580 mL/hr over 30 Minutes Intravenous Once 12/29/2019 1422 12/25/2019 1459     Scheduled Meds: . chlorhexidine gluconate (MEDLINE KIT)  15 mL Mouth Rinse BID  . Chlorhexidine Gluconate Cloth  6 each Topical Q0600  . dexamethasone (DECADRON) injection  10 mg Intravenous Q24H  . docusate  100 mg Per Tube BID  . enoxaparin (LOVENOX) injection  60 mg Subcutaneous Q24H  . feeding supplement (PROSource TF)  90 mL Per Tube QID  . feeding supplement (VITAL AF 1.2 CAL)  1,000 mL Per Tube Q24H  . insulin  aspart  0-20 Units Subcutaneous Q4H  . mouth rinse  15 mL Mouth Rinse 10 times per day  . mupirocin ointment  1 application Nasal BID  . pantoprazole (PROTONIX) IV  40 mg Intravenous Q12H  . polyethylene glycol  17 g Per Tube Daily  . sodium chloride flush  10-40 mL Intracatheter Q12H   Continuous Infusions: . sodium chloride    . HYDROmorphone    . norepinephrine (LEVOPHED) Adult infusion 4 mcg/min  (12/28/19 0600)  . propofol (DIPRIVAN) infusion 50 mcg/kg/min (12/28/19 0903)   PRN Meds:.acetaminophen, docusate, midazolam, midazolam, ondansetron (ZOFRAN) IV, polyethylene glycol, sodium chloride flush, vecuronium    Microbiology: Results for orders placed or performed during the hospital encounter of 12/08/2019  Blood Culture (routine x 2)     Status: None (Preliminary result)   Collection Time: 12/03/2019  1:29 PM   Specimen: BLOOD  Result Value Ref Range Status   Specimen Description BLOOD LEFT ANTECUBITAL  Final   Special Requests   Final    BOTTLES DRAWN AEROBIC AND ANAEROBIC Blood Culture adequate volume   Culture   Final    NO GROWTH 2 DAYS Performed at [facility], [address] [place], [place] [address]    Report Status PENDING  Incomplete  Respiratory Panel by RT PCR (Flu A&B, Covid) - Nasopharyngeal Swab     Status: Abnormal   Collection Time: 12/05/2019  1:30 PM   Specimen: Nasopharyngeal Swab  Result Value Ref Range Status   SARS Coronavirus 2 by RT PCR POSITIVE (A) NEGATIVE Final    Comment: RESULT CALLED TO, READ BACK BY AND VERIFIED WITH: [place] ON 12/11/2019 AT 5537 TIK (NOTE) SARS-CoV-2 target nucleic acids are DETECTED.  SARS-CoV-2 RNA is generally detectable in upper respiratory specimens  during the acute phase of infection. Positive results are indicative of the presence of the identified virus, but do not rule out bacterial infection or co-infection with other pathogens not detected by the test. Clinical correlation with patient history and other diagnostic information is necessary to determine patient infection status. The expected result is Negative.  Fact Sheet for Patients:  PinkCheek.be  Fact Sheet for Healthcare Providers: GravelBags.it  This test is not yet approved or cleared by the [place] FDA and  has been authorized for detection and/or diagnosis of  SARS-CoV-2 by FDA under an Emergency Use Authorization (EUA).  This EUA will remain in effect (meaning this test c an be used) for the duration of  the COVID-19 declaration under Section 564(b)(1) of the Act, 21 U.S.C. section 360bbb-3(b)(1), unless the authorization is terminated or revoked sooner.      Influenza A by PCR NEGATIVE NEGATIVE Final   Influenza B by PCR NEGATIVE NEGATIVE Final    Comment: (NOTE) The Xpert Xpress SARS-CoV-2/FLU/RSV assay is intended as an aid in  the diagnosis of influenza from Nasopharyngeal swab specimens and  should not be used as a sole basis for treatment. Nasal washings and  aspirates are unacceptable for Xpert Xpress SARS-CoV-2/FLU/RSV  testing.  Fact Sheet for Patients: PinkCheek.be  Fact Sheet for Healthcare Providers: GravelBags.it  This test is not yet approved or cleared by the [place] FDA and  has been authorized for detection and/or diagnosis of SARS-CoV-2 by  FDA under an Emergency Use Authorization (EUA). This EUA will remain  in effect (meaning this test can be used) for the duration of the  Covid-19 declaration under Section 564(b)(1) of the Act, 21  U.S.C. section 360bbb-3(b)(1), unless the authorization  is  terminated or revoked. Performed at [facility], [address], [place], [place] [address]   Blood Culture (routine x 2)     Status: None (Preliminary result)   Collection Time: 12/10/2019  1:34 PM   Specimen: BLOOD  Result Value Ref Range Status   Specimen Description BLOOD RIGHT ANTECUBITAL  Final   Special Requests   Final    BOTTLES DRAWN AEROBIC AND ANAEROBIC Blood Culture results may not be optimal due to an excessive volume of blood received in culture bottles   Culture   Final    NO GROWTH 2 DAYS Performed at [facility], [address], [place], [place] [address]    Report Status PENDING  Incomplete    Best  Practice/Protocols:  VTE Prophylaxis: Lovenox (prophylaxtic dose) GI Prophylaxis: Proton Pump Inhibitor Continous Sedation ARDS Hyperglycemia (ICU) VAP protocol   Events: 12/15/2019 admitted as stepdown status with acute respiratory failure due to COVID-19 viral infection/pneumonia on high flow O2 12/27/2019-intubated to progressive respiratory failure  Studies: DG CHEST PORT 1 VIEW  Result Date: 12/28/2019 CLINICAL DATA:  Pneumonia due to COVID-19. EXAM: PORTABLE CHEST 1 VIEW COMPARISON:  Radiograph yesterday. FINDINGS: Low positioning of the endotracheal tube 9 mm from the carina. Recommend retraction of 1-2 cm. Enteric tube in place with tip below the diaphragm in the stomach. Right upper extremity PICC in place, tip at the mid SVC. Lower lung volumes from prior exam. Slight progression in heterogeneous bilateral lung opacities from radiograph yesterday. Upper normal heart size unchanged from prior. No visualized pneumothorax or evidence of pneumomediastinum. No significant pleural effusion. IMPRESSION: 1. Low positioning of the endotracheal tube 9 mm from the carina. Recommend retraction of 1-2 cm. 2. Lower lung volumes with slight progression in COVID pneumonia from radiograph yesterday. 3. Right upper extremity PICC tip in the mid SVC. These results will be called to the ordering clinician or representative by the Radiologist Assistant, and communication documented in the PACS or [employer]. Electronically Signed   By: Keith Rake M.D.   On: 12/28/2019 01:05   DG Chest Port 1 View  Result Date: 12/27/2019 CLINICAL DATA:  COVID positive. EXAM: PORTABLE CHEST 1 VIEW COMPARISON:  12/27/2019, earlier same day FINDINGS: 0712 hours. Endotracheal tube tip is 4.5 cm above the base of the carina. The NG tube passes into the stomach although the distal tip position is not included on the film. Cardiopericardial silhouette is at upper limits of normal for size. Interval improvement in the  bilateral diffuse airspace disease. No appreciable pleural effusion. No pneumothorax. Telemetry leads overlie the chest. IMPRESSION: Interval improvement in the bilateral diffuse airspace disease. Electronically Signed   By: Misty Stanley M.D.   On: 12/27/2019 07:24   DG Chest Portable 1 View  Result Date: 12/27/2019 CLINICAL DATA:  Intubation EXAM: PORTABLE CHEST 1 VIEW COMPARISON:  Yesterday FINDINGS: Endotracheal tube with tip at the clavicular heads. The enteric tube reaches the stomach. Dense and progressive bilateral chest opacification with limited aerated lung. Large appearance of the heart but largely obscured. No visible air leak. IMPRESSION: 1. Unremarkable hardware positioning. 2. Severe generalized opacification chest. Electronically Signed   By: Monte Fantasia M.D.   On: 12/27/2019 05:38   DG Chest Port 1 View  Result Date: 12/11/2019 CLINICAL DATA:  Shortness of breath and sepsis. EXAM: PORTABLE CHEST 1 VIEW COMPARISON:  None. FINDINGS: The cardiomediastinal silhouette is unremarkable. Diffuse bilateral airspace opacities are noted. No pneumothorax or large pleural effusion identified. No acute  bony abnormalities are present. IMPRESSION: Diffuse bilateral airspace opacities - question edema versus infection/pneumonia. Electronically Signed   By: Margarette Canada M.D.   On: 12/15/2019 14:23   [place] EKG SITE RITE  Result Date: 12/27/2019 If Site Rite image not attached, placement could not be confirmed due to current cardiac rhythm.   Chest x-ray performed today after intubation, independently reviewed:    Subjective:    Overnight Issues: Worsening respiratory distress and hypoxemia required intubation and mechanical ventilation.Issues with vent pressuring and cuff leak  Objective:  Vital signs for last 24 hours: Temp:  [97.3 F (36.3 C)-100.6 F (38.1 C)] 97.7 F (36.5 C) (10/26 0800) Pulse Rate:  [60-98] 69 (10/26 0800) Resp:  [13-21] 16 (10/26 0800) BP: (73-115)/(56-77)  114/73 (10/26 0800) SpO2:  [87 %-100 %] 90 % (10/26 0855) FiO2 (%):  [70 %-80 %] 75 % (10/26 0855) Weight:  [119.8 kg] 119.8 kg (10/26 0421)  Hemodynamic parameters for last 24 hours:    Intake/Output from previous day: 10/25 0701 - 10/26 0700 In: 1961.8 [I.V.:1483.1; NG/GT:228.7; IV Piggyback:250] Out: 2215 [Urine:2215]  Intake/Output this shift: Total I/O In: -  Out: 100 [Urine:100]  Vent settings for last 24 hours: Vent Mode: PRVC FiO2 (%):  [70 %-80 %] 75 % Set Rate:  [20 bmp] 20 bmp Vt Set:  [400 mL] 400 mL PEEP:  [12 cmH20] 12 cmH20 Plateau Pressure:  [20 PZW25-85 cmH20] 20 cmH20  Physical Exam:   physical examination is limited due to need for PPE/CAPR GENERAL: Obese woman, intubated, mechanically ventilated  HEAD: Normocephalic, atraumatic.  EYES: Pupils equal, round, reactive to light.  No scleral icterus.  MOUTH: Orotracheally intubated, OG in place. NECK: Supple. No thyromegaly. Trachea midline. No JVD.  No adenopathy. PULMONARY: rhonchi CARDIOVASCULAR: Monitor shows regular rate and rhythm.  ABDOMEN: Obese, soft, nondistended. MUSCULOSKELETAL: No joint deformity, no clubbing, no edema.  NEUROLOGIC: Sedated on vent. SKIN: Intact,warm,dry.  No overt rash noted.  No skin breakdown on sacral area noted.  Assessment/Plan:   Acute respiratory failure with hypoxia due to COVID-19 infection COVID-19 pneumonia Patient has progressed to intubation and mechanical ventilation 6cc/kg targeted volume IV dexamethasone Supportive care Trend D-dimer and procalcitonin PCT  at this time <0.10,  WBC 13.7, but trending upward Continue to monitor in ICU Proning if necessary Continue to trend inflammatory markers Ventilator settings adjusted and discussed with RT  Fentanyl disordered breathing, large truncated volumes at a rate of 4-6/min Vecuronium given with resolution, and delta P of perhaps 15-17, peak down from mid 40s to high 20s Will replace fentanyl with dilaudid  and reassess Cuff seems to be holding 20 pressure  Hypokalemia New to replete 3.6 this morning  Hyperglycemia  Likely due to steroids ICU hyperglycemia protocol  Lactic acidosis Resolved with oxygen supplementation Trend lactic acid    LOS: 2 days   Additional comments: Multidisciplinary rounds were performed with the ICU team.   Patient seen and examined face to face fashion Images of the day and lab values reviewed Critical Care Total Time*: 45 Minutes   12/28/2019  *Care during the described time interval was provided by me and/or other providers on the critical care team.  I have reviewed this patient's available data, including medical history, events of note, physical examination and test results as part of my evaluation.  **This note was dictated using voice recognition software/Dragon.  Despite best efforts to proofread, errors can occur which can change the meaning.  Any change was purely unintentional.

## 2019-12-28 NOTE — Progress Notes (Signed)
Patient has ETT cuff leaking again. O2 sats down to 88%, alarming low minute volume. Call to RT to come assess, and call to [facility] NP to make aware of cuff leak.

## 2019-12-28 NOTE — Progress Notes (Signed)
Attempted video chat with husband

## 2019-12-29 DIAGNOSIS — U071 COVID-19: Secondary | ICD-10-CM | POA: Diagnosis not present

## 2019-12-29 DIAGNOSIS — J9600 Acute respiratory failure, unspecified whether with hypoxia or hypercapnia: Secondary | ICD-10-CM | POA: Diagnosis not present

## 2019-12-29 LAB — FERRITIN: Ferritin: 777 ng/mL — ABNORMAL HIGH (ref 11–307)

## 2019-12-29 LAB — CBC
HCT: 38.7 % (ref 36.0–46.0)
Hemoglobin: 12 g/dL (ref 12.0–15.0)
MCH: 29.7 pg (ref 26.0–34.0)
MCHC: 31 g/dL (ref 30.0–36.0)
MCV: 95.8 fL (ref 80.0–100.0)
Platelets: 268 10*3/uL (ref 150–400)
RBC: 4.04 MIL/uL (ref 3.87–5.11)
RDW: 13.6 % (ref 11.5–15.5)
WBC: 13.1 10*3/uL — ABNORMAL HIGH (ref 4.0–10.5)
nRBC: 0 % (ref 0.0–0.2)

## 2019-12-29 LAB — BASIC METABOLIC PANEL
Anion gap: 8 (ref 5–15)
BUN: 31 mg/dL — ABNORMAL HIGH (ref 6–20)
CO2: 32 mmol/L (ref 22–32)
Calcium: 8.6 mg/dL — ABNORMAL LOW (ref 8.9–10.3)
Chloride: 104 mmol/L (ref 98–111)
Creatinine, Ser: 0.6 mg/dL (ref 0.44–1.00)
GFR, Estimated: >60 mL/min (ref >60)
Glucose, Bld: 149 mg/dL — ABNORMAL HIGH (ref 70–99)
Potassium: 4.4 mmol/L (ref 3.5–5.1)
Sodium: 144 mmol/L (ref 135–145)

## 2019-12-29 LAB — TRIGLYCERIDES: Triglycerides: 160 mg/dL — ABNORMAL HIGH (ref <150)

## 2019-12-29 LAB — FIBRIN DERIVATIVES D-DIMER (ARMC ONLY): Fibrin derivatives D-dimer (ARMC): 1404.18 ng/mL (FEU) — ABNORMAL HIGH (ref 0.00–499.00)

## 2019-12-29 LAB — GLUCOSE, CAPILLARY
Glucose-Capillary: 141 mg/dL — ABNORMAL HIGH (ref 70–99)
Glucose-Capillary: 152 mg/dL — ABNORMAL HIGH (ref 70–99)
Glucose-Capillary: 128 mg/dL — ABNORMAL HIGH (ref 70–99)
Glucose-Capillary: 125 mg/dL — ABNORMAL HIGH (ref 70–99)
Glucose-Capillary: 124 mg/dL — ABNORMAL HIGH (ref 70–99)
Glucose-Capillary: 161 mg/dL — ABNORMAL HIGH (ref 70–99)
Glucose-Capillary: 153 mg/dL — ABNORMAL HIGH (ref 70–99)

## 2019-12-29 LAB — PHOSPHORUS
Phosphorus: 3.3 mg/dL (ref 2.5–4.6)
Phosphorus: 2.7 mg/dL (ref 2.5–4.6)

## 2019-12-29 LAB — MAGNESIUM
Magnesium: 2.8 mg/dL — ABNORMAL HIGH (ref 1.7–2.4)
Magnesium: 2.7 mg/dL — ABNORMAL HIGH (ref 1.7–2.4)

## 2019-12-29 LAB — C-REACTIVE PROTEIN: CRP: 15.5 mg/dL — ABNORMAL HIGH (ref <1.0)

## 2019-12-29 NOTE — Progress Notes (Signed)
Follow up - Critical Care Medicine Note  Patient Details:    Carrie Davies is an 54 y.o. female with no aj major chronic medical issues, remote former smoker admitted on 26 December 2019 with acute respiratory failure with hypoxia due to COVID-19 pneumonia.  Admitted to stepdown status initially.  Lines, Airways, Drains: Airway 7.5 mm (Active)  Secured at (cm) 22 cm 12/27/19 2004  Measured From Lips 12/27/19 2004  Secured Location Right 12/27/19 2004  Secured By [employer] 12/27/19 2004  Tube Holder Repositioned Yes 12/27/19 2004  Cuff Pressure (cm H2O) 26 cm H2O 12/27/19 2004  Site Condition Dry 12/27/19 2004     PICC Double Lumen 92/42/68 PICC Right Basilic 40 cm 0 cm (Active)  Exposed Catheter (cm) 0 cm 12/27/19 1442  Site Assessment Clean;Dry;Intact 12/27/19 1442  Lumen #1 Status Flushed;Blood return noted;Saline locked 12/27/19 1442  Lumen #2 Status Flushed;Blood return noted;Saline locked 12/27/19 1442  Dressing Type Transparent;Securing device 12/27/19 1442  Dressing Status Clean;Dry;Intact 12/27/19 1442  Antimicrobial disc in place? Yes 12/27/19 1442  Safety Lock Not Applicable 34/19/62 2297  Dressing Change Due 01/03/20 12/27/19 1442     NG/OG Tube Orogastric 18 Fr. Center mouth Aucultation 60 cm (Active)  Cm Marking at Nare/Corner of Mouth (if applicable) 59 cm 98/92/11 1920  Site Assessment Clean;Dry;Intact 12/27/19 1920  Ongoing Placement Verification No change in cm markings or external length of tube from initial placement;No change in respiratory status;No acute changes, not attributed to clinical condition 12/27/19 1920  Status Infusing tube feed 12/27/19 1920     Urethral Catheter Miquel Dunn RN Temperature probe 14 Fr. (Active)  Indication for Insertion or Continuance of Catheter Therapy based on hourly urine output monitoring and documentation for critical condition (NOT STRICT I&O) 12/27/19 1920  Site Assessment Clean;Intact 12/27/19 1920  Catheter  Maintenance Bag below level of bladder;Catheter secured;Drainage bag/tubing not touching floor;Insertion date on drainage bag;No dependent loops;Bag emptied prior to transport 12/27/19 1920  Collection Container Standard drainage bag 12/27/19 1920  Securement Method Securing device (Describe) 12/27/19 1920  Output (mL) 250 mL 12/27/19 2205    Anti-infectives:  Anti-infectives (From admission, onward)   Start     Dose/Rate Route Frequency Ordered Stop   12/27/19 1000  remdesivir 100 mg in sodium chloride 0.9 % 100 mL IVPB  Status:  Discontinued       "Followed by" Linked Group Details   100 mg 200 mL/hr over 30 Minutes Intravenous Daily 12/21/2019 1422 12/23/2019 1459   12/27/19 0600  ceFEPIme (MAXIPIME) 2 g in sodium chloride 0.9 % 100 mL IVPB  Status:  Discontinued        2 g 200 mL/hr over 30 Minutes Intravenous Every 8 hours 12/27/19 0553 12/27/19 1412   12/08/2019 1530  remdesivir 200 mg in sodium chloride 0.9% 250 mL IVPB  Status:  Discontinued       "Followed by" Linked Group Details   200 mg 580 mL/hr over 30 Minutes Intravenous Once 12/24/2019 1422 12/25/2019 1459     Scheduled Meds:  chlorhexidine gluconate (MEDLINE KIT)  15 mL Mouth Rinse BID   Chlorhexidine Gluconate Cloth  6 each Topical Q0600   dexamethasone (DECADRON) injection  10 mg Intravenous Q24H   enoxaparin (LOVENOX) injection  60 mg Subcutaneous Q24H   feeding supplement (PROSource TF)  90 mL Per Tube QID   feeding supplement (VITAL AF 1.2 CAL)  1,000 mL Per Tube Q24H   insulin aspart  0-20 Units Subcutaneous Q4H   mouth  rinse  15 mL Mouth Rinse 10 times per day   mupirocin ointment  1 application Nasal BID   pantoprazole (PROTONIX) IV  40 mg Intravenous Q12H   sodium chloride flush  10-40 mL Intracatheter Q12H   Continuous Infusions:  sodium chloride 250 mL (12/29/19 1616)   HYDROmorphone 2 mg/hr (12/29/19 0530)   norepinephrine (LEVOPHED) Adult infusion 2 mcg/min (12/29/19 0500)   propofol  (DIPRIVAN) infusion 30 mcg/kg/min (12/29/19 1446)   PRN Meds:.acetaminophen, docusate, midazolam, midazolam, ondansetron (ZOFRAN) IV, polyethylene glycol, sodium chloride flush, vecuronium    Microbiology: Results for orders placed or performed during the hospital encounter of 12/24/2019  Blood Culture (routine x 2)     Status: None (Preliminary result)   Collection Time: 12/30/2019  1:29 PM   Specimen: BLOOD  Result Value Ref Range Status   Specimen Description BLOOD LEFT ANTECUBITAL  Final   Special Requests   Final    BOTTLES DRAWN AEROBIC AND ANAEROBIC Blood Culture adequate volume   Culture   Final    NO GROWTH 3 DAYS Performed at [facility], [address], [place], [place] [address]    Report Status PENDING  Incomplete  Respiratory Panel by RT PCR (Flu A&B, Covid) - Nasopharyngeal Swab     Status: Abnormal   Collection Time: 12/20/2019  1:30 PM   Specimen: Nasopharyngeal Swab  Result Value Ref Range Status   SARS Coronavirus 2 by RT PCR POSITIVE (A) NEGATIVE Final    Comment: RESULT CALLED TO, READ BACK BY AND VERIFIED WITH: KATE BUMGARNER ON 12/11/2019 AT 7253 TIK (NOTE) SARS-CoV-2 target nucleic acids are DETECTED.  SARS-CoV-2 RNA is generally detectable in upper respiratory specimens  during the acute phase of infection. Positive results are indicative of the presence of the identified virus, but do not rule out bacterial infection or co-infection with other pathogens not detected by the test. Clinical correlation with patient history and other diagnostic information is necessary to determine patient infection status. The expected result is Negative.  Fact Sheet for Patients:  PinkCheek.be  Fact Sheet for Healthcare Providers: GravelBags.it  This test is not yet approved or cleared by the [place] FDA and  has been authorized for detection and/or diagnosis of SARS-CoV-2 by FDA under an  Emergency Use Authorization (EUA).  This EUA will remain in effect (meaning this test c an be used) for the duration of  the COVID-19 declaration under Section 564(b)(1) of the Act, 21 U.S.C. section 360bbb-3(b)(1), unless the authorization is terminated or revoked sooner.      Influenza A by PCR NEGATIVE NEGATIVE Final   Influenza B by PCR NEGATIVE NEGATIVE Final    Comment: (NOTE) The Xpert Xpress SARS-CoV-2/FLU/RSV assay is intended as an aid in  the diagnosis of influenza from Nasopharyngeal swab specimens and  should not be used as a sole basis for treatment. Nasal washings and  aspirates are unacceptable for Xpert Xpress SARS-CoV-2/FLU/RSV  testing.  Fact Sheet for Patients: PinkCheek.be  Fact Sheet for Healthcare Providers: GravelBags.it  This test is not yet approved or cleared by the [place] FDA and  has been authorized for detection and/or diagnosis of SARS-CoV-2 by  FDA under an Emergency Use Authorization (EUA). This EUA will remain  in effect (meaning this test can be used) for the duration of the  Covid-19 declaration under Section 564(b)(1) of the Act, 21  U.S.C. section 360bbb-3(b)(1), unless the authorization is  terminated or revoked. Performed at [facility], [address] [place],  [place] [address]   Blood Culture (routine x 2)     Status: None (Preliminary result)   Collection Time: 12/27/2019  1:34 PM   Specimen: BLOOD  Result Value Ref Range Status   Specimen Description BLOOD RIGHT ANTECUBITAL  Final   Special Requests   Final    BOTTLES DRAWN AEROBIC AND ANAEROBIC Blood Culture results may not be optimal due to an excessive volume of blood received in culture bottles   Culture   Final    NO GROWTH 3 DAYS Performed at [facility], [address] [place], [place] [address]    Report Status PENDING  Incomplete  Urine culture     Status: Abnormal   Collection Time:  12/27/19  5:37 AM   Specimen: In/Out Cath Urine  Result Value Ref Range Status   Specimen Description   Final    IN/OUT CATH URINE Performed at [facility], [address], [place], [place] [address]    Special Requests   Final    NONE Performed at [facility], [address] [place], [place] [address]    Culture (A)  Final    <10,000 COLONIES/mL INSIGNIFICANT GROWTH Performed at [facility], [address] [place], [place] [address]    Report Status 12/28/2019 FINAL  Final    Best Practice/Protocols:  VTE Prophylaxis: Lovenox (prophylaxtic dose) GI Prophylaxis: Proton Pump Inhibitor Continous Sedation ARDS Hyperglycemia (ICU) VAP protocol   Events: 12/23/2019 admitted as stepdown status with acute respiratory failure due to COVID-19 viral infection/pneumonia on high flow O2 12/27/2019-intubated to progressive respiratory failure  Studies: DG Chest Port 1 View  Result Date: 12/28/2019 CLINICAL DATA:  Endotracheal tube present.  COVID positive. EXAM: PORTABLE CHEST 1 VIEW COMPARISON:  Same day chest radiograph FINDINGS: Interval retraction of the endotracheal tube with the tip now projecting approximately 2.6 cm above the carina. Additional support devices are in similar position. Slightly improved aeration lungs with otherwise similar extensive bilateral airspace opacities. No visible pleural effusions or pneumothorax. Similar cardiomediastinal silhouette. IMPRESSION: 1. Interval retraction of the endotracheal tube with the tip now projecting approximately 2.6 cm above the carina. 2. Slightly improved aeration of the lungs with otherwise similar extensive bilateral airspace opacities, compatible with multifocal pneumonia and reported COVID diagnosis. Electronically Signed   By: Margaretha Sheffield MD   On: 12/28/2019 15:31   DG CHEST PORT 1 VIEW  Result Date: 12/28/2019 CLINICAL DATA:  Pneumonia due to COVID-19. EXAM: PORTABLE CHEST 1 VIEW  COMPARISON:  Radiograph yesterday. FINDINGS: Low positioning of the endotracheal tube 9 mm from the carina. Recommend retraction of 1-2 cm. Enteric tube in place with tip below the diaphragm in the stomach. Right upper extremity PICC in place, tip at the mid SVC. Lower lung volumes from prior exam. Slight progression in heterogeneous bilateral lung opacities from radiograph yesterday. Upper normal heart size unchanged from prior. No visualized pneumothorax or evidence of pneumomediastinum. No significant pleural effusion. IMPRESSION: 1. Low positioning of the endotracheal tube 9 mm from the carina. Recommend retraction of 1-2 cm. 2. Lower lung volumes with slight progression in COVID pneumonia from radiograph yesterday. 3. Right upper extremity PICC tip in the mid SVC. These results will be called to the ordering clinician or representative by the Radiologist Assistant, and communication documented in the PACS or [employer]. Electronically Signed   By: Keith Rake M.D.   On: 12/28/2019 01:05   DG Chest Port 1 View  Result Date: 12/27/2019 CLINICAL DATA:  COVID positive. EXAM: PORTABLE CHEST 1 VIEW COMPARISON:  12/27/2019, earlier same day FINDINGS: 0712 hours. Endotracheal tube tip is 4.5 cm above the base of the carina. The NG tube passes into the stomach although the distal tip position is not included on the film. Cardiopericardial silhouette is at upper limits of normal for size. Interval improvement in the bilateral diffuse airspace disease. No appreciable pleural effusion. No pneumothorax. Telemetry leads overlie the chest. IMPRESSION: Interval improvement in the bilateral diffuse airspace disease. Electronically Signed   By: Misty Stanley M.D.   On: 12/27/2019 07:24   DG Chest Portable 1 View  Result Date: 12/27/2019 CLINICAL DATA:  Intubation EXAM: PORTABLE CHEST 1 VIEW COMPARISON:  Yesterday FINDINGS: Endotracheal tube with tip at the clavicular heads. The enteric tube reaches the  stomach. Dense and progressive bilateral chest opacification with limited aerated lung. Large appearance of the heart but largely obscured. No visible air leak. IMPRESSION: 1. Unremarkable hardware positioning. 2. Severe generalized opacification chest. Electronically Signed   By: Monte Fantasia M.D.   On: 12/27/2019 05:38   DG Chest Port 1 View  Result Date: 12/16/2019 CLINICAL DATA:  Shortness of breath and sepsis. EXAM: PORTABLE CHEST 1 VIEW COMPARISON:  None. FINDINGS: The cardiomediastinal silhouette is unremarkable. Diffuse bilateral airspace opacities are noted. No pneumothorax or large pleural effusion identified. No acute bony abnormalities are present. IMPRESSION: Diffuse bilateral airspace opacities - question edema versus infection/pneumonia. Electronically Signed   By: Margarette Canada M.D.   On: 12/08/2019 14:23   [place] EKG SITE RITE  Result Date: 12/27/2019 If Site Rite image not attached, placement could not be confirmed due to current cardiac rhythm.   Chest x-ray performed today after intubation, independently reviewed:    Subjective:    Overnight Issues: Worsening respiratory distress and hypoxemia required intubation and mechanical ventilation.Issues with vent pressuring and cuff leak  Objective:  Vital signs for last 24 hours: Temp:  [97.5 F (36.4 C)-99.5 F (37.5 C)] 98.6 F (37 C) (10/27 1600) Pulse Rate:  [70-96] 80 (10/27 1600) Resp:  [12-24] 24 (10/27 1600) BP: (88-107)/(56-83) 103/62 (10/27 1600) SpO2:  [96 %-100 %] 100 % (10/27 1600) FiO2 (%):  [65 %] 65 % (10/27 1320) Weight:  [123.6 kg] 123.6 kg (10/27 0500)  Hemodynamic parameters for last 24 hours:    Intake/Output from previous day: 10/26 0701 - 10/27 0700 In: 1233.4 [I.V.:1233.4] Out: 1650 [Urine:1650]  Intake/Output this shift: Total I/O In: -  Out: 425 [Urine:425]  Vent settings for last 24 hours: Vent Mode: PRVC FiO2 (%):  [65 %] 65 % Set Rate:  [24 bmp] 24 bmp Vt Set:  [400 mL] 400  mL PEEP:  [12 cmH20-13 cmH20] 12 cmH20  Physical Exam:   physical examination is limited due to need for PPE/CAPR GENERAL: Obese woman, intubated, mechanically ventilated  HEAD: Normocephalic, atraumatic.  EYES: Pupils equal, round, reactive to light.  No scleral icterus.  MOUTH: Orotracheally intubated, OG in place. NECK: Supple. No thyromegaly. Trachea midline. No JVD.  No adenopathy. PULMONARY: rhonchi CARDIOVASCULAR: Monitor shows regular rate and rhythm.  ABDOMEN: Obese, soft, nondistended. MUSCULOSKELETAL: No joint deformity, no clubbing, no edema.  NEUROLOGIC: Sedated on vent. SKIN: Intact,warm,dry.  No overt rash noted.  No skin breakdown on sacral area noted.  Assessment/Plan:   Acute respiratory failure with hypoxia due to COVID-19 infection COVID-19 pneumonia 10/26 Patient has progressed to intubation and mechanical ventilation 6cc/kg targeted volume IV dexamethasone Supportive care Trend D-dimer and procalcitonin PCT  at this time <  0.10,  WBC 13.7, but trending upward Continue to monitor in ICU Proning if necessary Continue to trend inflammatory markers Ventilator settings adjusted and discussed with RT  Fentanyl disordered breathing, large truncated volumes at a rate of 4-6/min Vecuronium given with resolution, and delta P of perhaps 15-17, peak down from mid 40s to high 20s Will replace fentanyl with dilaudid and reassess Cuff seems to be holding 20 pressure  10/27 Continue to monitor cuff for leak WBC 13.1 Prone as indicated Dilaudid over fentanyl for as above   Hypokalemia New to replete 4.4 this morning  Hyperglycemia  Likely due to steroids ICU hyperglycemia protocol  Lactic acidosis Resolved with oxygen supplementation Trend lactic acid    LOS: 3 days   Additional comments: Multidisciplinary rounds were performed with the ICU team.   Patient seen and examined face to face fashion Images of the day and lab values reviewed Critical  Care Total Time: 45 Minutes   12/29/2019  *Care during the described time interval was provided by me and/or other providers on the critical care team.  I have reviewed this patient's available data, including medical history, events of note, physical examination and test results as part of my evaluation.  **This note was dictated using voice recognition software/Dragon.  Despite best efforts to proofread, errors can occur which can change the meaning.  Any change was purely unintentional.

## 2019-12-29 NOTE — Progress Notes (Signed)
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

## 2019-12-30 DIAGNOSIS — J9600 Acute respiratory failure, unspecified whether with hypoxia or hypercapnia: Secondary | ICD-10-CM | POA: Diagnosis not present

## 2019-12-30 DIAGNOSIS — U071 COVID-19: Secondary | ICD-10-CM | POA: Diagnosis not present

## 2019-12-30 LAB — CBC
HCT: 33.8 % — ABNORMAL LOW (ref 36.0–46.0)
Hemoglobin: 10.7 g/dL — ABNORMAL LOW (ref 12.0–15.0)
MCH: 30.2 pg (ref 26.0–34.0)
MCHC: 31.7 g/dL (ref 30.0–36.0)
MCV: 95.5 fL (ref 80.0–100.0)
Platelets: 279 10*3/uL (ref 150–400)
RBC: 3.54 MIL/uL — ABNORMAL LOW (ref 3.87–5.11)
RDW: 13.1 % (ref 11.5–15.5)
WBC: 10.4 10*3/uL (ref 4.0–10.5)
nRBC: 0 % (ref 0.0–0.2)

## 2019-12-30 LAB — GLUCOSE, CAPILLARY
Glucose-Capillary: 120 mg/dL — ABNORMAL HIGH (ref 70–99)
Glucose-Capillary: 125 mg/dL — ABNORMAL HIGH (ref 70–99)
Glucose-Capillary: 130 mg/dL — ABNORMAL HIGH (ref 70–99)
Glucose-Capillary: 135 mg/dL — ABNORMAL HIGH (ref 70–99)
Glucose-Capillary: 147 mg/dL — ABNORMAL HIGH (ref 70–99)
Glucose-Capillary: 154 mg/dL — ABNORMAL HIGH (ref 70–99)

## 2019-12-30 LAB — BASIC METABOLIC PANEL
Anion gap: 9 (ref 5–15)
BUN: 38 mg/dL — ABNORMAL HIGH (ref 6–20)
CO2: 34 mmol/L — ABNORMAL HIGH (ref 22–32)
Calcium: 8.7 mg/dL — ABNORMAL LOW (ref 8.9–10.3)
Chloride: 105 mmol/L (ref 98–111)
Creatinine, Ser: 0.6 mg/dL (ref 0.44–1.00)
GFR, Estimated: >60 mL/min (ref >60)
Glucose, Bld: 133 mg/dL — ABNORMAL HIGH (ref 70–99)
Potassium: 4.4 mmol/L (ref 3.5–5.1)
Sodium: 148 mmol/L — ABNORMAL HIGH (ref 135–145)

## 2019-12-30 LAB — TRIGLYCERIDES: Triglycerides: 143 mg/dL (ref <150)

## 2019-12-30 MED ORDER — FREE WATER
100.0000 mL | Status: DC
  Administered 2019-12-30 – 2020-01-01 (×12): 100 mL

## 2019-12-30 MED ORDER — FUROSEMIDE 10 MG/ML IJ SOLN
20.0000 mg | Freq: Once | INTRAMUSCULAR | Status: AC
  Administered 2019-12-30: 20 mg via INTRAVENOUS
  Filled 2019-12-30: qty 2

## 2019-12-30 NOTE — Progress Notes (Signed)
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

## 2019-12-30 NOTE — Progress Notes (Signed)
Follow up - Critical Care Medicine Note  Patient Details:    Carrie Davies is an 54 y.o. female with no aj major chronic medical issues, remote former smoker admitted on 26 December 2019 with acute respiratory failure with hypoxia due to COVID-19 pneumonia.  Admitted to stepdown status initially.  Lines, Airways, Drains: Airway 7.5 mm (Active)  Secured at (cm) 22 cm 12/27/19 2004  Measured From Lips 12/27/19 2004  Secured Location Right 12/27/19 2004  Secured By [employer] 12/27/19 2004  Tube Holder Repositioned Yes 12/27/19 2004  Cuff Pressure (cm H2O) 26 cm H2O 12/27/19 2004  Site Condition Dry 12/27/19 2004     PICC Double Lumen 92/33/00 PICC Right Basilic 40 cm 0 cm (Active)  Exposed Catheter (cm) 0 cm 12/27/19 1442  Site Assessment Clean;Dry;Intact 12/27/19 1442  Lumen #1 Status Flushed;Blood return noted;Saline locked 12/27/19 1442  Lumen #2 Status Flushed;Blood return noted;Saline locked 12/27/19 1442  Dressing Type Transparent;Securing device 12/27/19 1442  Dressing Status Clean;Dry;Intact 12/27/19 1442  Antimicrobial disc in place? Yes 12/27/19 1442  Safety Lock Not Applicable 76/22/63 3354  Dressing Change Due 01/03/20 12/27/19 1442     NG/OG Tube Orogastric 18 Fr. Center mouth Aucultation 60 cm (Active)  Cm Marking at Nare/Corner of Mouth (if applicable) 59 cm 56/25/63 1920  Site Assessment Clean;Dry;Intact 12/27/19 1920  Ongoing Placement Verification No change in cm markings or external length of tube from initial placement;No change in respiratory status;No acute changes, not attributed to clinical condition 12/27/19 1920  Status Infusing tube feed 12/27/19 1920     Urethral Catheter Miquel Dunn RN Temperature probe 14 Fr. (Active)  Indication for Insertion or Continuance of Catheter Therapy based on hourly urine output monitoring and documentation for critical condition (NOT STRICT I&O) 12/27/19 1920  Site Assessment Clean;Intact 12/27/19 1920  Catheter  Maintenance Bag below level of bladder;Catheter secured;Drainage bag/tubing not touching floor;Insertion date on drainage bag;No dependent loops;Bag emptied prior to transport 12/27/19 1920  Collection Container Standard drainage bag 12/27/19 1920  Securement Method Securing device (Describe) 12/27/19 1920  Output (mL) 250 mL 12/27/19 2205    Anti-infectives:  Anti-infectives (From admission, onward)   Start     Dose/Rate Route Frequency Ordered Stop   12/27/19 1000  remdesivir 100 mg in sodium chloride 0.9 % 100 mL IVPB  Status:  Discontinued       "Followed by" Linked Group Details   100 mg 200 mL/hr over 30 Minutes Intravenous Daily 12/29/2019 1422 12/18/2019 1459   12/27/19 0600  ceFEPIme (MAXIPIME) 2 g in sodium chloride 0.9 % 100 mL IVPB  Status:  Discontinued        2 g 200 mL/hr over 30 Minutes Intravenous Every 8 hours 12/27/19 0553 12/27/19 1412   12/25/2019 1530  remdesivir 200 mg in sodium chloride 0.9% 250 mL IVPB  Status:  Discontinued       "Followed by" Linked Group Details   200 mg 580 mL/hr over 30 Minutes Intravenous Once 12/06/2019 1422 12/11/2019 1459     Scheduled Meds: . chlorhexidine gluconate (MEDLINE KIT)  15 mL Mouth Rinse BID  . Chlorhexidine Gluconate Cloth  6 each Topical Q0600  . dexamethasone (DECADRON) injection  10 mg Intravenous Q24H  . enoxaparin (LOVENOX) injection  60 mg Subcutaneous Q24H  . feeding supplement (PROSource TF)  90 mL Per Tube QID  . feeding supplement (VITAL AF 1.2 CAL)  1,000 mL Per Tube Q24H  . free water  100 mL Per Tube Q4H  .  insulin aspart  0-20 Units Subcutaneous Q4H  . mouth rinse  15 mL Mouth Rinse 10 times per day  . mupirocin ointment  1 application Nasal BID  . pantoprazole (PROTONIX) IV  40 mg Intravenous Q12H  . sodium chloride flush  10-40 mL Intracatheter Q12H   Continuous Infusions: . sodium chloride 250 mL (12/30/19 1414)  . HYDROmorphone 2 mg/hr (12/30/19 1100)  . propofol (DIPRIVAN) infusion 40 mcg/kg/min (12/30/19  1658)   PRN Meds:.acetaminophen, docusate, midazolam, midazolam, ondansetron (ZOFRAN) IV, polyethylene glycol, sodium chloride flush, vecuronium    Microbiology: Results for orders placed or performed during the hospital encounter of 12/18/2019  Blood Culture (routine x 2)     Status: None (Preliminary result)   Collection Time: 12/25/2019  1:29 PM   Specimen: BLOOD  Result Value Ref Range Status   Specimen Description BLOOD LEFT ANTECUBITAL  Final   Special Requests   Final    BOTTLES DRAWN AEROBIC AND ANAEROBIC Blood Culture adequate volume   Culture   Final    NO GROWTH 4 DAYS Performed at [facility], [address] [place], [place] [address]    Report Status PENDING  Incomplete  Respiratory Panel by RT PCR (Flu A&B, Covid) - Nasopharyngeal Swab     Status: Abnormal   Collection Time: 12/25/2019  1:30 PM   Specimen: Nasopharyngeal Swab  Result Value Ref Range Status   SARS Coronavirus 2 by RT PCR POSITIVE (A) NEGATIVE Final    Comment: RESULT CALLED TO, READ BACK BY AND VERIFIED WITH: Tennant ON 12/08/2019 AT 2025 TIK (NOTE) SARS-CoV-2 target nucleic acids are DETECTED.  SARS-CoV-2 RNA is generally detectable in upper respiratory specimens  during the acute phase of infection. Positive results are indicative of the presence of the identified virus, but do not rule out bacterial infection or co-infection with other pathogens not detected by the test. Clinical correlation with patient history and other diagnostic information is necessary to determine patient infection status. The expected result is Negative.  Fact Sheet for Patients:  PinkCheek.be  Fact Sheet for Healthcare Providers: GravelBags.it  This test is not yet approved or cleared by the [place] FDA and  has been authorized for detection and/or diagnosis of SARS-CoV-2 by FDA under an Emergency Use Authorization (EUA).  This EUA  will remain in effect (meaning this test c an be used) for the duration of  the COVID-19 declaration under Section 564(b)(1) of the Act, 21 U.S.C. section 360bbb-3(b)(1), unless the authorization is terminated or revoked sooner.      Influenza A by PCR NEGATIVE NEGATIVE Final   Influenza B by PCR NEGATIVE NEGATIVE Final    Comment: (NOTE) The Xpert Xpress SARS-CoV-2/FLU/RSV assay is intended as an aid in  the diagnosis of influenza from Nasopharyngeal swab specimens and  should not be used as a sole basis for treatment. Nasal washings and  aspirates are unacceptable for Xpert Xpress SARS-CoV-2/FLU/RSV  testing.  Fact Sheet for Patients: PinkCheek.be  Fact Sheet for Healthcare Providers: GravelBags.it  This test is not yet approved or cleared by the [place] FDA and  has been authorized for detection and/or diagnosis of SARS-CoV-2 by  FDA under an Emergency Use Authorization (EUA). This EUA will remain  in effect (meaning this test can be used) for the duration of the  Covid-19 declaration under Section 564(b)(1) of the Act, 21  U.S.C. section 360bbb-3(b)(1), unless the authorization is  terminated or revoked. Performed at [facility], [address] [place],  [place] [address]   Blood Culture (routine x 2)     Status: None (Preliminary result)   Collection Time: 12/10/2019  1:34 PM   Specimen: BLOOD  Result Value Ref Range Status   Specimen Description BLOOD RIGHT ANTECUBITAL  Final   Special Requests   Final    BOTTLES DRAWN AEROBIC AND ANAEROBIC Blood Culture results may not be optimal due to an excessive volume of blood received in culture bottles   Culture   Final    NO GROWTH 4 DAYS Performed at [facility], [address] [place], [place] [address]    Report Status PENDING  Incomplete  Urine culture     Status: Abnormal   Collection Time: 12/27/19  5:37 AM   Specimen: In/Out Cath  Urine  Result Value Ref Range Status   Specimen Description   Final    IN/OUT CATH URINE Performed at [facility], [address] [place], [place] [address]    Special Requests   Final    NONE Performed at [facility], [address] [place], [place] [address]    Culture (A)  Final    <10,000 COLONIES/mL INSIGNIFICANT GROWTH Performed at [facility], [place] [address] [place], [place] [address]    Report Status 12/28/2019 FINAL  Final    Best Practice/Protocols:  VTE Prophylaxis: Lovenox (prophylaxtic dose) GI Prophylaxis: Proton Pump Inhibitor Continous Sedation ARDS Hyperglycemia (ICU) VAP protocol   Events: 12/11/2019 admitted as stepdown status with acute respiratory failure due to COVID-19 viral infection/pneumonia on high flow O2 12/27/2019-intubated to progressive respiratory failure  Studies: DG Chest Port 1 View  Result Date: 12/28/2019 CLINICAL DATA:  Endotracheal tube present.  COVID positive. EXAM: PORTABLE CHEST 1 VIEW COMPARISON:  Same day chest radiograph FINDINGS: Interval retraction of the endotracheal tube with the tip now projecting approximately 2.6 cm above the carina. Additional support devices are in similar position. Slightly improved aeration lungs with otherwise similar extensive bilateral airspace opacities. No visible pleural effusions or pneumothorax. Similar cardiomediastinal silhouette. IMPRESSION: 1. Interval retraction of the endotracheal tube with the tip now projecting approximately 2.6 cm above the carina. 2. Slightly improved aeration of the lungs with otherwise similar extensive bilateral airspace opacities, compatible with multifocal pneumonia and reported COVID diagnosis. Electronically Signed   By: Margaretha Sheffield MD   On: 12/28/2019 15:31   DG CHEST PORT 1 VIEW  Result Date: 12/28/2019 CLINICAL DATA:  Pneumonia due to COVID-19. EXAM: PORTABLE CHEST 1 VIEW COMPARISON:  Radiograph yesterday. FINDINGS: Low  positioning of the endotracheal tube 9 mm from the carina. Recommend retraction of 1-2 cm. Enteric tube in place with tip below the diaphragm in the stomach. Right upper extremity PICC in place, tip at the mid SVC. Lower lung volumes from prior exam. Slight progression in heterogeneous bilateral lung opacities from radiograph yesterday. Upper normal heart size unchanged from prior. No visualized pneumothorax or evidence of pneumomediastinum. No significant pleural effusion. IMPRESSION: 1. Low positioning of the endotracheal tube 9 mm from the carina. Recommend retraction of 1-2 cm. 2. Lower lung volumes with slight progression in COVID pneumonia from radiograph yesterday. 3. Right upper extremity PICC tip in the mid SVC. These results will be called to the ordering clinician or representative by the Radiologist Assistant, and communication documented in the PACS or [employer]. Electronically Signed   By: Keith Rake M.D.   On: 12/28/2019 01:05   DG Chest Port 1 View  Result Date: 12/27/2019 CLINICAL DATA:  COVID positive. EXAM: PORTABLE CHEST 1 VIEW COMPARISON:  12/27/2019, earlier same day FINDINGS: 0712 hours. Endotracheal tube tip is 4.5 cm above the base of the carina. The NG tube passes into the stomach although the distal tip position is not included on the film. Cardiopericardial silhouette is at upper limits of normal for size. Interval improvement in the bilateral diffuse airspace disease. No appreciable pleural effusion. No pneumothorax. Telemetry leads overlie the chest. IMPRESSION: Interval improvement in the bilateral diffuse airspace disease. Electronically Signed   By: Misty Stanley M.D.   On: 12/27/2019 07:24   DG Chest Portable 1 View  Result Date: 12/27/2019 CLINICAL DATA:  Intubation EXAM: PORTABLE CHEST 1 VIEW COMPARISON:  Yesterday FINDINGS: Endotracheal tube with tip at the clavicular heads. The enteric tube reaches the stomach. Dense and progressive bilateral chest  opacification with limited aerated lung. Large appearance of the heart but largely obscured. No visible air leak. IMPRESSION: 1. Unremarkable hardware positioning. 2. Severe generalized opacification chest. Electronically Signed   By: Monte Fantasia M.D.   On: 12/27/2019 05:38   DG Chest Port 1 View  Result Date: 12/13/2019 CLINICAL DATA:  Shortness of breath and sepsis. EXAM: PORTABLE CHEST 1 VIEW COMPARISON:  None. FINDINGS: The cardiomediastinal silhouette is unremarkable. Diffuse bilateral airspace opacities are noted. No pneumothorax or large pleural effusion identified. No acute bony abnormalities are present. IMPRESSION: Diffuse bilateral airspace opacities - question edema versus infection/pneumonia. Electronically Signed   By: Margarette Canada M.D.   On: 12/21/2019 14:23   [place] EKG SITE RITE  Result Date: 12/27/2019 If Site Rite image not attached, placement could not be confirmed due to current cardiac rhythm.   Chest x-ray performed today after intubation, independently reviewed:    Subjective:    Overnight Issues: Worsening respiratory distress and hypoxemia required intubation and mechanical ventilation.Issues with vent pressuring and cuff leak  Objective:  Vital signs for last 24 hours: Temp:  [97.3 F (36.3 C)-98.8 F (37.1 C)] 97.6 F (36.4 C) (10/28 1535) Pulse Rate:  [56-78] 58 (10/28 1535) Resp:  [14-24] 19 (10/28 1535) BP: (93-107)/(61-69) 96/64 (10/28 1500) SpO2:  [97 %-100 %] 100 % (10/28 1535) FiO2 (%):  [50 %-65 %] 50 % (10/28 1443) Weight:  [122.3 kg] 122.3 kg (10/28 0500)  Hemodynamic parameters for last 24 hours:    Intake/Output from previous day: 10/27 0701 - 10/28 0700 In: 1102.6 [I.V.:1102.6] Out: 1425 [Urine:1325; Stool:100]  Intake/Output this shift: Total I/O In: 1196.1 [I.V.:375.4; NG/GT:820.7] Out: 235 [Urine:235]  Vent settings for last 24 hours: Vent Mode: PRVC FiO2 (%):  [50 %-65 %] 50 % Set Rate:  [24 bmp] 24 bmp Vt Set:  [400 mL] 400  mL PEEP:  [12 cmH20] 12 cmH20 Plateau Pressure:  [20 cmH20] 20 cmH20  Physical Exam:   physical examination is limited due to need for PPE/CAPR GENERAL: Obese woman, intubated, mechanically ventilated  HEAD: Normocephalic, atraumatic.  EYES: Pupils equal, round, reactive to light.  No scleral icterus.  MOUTH: Orotracheally intubated, OG in place. NECK: Supple. No thyromegaly. Trachea midline. No JVD.  No adenopathy. PULMONARY: rhonchi CARDIOVASCULAR: Monitor shows regular rate and rhythm.  ABDOMEN: Obese, soft, nondistended. MUSCULOSKELETAL: No joint deformity, no clubbing, no edema.  NEUROLOGIC: Sedated on vent. SKIN: Intact,warm,dry.  No overt rash noted.  No skin breakdown on sacral area noted.  Assessment/Plan:   Acute respiratory failure with hypoxia due to COVID-19 infection COVID-19 pneumonia 10/26 Patient has progressed to intubation and mechanical ventilation 6cc/kg targeted volume IV dexamethasone Supportive  care Trend D-dimer and procalcitonin PCT  at this time <0.10,  WBC 10.4 Continue to monitor in ICU Proning if necessary Continue to trend inflammatory markers Ventilator settings adjusted and discussed with RT  Fentanyl disordered breathing, large truncated volumes at a rate of 4-6/min Vecuronium given with resolution, and delta P of perhaps 15-17, peak down from mid 40s to high 20s Will replace fentanyl with dilaudid and reassess Cuff seems to be holding 20 pressure  10/27 Continue to monitor cuff for leak WBC 13.1 Prone as indicated Dilaudid over fentanyl for as above  10/28 FiO2 mild wean, but mechanically roughly the same Difficult to adjust sedation with asynchrony  CXR am   Hypokalemia resolved 4.4 this morning  Hyperglycemia  Likely due to steroids ICU hyperglycemia protocol  Lactic acidosis Resolved with oxygen supplementation Trend lactic acid    LOS: 4 days   Additional comments: Multidisciplinary rounds were performed with  the ICU team.   Patient seen and examined face to face fashion Images of the day and lab values reviewed Critical Care Total Time: 45 Minutes   12/30/2019  *Care during the described time interval was provided by me and/or other providers on the critical care team.  I have reviewed this patient's available data, including medical history, events of note, physical examination and test results as part of my evaluation.  **This note was dictated using voice recognition software/Dragon.  Despite best efforts to proofread, errors can occur which can change the meaning.  Any change was purely unintentional.

## 2019-12-31 DIAGNOSIS — J9600 Acute respiratory failure, unspecified whether with hypoxia or hypercapnia: Secondary | ICD-10-CM | POA: Diagnosis not present

## 2019-12-31 DIAGNOSIS — U071 COVID-19: Secondary | ICD-10-CM | POA: Diagnosis not present

## 2019-12-31 LAB — BASIC METABOLIC PANEL
Anion gap: 10 (ref 5–15)
BUN: 42 mg/dL — ABNORMAL HIGH (ref 6–20)
CO2: 37 mmol/L — ABNORMAL HIGH (ref 22–32)
Calcium: 8.9 mg/dL (ref 8.9–10.3)
Chloride: 101 mmol/L (ref 98–111)
Creatinine, Ser: 0.62 mg/dL (ref 0.44–1.00)
GFR, Estimated: >60 mL/min (ref >60)
Glucose, Bld: 114 mg/dL — ABNORMAL HIGH (ref 70–99)
Potassium: 4.2 mmol/L (ref 3.5–5.1)
Sodium: 148 mmol/L — ABNORMAL HIGH (ref 135–145)

## 2019-12-31 LAB — TRIGLYCERIDES: Triglycerides: 106 mg/dL (ref <150)

## 2019-12-31 LAB — CBC
HCT: 34 % — ABNORMAL LOW (ref 36.0–46.0)
Hemoglobin: 10.8 g/dL — ABNORMAL LOW (ref 12.0–15.0)
MCH: 29.8 pg (ref 26.0–34.0)
MCHC: 31.8 g/dL (ref 30.0–36.0)
MCV: 93.7 fL (ref 80.0–100.0)
Platelets: 336 10*3/uL (ref 150–400)
RBC: 3.63 MIL/uL — ABNORMAL LOW (ref 3.87–5.11)
RDW: 12.9 % (ref 11.5–15.5)
WBC: 13.4 10*3/uL — ABNORMAL HIGH (ref 4.0–10.5)
nRBC: 0.3 % — ABNORMAL HIGH (ref 0.0–0.2)

## 2019-12-31 LAB — BLOOD GAS, ARTERIAL
Acid-Base Excess: 12.5 mmol/L — ABNORMAL HIGH (ref 0.0–2.0)
Bicarbonate: 39.1 mmol/L — ABNORMAL HIGH (ref 20.0–28.0)
FIO2: 0.45
MECHVT: 400 mL
O2 Saturation: 89.4 %
PEEP: 10 cmH2O
Patient temperature: 37
RATE: 24 resp/min
pCO2 arterial: 55 mmHg — ABNORMAL HIGH (ref 32.0–48.0)
pH, Arterial: 7.46 — ABNORMAL HIGH (ref 7.350–7.450)
pO2, Arterial: 54 mmHg — ABNORMAL LOW (ref 83.0–108.0)

## 2019-12-31 LAB — GLUCOSE, CAPILLARY
Glucose-Capillary: 112 mg/dL — ABNORMAL HIGH (ref 70–99)
Glucose-Capillary: 95 mg/dL (ref 70–99)
Glucose-Capillary: 95 mg/dL (ref 70–99)
Glucose-Capillary: 173 mg/dL — ABNORMAL HIGH (ref 70–99)
Glucose-Capillary: 152 mg/dL — ABNORMAL HIGH (ref 70–99)
Glucose-Capillary: 137 mg/dL — ABNORMAL HIGH (ref 70–99)

## 2019-12-31 LAB — PHOSPHORUS: Phosphorus: 3.9 mg/dL (ref 2.5–4.6)

## 2019-12-31 LAB — CULTURE, BLOOD (ROUTINE X 2)
Culture: NO GROWTH
Culture: NO GROWTH
Special Requests: ADEQUATE

## 2019-12-31 LAB — LACTIC ACID, PLASMA: Lactic Acid, Venous: 1.8 mmol/L (ref 0.5–1.9)

## 2019-12-31 LAB — MAGNESIUM: Magnesium: 2.6 mg/dL — ABNORMAL HIGH (ref 1.7–2.4)

## 2019-12-31 MED ORDER — STERILE WATER FOR INJECTION IJ SOLN
INTRAMUSCULAR | Status: AC
  Filled 2019-12-31: qty 10

## 2019-12-31 MED ORDER — ADULT MULTIVITAMIN W/MINERALS CH
1.0000 | ORAL_TABLET | Freq: Every day | ORAL | Status: DC
  Administered 2019-12-31 – 2020-01-19 (×19): 1
  Filled 2019-12-31 (×19): qty 1

## 2019-12-31 NOTE — Progress Notes (Signed)
Nutrition Follow-up  DOCUMENTATION CODES:   Morbid obesity  INTERVENTION:  Continue Vital AF 1.2 Cal at 20 mL/hr (480 mL goal daily volume) + PROSource TF 90 mL QID per tube. Provides 896 kcal, 124 grams of protein, 389 mL H2O daily. With current propofol rate provides 1680 kcal daily.  Provide MVI daily per tube as goal TF regimen does not meet 100% RDIs for vitamins/minerals.  NUTRITION DIAGNOSIS:   Inadequate oral intake related to inability to eat as evidenced by NPO status.  Ongoing.  GOAL:   Patient will meet greater than or equal to 90% of their needs  Met with TF regimen.  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  10/25 intubated 10/26 ETT replaced due to blown cuff  Patient is currently intubated on ventilator support MV: 11.2 L/min Temp (24hrs), Avg:98.3 F (36.8 C), Min:97.6 F (36.4 C), Max:98.8 F (37.1 C)  Propofol: 29.7 ml/hr (784 kcal daily)  Medications reviewed and include: Decadron 10 mg Q24hrs IV, free water 100 mL Q4hrs, Novolog 0-20 units Q4hrs, Protonix, Dilaudid gtt, propofol gtt.  Labs reviewed: CBG 95, Sodium 148, CO2 37, BUN 42, Magnesium 2.6.  I/O: 1360 mL UOP yesterday (0.5 mL/kg/hr)  Weight trend: 120/1 kg on 10/29; + 0.3 kg from 10/26  Enteral Access: 18 Fr. OGT placed 10/25; tip in stomach per chest x-ray 10/26  TF regimen: Vital AF 1.2 Cal at 20 mL/hr + PROSource TF 90 mL QID  Discussed with RN and on rounds.  Diet Order:   Diet Order            Diet NPO time specified  Diet effective now                EDUCATION NEEDS:   No education needs have been identified at this time  Skin:  Skin Assessment: Reviewed RN Assessment  Last BM:  12/31/2019 - medium type 7  Height:   Ht  Readings from Last 1 Encounters:  12/25/2019 5' 6"  (1.676 m)   Weight:   Wt Readings from Last 1 Encounters:  12/31/19 120.1 kg   Ideal Body Weight:  59.1 kg  BMI:  Body mass index is 42.74 kg/m.  Estimated Nutritional Needs:   Kcal:  1700  Protein:  124-134 grams  Fluid:  >/= 2 L/day  Jacklynn Barnacle, MS, RD, LDN Pager number available on Amion

## 2019-12-31 NOTE — Progress Notes (Signed)
CRITICAL CARE NOTE  CC  follow up respiratory failure  SUBJECTIVE Patient remains critically ill Intubated and sedated No sig events      SIGNIFICANT EVENTS None remains intubated and sedated    BP 102/67 (BP Location: Left Arm)    Pulse 66    Temp 98.8 F (37.1 C) (Axillary)    Resp 14    Ht 5' 6" (1.676 m)    Wt 120.1 kg    LMP 08/16/2014    SpO2 92%    BMI 42.74 kg/m    REVIEW OF SYSTEMS  PATIENT IS UNABLE TO PROVIDE COMPLETE REVIEW OF SYSTEMS DUE TO SEVERE CRITICAL ILLNESS /intubated  PHYSICAL EXAMINATION:  GENERAL:critically ill appearing, no resp distress HEAD: Normocephalic, atraumatic.  EYES: Pupils equal, round, reactive to light.  No scleral icterus.  MOUTH: Moist mucosal membrane. NECK: Supple. No thyromegaly. No nodules. No JVD.  PULMONARY:biltaeral few crackles. No rhonci or wheezes CARDIOVASCULAR: S1 and S2. Regular rate and rhythm. No murmurs, rubs, or gallops.  GASTROINTESTINAL: Soft, nontender, -distended. No masses. Positive bowel sounds. No hepatosplenomegaly.  MUSCULOSKELETAL: No swelling, clubbing, or edema.  NEUROLOGIC: intubated and sedated  SKIN:intact,warm,dry  INTAKE/OUTPUT  Intake/Output Summary (Last 24 hours) at 12/31/2019 0946 Last data filed at 12/31/2019 0900 Gross per 24 hour  Intake 1802 ml  Output 1185 ml  Net 617 ml    LABS  CBC Recent Labs  Lab 12/28/19 0503 12/29/19 0455 12/30/19 0511  WBC 13.7* 13.1* 10.4  HGB 12.3 12.0 10.7*  HCT 38.7 38.7 33.8*  PLT 290 268 279   Coag's Recent Labs  Lab 12/05/2019 1329  APTT 30  INR 1.0   BMET Recent Labs  Lab 12/29/19 0455 12/30/19 0511 12/31/19 0438  NA 144 148* 148*  K 4.4 4.4 4.2  CL 104 105 101  CO2 32 34* 37*  BUN 31* 38* 42*  CREATININE 0.60 0.60 0.62  GLUCOSE 149* 133* 114*   Electrolytes Recent Labs  Lab 12/29/19 0455 12/29/19 1701 12/30/19 0511 12/31/19 0438  CALCIUM 8.6*  --  8.7* 8.9  MG 2.8* 2.7*  --  2.6*  PHOS 3.3 2.7  --  3.9    Sepsis Markers Recent Labs  Lab 12/13/2019 1329 12/03/2019 1604 12/07/2019 1804 12/27/19 0537 12/28/19 0503  LATICACIDVEN 2.9* 0.9  --   --   --   PROCALCITON  --   --  <0.10 <0.10 0.13   ABG Recent Labs  Lab 12/27/19 0441  PHART 7.21*  PCO2ART 80*  PO2ART 183*   Liver Enzymes Recent Labs  Lab 12/04/2019 1329 12/27/19 0537  AST 76* 110*  ALT 43 62*  ALKPHOS 38 53  BILITOT 0.7 1.1  ALBUMIN 3.7 3.3*   Cardiac Enzymes No results for input(s): TROPONINI, PROBNP in the last 168 hours. Glucose Recent Labs  Lab 12/30/19 1131 12/30/19 1645 12/30/19 2150 12/30/19 2356 12/31/19 0425 12/31/19 0724  GLUCAP 125* 130* 154* 147* 112* 95     Recent Results (from the past 240 hour(s))  Blood Culture (routine x 2)     Status: None   Collection Time: 12/28/2019  1:29 PM   Specimen: BLOOD  Result Value Ref Range Status   Specimen Description BLOOD LEFT ANTECUBITAL  Final   Special Requests   Final    BOTTLES DRAWN AEROBIC AND ANAEROBIC Blood Culture adequate volume   Culture   Final    NO GROWTH 5 DAYS Performed at [facility], [address], [place], [place] [address]  Report Status 12/31/2019 FINAL  Final  Respiratory Panel by RT PCR (Flu A&B, Covid) - Nasopharyngeal Swab     Status: Abnormal   Collection Time: 12/16/2019  1:30 PM   Specimen: Nasopharyngeal Swab  Result Value Ref Range Status   SARS Coronavirus 2 by RT PCR POSITIVE (A) NEGATIVE Final    Comment: RESULT CALLED TO, READ BACK BY AND VERIFIED WITH: [place] ON 12/16/2019 AT 7654 TIK (NOTE) SARS-CoV-2 target nucleic acids are DETECTED.  SARS-CoV-2 RNA is generally detectable in upper respiratory specimens  during the acute phase of infection. Positive results are indicative of the presence of the identified virus, but do not rule out bacterial infection or co-infection with other pathogens not detected by the test. Clinical correlation with patient history and other diagnostic information  is necessary to determine patient infection status. The expected result is Negative.  Fact Sheet for Patients:  PinkCheek.be  Fact Sheet for Healthcare Providers: GravelBags.it  This test is not yet approved or cleared by the [place] FDA and  has been authorized for detection and/or diagnosis of SARS-CoV-2 by FDA under an Emergency Use Authorization (EUA).  This EUA will remain in effect (meaning this test c an be used) for the duration of  the COVID-19 declaration under Section 564(b)(1) of the Act, 21 U.S.C. section 360bbb-3(b)(1), unless the authorization is terminated or revoked sooner.      Influenza A by PCR NEGATIVE NEGATIVE Final   Influenza B by PCR NEGATIVE NEGATIVE Final    Comment: (NOTE) The Xpert Xpress SARS-CoV-2/FLU/RSV assay is intended as an aid in  the diagnosis of influenza from Nasopharyngeal swab specimens and  should not be used as a sole basis for treatment. Nasal washings and  aspirates are unacceptable for Xpert Xpress SARS-CoV-2/FLU/RSV  testing.  Fact Sheet for Patients: PinkCheek.be  Fact Sheet for Healthcare Providers: GravelBags.it  This test is not yet approved or cleared by the [place] FDA and  has been authorized for detection and/or diagnosis of SARS-CoV-2 by  FDA under an Emergency Use Authorization (EUA). This EUA will remain  in effect (meaning this test can be used) for the duration of the  Covid-19 declaration under Section 564(b)(1) of the Act, 21  U.S.C. section 360bbb-3(b)(1), unless the authorization is  terminated or revoked. Performed at [facility], [address] [place], [place] [address]   Blood Culture (routine x 2)     Status: None   Collection Time: 12/23/2019  1:34 PM   Specimen: BLOOD  Result Value Ref Range Status   Specimen Description BLOOD RIGHT ANTECUBITAL  Final   Special  Requests   Final    BOTTLES DRAWN AEROBIC AND ANAEROBIC Blood Culture results may not be optimal due to an excessive volume of blood received in culture bottles   Culture   Final    NO GROWTH 5 DAYS Performed at [facility], [address], [place], [place] [address]    Report Status 12/31/2019 FINAL  Final  Urine culture     Status: Abnormal   Collection Time: 12/27/19  5:37 AM   Specimen: In/Out Cath Urine  Result Value Ref Range Status   Specimen Description   Final    IN/OUT CATH URINE Performed at [facility], [address] [place], [place] [address]    Special Requests   Final    NONE Performed at [facility], [address] [place], [place] [address]    Culture (A)  Final    <  10,000 COLONIES/mL INSIGNIFICANT GROWTH Performed at [facility], [place] [address] [place], [place] [address]    Report Status 12/28/2019 FINAL  Final    cxr 10/26 as per rad IMPRESSION: 1. Interval retraction of the endotracheal tube with the tip now projecting approximately 2.6 cm above the carina. 2. Slightly improved aeration of the lungs with otherwise similar extensive bilateral airspace opacities, compatible with multifocal pneumonia and reported COVID diagnosis.  MEDICATIONS   Current Facility-Administered Medications:    0.9 %  sodium chloride infusion, 250 mL, Intravenous, Continuous, Darel Hong D, NP, Last Rate: 10 mL/hr at 12/31/19 0600, Rate Verify at 12/31/19 0600   acetaminophen (TYLENOL) tablet 650 mg, 650 mg, Per Tube, Q6H PRN, Hallaji, Sheema M, RPH   chlorhexidine gluconate (MEDLINE KIT) (PERIDEX) 0.12 % solution 15 mL, 15 mL, Mouth Rinse, BID, Vernard Gambles L, MD, 15 mL at 12/31/19 0900   Chlorhexidine Gluconate Cloth 2 % PADS 6 each, 6 each, Topical, Q0600, Darel Hong D, NP, 6 each at 12/29/19 0529   dexamethasone (DECADRON) injection 10 mg, 10 mg, Intravenous, Q24H, Tyler Pita, MD, 10 mg at 12/30/19 1410    docusate (COLACE) 50 MG/5ML liquid 100 mg, 100 mg, Per Tube, BID PRN, Tyler Pita, MD   enoxaparin (LOVENOX) injection 60 mg, 60 mg, Subcutaneous, Q24H, Benita Gutter, RPH, 60 mg at 12/30/19 2152   feeding supplement (PROSource TF) liquid 90 mL, 90 mL, Per Tube, QID, Tyler Pita, MD, 90 mL at 12/30/19 2153   feeding supplement (VITAL AF 1.2 CAL) liquid 1,000 mL, 1,000 mL, Per Tube, Q24H, Tyler Pita, MD, Last Rate: 20 mL/hr at 12/30/19 1500, Rate Verify at 12/30/19 1500   free water 100 mL, 100 mL, Per Tube, Q4H, Darel Hong D, NP, 100 mL at 12/31/19 0811   HYDROmorphone (DILAUDID) 50 mg in sodium chloride 0.9 % 100 mL (0.5 mg/mL) infusion, 2 mg/hr, Intravenous, Continuous, Nelle Don, MD, Last Rate: 4 mL/hr at 12/31/19 0600, 2 mg/hr at 12/31/19 0600   insulin aspart (novoLOG) injection 0-20 Units, 0-20 Units, Subcutaneous, Q4H, Darel Hong D, NP, 3 Units at 12/30/19 2357   MEDLINE mouth rinse, 15 mL, Mouth Rinse, 10 times per day, Tyler Pita, MD, 15 mL at 12/31/19 0602   midazolam (VERSED) injection 2 mg, 2 mg, Intravenous, Q15 min PRN, Darel Hong D, NP, 2 mg at 12/27/19 2330   midazolam (VERSED) injection 2 mg, 2 mg, Intravenous, Q2H PRN, Darel Hong D, NP, 2 mg at 12/27/19 1345   mupirocin ointment (BACTROBAN) 2 % 1 application, 1 application, Nasal, BID, Darel Hong D, NP, 1 application at 07/62/26 0915   ondansetron (ZOFRAN) injection 4 mg, 4 mg, Intravenous, Q6H PRN, Tyler Pita, MD   pantoprazole (PROTONIX) injection 40 mg, 40 mg, Intravenous, Q12H, Tyler Pita, MD, 40 mg at 12/31/19 0901   polyethylene glycol (MIRALAX / GLYCOLAX) packet 17 g, 17 g, Per Tube, Daily PRN, Tyler Pita, MD   propofol (DIPRIVAN) 1000 MG/100ML infusion, 0-50 mcg/kg/min, Intravenous, Continuous, Darel Hong D, NP, Last Rate: 29.7 mL/hr at 12/31/19 0656, 40 mcg/kg/min at 12/31/19 0656   sodium chloride flush (NS) 0.9 %  injection 10-40 mL, 10-40 mL, Intracatheter, Q12H, Tyler Pita, MD, 10 mL at 12/31/19 0902   sodium chloride flush (NS) 0.9 % injection 10-40 mL, 10-40 mL, Intracatheter, PRN, Tyler Pita, MD, 10 mL at 12/27/19 2114   vecuronium (NORCURON) injection 10 mg, 10 mg, Intravenous, Q1H PRN, Dewaine Conger,  Denyce Robert, NP, 10 mg at 12/29/19 2336      Indwelling Urinary Catheter continued, requirement due to   Reason to continue Indwelling Urinary Catheter for strict Intake/Output monitoring for hemodynamic instability   Central Line continued, requirement due to   Reason to continue [employer] Monitoring of central venous pressure or other hemodynamic parameters   Ventilator continued, requirement due to, resp failure    Ventilator Sedation RASS 0 to -2     ASSESSMENT AND PLAN SYNOPSIS Acute respiratory failure with hypoxia due to COVID-19 infection COVID-19 pneumonia 10/26 Patient has progressed to intubation and mechanical ventilation 6cc/kg targeted volume IV dexamethasone Supportive care Trend D-dimer and procalcitonin PCT   <0.10,  WBC normal Continue to monitor in ICU Proning if necessary Continue to trend inflammatory markers Ventilator settings adjusted and discussed with RT fio2 decreased to 45% cont peep 10 Cont propofol/dilaudid     Hypokalemia resolved  Hypernatremia, increase water thru OG/NG tube  Hyperglycemia  Likely due to steroids ICU hyperglycemia protocol  Lactic acidosis Resolved with oxygen supplementation Trend lactic acid  She remains critically ill, prognosis is guarded Time spent 45 minutes  Rosine Door, MD  12/31/2019 9:46 AM [facility]

## 2019-12-31 NOTE — Progress Notes (Signed)
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

## 2020-01-01 ENCOUNTER — Inpatient Hospital Stay: Payer: BC Managed Care – PPO

## 2020-01-01 DIAGNOSIS — U071 COVID-19: Secondary | ICD-10-CM | POA: Diagnosis not present

## 2020-01-01 DIAGNOSIS — J9600 Acute respiratory failure, unspecified whether with hypoxia or hypercapnia: Secondary | ICD-10-CM | POA: Diagnosis not present

## 2020-01-01 LAB — BLOOD GAS, ARTERIAL
Acid-Base Excess: 13.5 mmol/L — ABNORMAL HIGH (ref 0.0–2.0)
Acid-Base Excess: 19.4 mmol/L — ABNORMAL HIGH (ref 0.0–2.0)
Bicarbonate: 40.2 mmol/L — ABNORMAL HIGH (ref 20.0–28.0)
Bicarbonate: 46.6 mmol/L — ABNORMAL HIGH (ref 20.0–28.0)
FIO2: 0.55
FIO2: 0.9
MECHVT: 400 mL
MECHVT: 400 mL
O2 Saturation: 94.1 %
O2 Saturation: 97.7 %
PEEP: 10 cmH2O
PEEP: 14 cmH2O
Patient temperature: 37
Patient temperature: 37
RATE: 24 resp/min
RATE: 24 resp/min
pCO2 arterial: 62 mmHg — ABNORMAL HIGH (ref 32.0–48.0)
pCO2 arterial: 67 mmHg (ref 32.0–48.0)
pH, Arterial: 7.42 (ref 7.350–7.450)
pH, Arterial: 7.45 (ref 7.350–7.450)
pO2, Arterial: 70 mmHg — ABNORMAL LOW (ref 83.0–108.0)
pO2, Arterial: 95 mmHg (ref 83.0–108.0)

## 2020-01-01 LAB — CBC
HCT: 33.1 % — ABNORMAL LOW (ref 36.0–46.0)
Hemoglobin: 10.5 g/dL — ABNORMAL LOW (ref 12.0–15.0)
MCH: 30.2 pg (ref 26.0–34.0)
MCHC: 31.7 g/dL (ref 30.0–36.0)
MCV: 95.1 fL (ref 80.0–100.0)
Platelets: 314 10*3/uL (ref 150–400)
RBC: 3.48 MIL/uL — ABNORMAL LOW (ref 3.87–5.11)
RDW: 13.1 % (ref 11.5–15.5)
WBC: 11.6 10*3/uL — ABNORMAL HIGH (ref 4.0–10.5)
nRBC: 0.4 % — ABNORMAL HIGH (ref 0.0–0.2)

## 2020-01-01 LAB — COMPREHENSIVE METABOLIC PANEL
ALT: 66 U/L — ABNORMAL HIGH (ref 0–44)
AST: 34 U/L (ref 15–41)
Albumin: 2.5 g/dL — ABNORMAL LOW (ref 3.5–5.0)
Alkaline Phosphatase: 46 U/L (ref 38–126)
Anion gap: 11 (ref 5–15)
BUN: 39 mg/dL — ABNORMAL HIGH (ref 6–20)
CO2: 35 mmol/L — ABNORMAL HIGH (ref 22–32)
Calcium: 8.3 mg/dL — ABNORMAL LOW (ref 8.9–10.3)
Chloride: 102 mmol/L (ref 98–111)
Creatinine, Ser: 0.55 mg/dL (ref 0.44–1.00)
GFR, Estimated: >60 mL/min (ref >60)
Glucose, Bld: 115 mg/dL — ABNORMAL HIGH (ref 70–99)
Potassium: 4.1 mmol/L (ref 3.5–5.1)
Sodium: 148 mmol/L — ABNORMAL HIGH (ref 135–145)
Total Bilirubin: 0.7 mg/dL (ref 0.3–1.2)
Total Protein: 6.6 g/dL (ref 6.5–8.1)

## 2020-01-01 LAB — FERRITIN: Ferritin: 754 ng/mL — ABNORMAL HIGH (ref 11–307)

## 2020-01-01 LAB — MAGNESIUM: Magnesium: 2.5 mg/dL — ABNORMAL HIGH (ref 1.7–2.4)

## 2020-01-01 LAB — GLUCOSE, CAPILLARY
Glucose-Capillary: 113 mg/dL — ABNORMAL HIGH (ref 70–99)
Glucose-Capillary: 90 mg/dL (ref 70–99)
Glucose-Capillary: 126 mg/dL — ABNORMAL HIGH (ref 70–99)
Glucose-Capillary: 120 mg/dL — ABNORMAL HIGH (ref 70–99)
Glucose-Capillary: 171 mg/dL — ABNORMAL HIGH (ref 70–99)

## 2020-01-01 LAB — PHOSPHORUS: Phosphorus: 5.5 mg/dL — ABNORMAL HIGH (ref 2.5–4.6)

## 2020-01-01 LAB — FIBRIN DERIVATIVES D-DIMER (ARMC ONLY): Fibrin derivatives D-dimer (ARMC): 1712.12 ng/mL (FEU) — ABNORMAL HIGH (ref 0.00–499.00)

## 2020-01-01 LAB — C-REACTIVE PROTEIN: CRP: 6.6 mg/dL — ABNORMAL HIGH (ref <1.0)

## 2020-01-01 LAB — TRIGLYCERIDES: Triglycerides: 102 mg/dL (ref <150)

## 2020-01-01 MED ORDER — MIDAZOLAM HCL 2 MG/2ML IJ SOLN
2.0000 mg | INTRAMUSCULAR | Status: DC | PRN
  Administered 2020-01-01 – 2020-01-02 (×2): 2 mg via INTRAVENOUS
  Filled 2020-01-01 (×3): qty 2

## 2020-01-01 MED ORDER — CISATRACURIUM BOLUS VIA INFUSION
0.1000 mg/kg | Freq: Once | INTRAVENOUS | Status: AC
  Administered 2020-01-01: 11.9 mg via INTRAVENOUS
  Filled 2020-01-01: qty 12

## 2020-01-01 MED ORDER — FREE WATER
200.0000 mL | Status: DC
  Administered 2020-01-01 – 2020-01-08 (×42): 200 mL

## 2020-01-01 MED ORDER — DEXAMETHASONE SODIUM PHOSPHATE 10 MG/ML IJ SOLN
6.0000 mg | INTRAMUSCULAR | Status: DC
  Administered 2020-01-01 – 2020-01-04 (×4): 6 mg via INTRAVENOUS
  Filled 2020-01-01 (×5): qty 0.6

## 2020-01-01 MED ORDER — CHLORHEXIDINE GLUCONATE CLOTH 2 % EX PADS
6.0000 | MEDICATED_PAD | Freq: Every day | CUTANEOUS | Status: DC
  Administered 2020-01-01 – 2020-01-19 (×17): 6 via TOPICAL

## 2020-01-01 MED ORDER — SODIUM CHLORIDE 0.9 % IV SOLN
0.0000 ug/kg/min | INTRAVENOUS | Status: DC
  Administered 2020-01-01: 3 ug/kg/min via INTRAVENOUS
  Filled 2020-01-01: qty 20

## 2020-01-01 MED ORDER — ARTIFICIAL TEARS OPHTHALMIC OINT
1.0000 "application " | TOPICAL_OINTMENT | Freq: Three times a day (TID) | OPHTHALMIC | Status: DC
  Administered 2020-01-01 – 2020-01-09 (×24): 1 via OPHTHALMIC
  Filled 2020-01-01: qty 1

## 2020-01-01 NOTE — Progress Notes (Signed)
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

## 2020-01-01 NOTE — Progress Notes (Addendum)
CRITICAL CARE NOTE  CC  follow up respiratory failure  SUBJECTIVE Patient remains critically ill Intubated and sedated Prognosis is guarded     SIGNIFICANT EVENTS Drop in spo2 requiring increase  in fio2 and peep to 80% and 14 respectively   BP 111/71 (BP Location: Left Wrist)   Pulse 73   Temp 98.8 F (37.1 C) (Axillary)   Resp (!) 23   Ht _0  (1.676 m)   Wt 118.8 kg   LMP 08/16/2014   SpO2 100%   BMI 42.27 kg/m    REVIEW OF SYSTEMS  PATIENT IS UNABLE TO PROVIDE COMPLETE REVIEW OF SYSTEMS DUE TO SEVERE CRITICAL ILLNESS   PHYSICAL EXAMINATION:  GENERAL:critically ill appearing, noresp distress HEAD: Normocephalic, atraumatic.  EYES: Pupils equal, round, reactive to light.  No scleral icterus.  MOUTH: Moist mucosal membrane. NECK: Supple. No thyromegaly. No nodules. No JVD.  PULMONARY: no rhonci or wheezing CARDIOVASCULAR: S1 and S2. Regular rate and rhythm. No murmurs, rubs, or gallops.  GASTROINTESTINAL: Soft, nontender, -distended. No masses. Positive bowel sounds. No hepatosplenomegaly.  MUSCULOSKELETAL: No swelling, clubbing, or edema.  NEUROLOGIC: sedated SKIN:intact,warm,dry  INTAKE/OUTPUT  Intake/Output Summary (Last 24 hours) at 01/01/2020 1335 Last data filed at 01/01/2020 0505 Gross per 24 hour  Intake 982.56 ml  Output 1150 ml  Net -167.44 ml    LABS  CBC Recent Labs  Lab 12/30/19 0511 12/31/19 1122 01/01/20 0409  WBC 10.4 13.4* 11.6*  HGB 10.7* 10.8* 10.5*  HCT 33.8* 34.0* 33.1*  PLT 279 336 314   Coag's Recent Labs  Lab 12/17/2019 1329  APTT 30  INR 1.0   BMET Recent Labs  Lab 12/30/19 0511 12/31/19 0438 01/01/20 0409  NA 148* 148* 148*  K 4.4 4.2 4.1  CL 105 101 102  CO2 34* 37* 35*  BUN 38* 42* 39*  CREATININE 0.60 0.62 0.55  GLUCOSE 133* 114* 115*   Electrolytes Recent Labs  Lab 12/29/19 0455 12/29/19 1701 12/30/19 0511 12/31/19 0438 01/01/20 0409  CALCIUM   < >  --  8.7* 8.9 8.3*  MG  --  2.7*  --   2.6* 2.5*  PHOS  --  2.7  --  3.9 5.5*   < > = values in this interval not displayed.   Sepsis Markers Recent Labs  Lab 12/25/2019 1329 12/19/2019 1604 12/10/2019 1804 12/27/19 0537 12/28/19 0503 12/31/19 1330  LATICACIDVEN 2.9* 0.9  --   --   --  1.8  PROCALCITON  --   --  <0.10 <0.10 0.13  --    ABG Recent Labs  Lab 12/31/19 1108 01/01/20 0500 01/01/20 1205  PHART 7.46* 7.42 7.45  PCO2ART 55* 62* 67*  PO2ART 54* 70* 95   Liver Enzymes Recent Labs  Lab 12/08/2019 1329 12/27/19 0537 01/01/20 0409  AST 76* 110* 34  ALT 43 62* 66*  ALKPHOS 38 53 46  BILITOT 0.7 1.1 0.7  ALBUMIN 3.7 3.3* 2.5*   Cardiac Enzymes No results for input(s): TROPONINI, PROBNP in the last 168 hours. Glucose Recent Labs  Lab 12/31/19 1555 12/31/19 1954 12/31/19 2259 01/01/20 0357 01/01/20 0742 01/01/20 1118  GLUCAP 173* 152* 137* 113* 90 126*     Recent Results (from the past 240 hour(s))  Blood Culture (routine x 2)     Status: None   Collection Time: 12/19/2019  1:29 PM   Specimen: BLOOD  Result Value Ref Range Status   Specimen Description BLOOD LEFT ANTECUBITAL  Final   Special Requests  Final    BOTTLES DRAWN AEROBIC AND ANAEROBIC Blood Culture adequate volume   Culture   Final    NO GROWTH 5 DAYS Performed at [facility], [address] [place], [place] [address]    Report Status 12/31/2019 FINAL  Final  Respiratory Panel by RT PCR (Flu A&B, Covid) - Nasopharyngeal Swab     Status: Abnormal   Collection Time: 12/10/2019  1:30 PM   Specimen: Nasopharyngeal Swab  Result Value Ref Range Status   SARS Coronavirus 2 by RT PCR POSITIVE (A) NEGATIVE Final    Comment: RESULT CALLED TO, READ BACK BY AND VERIFIED WITH: [place] ON 12/06/2019 AT 8563 TIK (NOTE) SARS-CoV-2 target nucleic acids are DETECTED.  SARS-CoV-2 RNA is generally detectable in upper respiratory specimens  during the acute phase of infection. Positive results are indicative of the presence of  the identified virus, but do not rule out bacterial infection or co-infection with other pathogens not detected by the test. Clinical correlation with patient history and other diagnostic information is necessary to determine patient infection status. The expected result is Negative.  Fact Sheet for Patients:  PinkCheek.be  Fact Sheet for Healthcare Providers: GravelBags.it  This test is not yet approved or cleared by the [place] FDA and  has been authorized for detection and/or diagnosis of SARS-CoV-2 by FDA under an Emergency Use Authorization (EUA).  This EUA will remain in effect (meaning this test c an be used) for the duration of  the COVID-19 declaration under Section 564(b)(1) of the Act, 21 U.S.C. section 360bbb-3(b)(1), unless the authorization is terminated or revoked sooner.      Influenza A by PCR NEGATIVE NEGATIVE Final   Influenza B by PCR NEGATIVE NEGATIVE Final    Comment: (NOTE) The Xpert Xpress SARS-CoV-2/FLU/RSV assay is intended as an aid in  the diagnosis of influenza from Nasopharyngeal swab specimens and  should not be used as a sole basis for treatment. Nasal washings and  aspirates are unacceptable for Xpert Xpress SARS-CoV-2/FLU/RSV  testing.  Fact Sheet for Patients: PinkCheek.be  Fact Sheet for Healthcare Providers: GravelBags.it  This test is not yet approved or cleared by the [place] FDA and  has been authorized for detection and/or diagnosis of SARS-CoV-2 by  FDA under an Emergency Use Authorization (EUA). This EUA will remain  in effect (meaning this test can be used) for the duration of the  Covid-19 declaration under Section 564(b)(1) of the Act, 21  U.S.C. section 360bbb-3(b)(1), unless the authorization is  terminated or revoked. Performed at [facility], [address], [place] [address]    Blood Culture (routine x 2)     Status: None   Collection Time: 12/29/2019  1:34 PM   Specimen: BLOOD  Result Value Ref Range Status   Specimen Description BLOOD RIGHT ANTECUBITAL  Final   Special Requests   Final    BOTTLES DRAWN AEROBIC AND ANAEROBIC Blood Culture results may not be optimal due to an excessive volume of blood received in culture bottles   Culture   Final    NO GROWTH 5 DAYS Performed at [facility], [address], [place], [place] [address]    Report Status 12/31/2019 FINAL  Final  Urine culture     Status: Abnormal   Collection Time: 12/27/19  5:37 AM   Specimen: In/Out Cath Urine  Result Value Ref Range Status   Specimen Description   Final    IN/OUT CATH URINE Performed at [facility], [address] [place], [place] [address]    Special Requests   Final    NONE Performed at [facility], [address] [place], [place] [address]    Culture (A)  Final    <10,000 COLONIES/mL INSIGNIFICANT GROWTH Performed at [facility] [address], [place], [place] [address]    Report Status 12/28/2019 FINAL  Final    cxr 10/30  persistent bilateral infilterates but improved  MEDICATIONS   Current Facility-Administered Medications:  .  0.9 %  sodium chloride infusion, 250 mL, Intravenous, Continuous, Darel Hong D, NP, Last Rate: 10 mL/hr at 01/01/20 0505, Rate Verify at 01/01/20 0505 .  acetaminophen (TYLENOL) tablet 650 mg, 650 mg, Per Tube, Q6H PRN, Hallaji, Sheema M, RPH .  chlorhexidine gluconate (MEDLINE KIT) (PERIDEX) 0.12 % solution 15 mL, 15 mL, Mouth Rinse, BID, Tyler Pita, MD, 15 mL at 01/01/20 0748 .  Chlorhexidine Gluconate Cloth 2 % PADS 6 each, 6 each, Topical, Daily, Rosine Door, MD, 6 each at 01/01/20 1246 .  dexamethasone (DECADRON) injection 10 mg, 10 mg, Intravenous, Q24H, Tyler Pita, MD, 10 mg at 12/31/19 1321 .  docusate (COLACE) 50 MG/5ML liquid 100 mg, 100 mg, Per Tube, BID  PRN, Tyler Pita, MD .  enoxaparin (LOVENOX) injection 60 mg, 60 mg, Subcutaneous, Q24H, Benita Gutter, RPH, 60 mg at 12/31/19 1957 .  feeding supplement (PROSource TF) liquid 90 mL, 90 mL, Per Tube, QID, Tyler Pita, MD, 90 mL at 01/01/20 1312 .  feeding supplement (VITAL AF 1.2 CAL) liquid 1,000 mL, 1,000 mL, Per Tube, Q24H, Tyler Pita, MD, Last Rate: 20 mL/hr at 12/31/19 1329, 1,000 mL at 12/31/19 1329 .  free water 200 mL, 200 mL, Per Tube, Q4H, Dallie Piles, RPH, 200 mL at 01/01/20 1213 .  HYDROmorphone (DILAUDID) 50 mg in sodium chloride 0.9 % 100 mL (0.5 mg/mL) infusion, 2 mg/hr, Intravenous, Continuous, Nelle Don, MD, Last Rate: 4 mL/hr at 01/01/20 1312, 2 mg/hr at 01/01/20 1312 .  insulin aspart (novoLOG) injection 0-20 Units, 0-20 Units, Subcutaneous, Q4H, Darel Hong D, NP, 3 Units at 01/01/20 1246 .  MEDLINE mouth rinse, 15 mL, Mouth Rinse, 10 times per day, Tyler Pita, MD, 15 mL at 01/01/20 1312 .  midazolam (VERSED) injection 2 mg, 2 mg, Intravenous, Q2H PRN, Darel Hong D, NP, 2 mg at 12/27/19 1345 .  multivitamin with minerals tablet 1 tablet, 1 tablet, Per Tube, Daily, Rosine Door, MD, 1 tablet at 01/01/20 [phone] .  ondansetron (ZOFRAN) injection 4 mg, 4 mg, Intravenous, Q6H PRN, Tyler Pita, MD .  pantoprazole (PROTONIX) injection 40 mg, 40 mg, Intravenous, Q12H, Tyler Pita, MD, 40 mg at 01/01/20 0903 .  polyethylene glycol (MIRALAX / GLYCOLAX) packet 17 g, 17 g, Per Tube, Daily PRN, Tyler Pita, MD .  propofol (DIPRIVAN) 1000 MG/100ML infusion, 0-50 mcg/kg/min, Intravenous, Continuous, Darel Hong D, NP, Last Rate: 20 mL/hr at 01/01/20 1310, 26.925 mcg/kg/min at 01/01/20 1310 .  sodium chloride flush (NS) 0.9 % injection 10-40 mL, 10-40 mL, Intracatheter, Q12H, Tyler Pita, MD, 10 mL at 01/01/20 0903 .  sodium chloride flush (NS) 0.9 % injection 10-40 mL, 10-40 mL, Intracatheter, PRN, Tyler Pita, MD, 10 mL at 12/27/19 2114 .  vecuronium (NORCURON) injection 10 mg, 10 mg, Intravenous, Q1H PRN, Darel Hong D, NP, 10 mg at 01/01/20 3202      Indwelling Urinary Catheter continued, requirement due  to   Reason to continue Indwelling Urinary Catheter for strict Intake/Output monitoring for hemodynamic instability         Ventilator continued, requirement due to, resp failure    Ventilator Sedation RASS 0 to -2     ASSESSMENT AND PLAN SYNOPSIS Acute respiratory failure with hypoxia due to COVID-19 infection COVID-19 pneumonia  intubated 10/26, continues to be vent dependent At present PRVC 20, 432m 80%, peep 14 6cc/kg targeted volume Wean o2/peep as tolerated IV dexamethasone, last dose 01/05/20 Supportive care Trend D-dimer and procalcitonin PCT  <0.10,  WBC normal Continue to monitor in ICU Proning if necessary Continue to trend inflammatory markers, 01/01/20  CRP 15.5, ferritin 754,fibrin derivatives 1404  Cont propofol/dilaudid( triglycerides  102 ,10/30)  mild non sp troponin elevation   Hypokalemia resolved  Hypernatremia, increase water thru OG/NG tube  Hyperglycemia  Likely due to steroids ICU hyperglycemia protocol  Lactic acidosis Resolved with oxygen supplementation    Critical Care Time devoted to patient care services described in this note is 40  minutes.   Overall, patient is critically ill, prognosis is guarded.  Patient with Multiorgan failure and at high risk for cardiac arrest and death.   Her husband EZandra Lajeunessewas called and updated on her condition, treatment and prognosis.  He had multiple questions which were answered.  He said that if she needed tracheostomy then that would be fine with the family.  He was thankful for the call.   KRosine Door MD  01/01/2020 1:35 PM [facility]ler[facility]ulmonary & Critical Care Medicine

## 2020-01-02 ENCOUNTER — Inpatient Hospital Stay: Payer: BC Managed Care – PPO

## 2020-01-02 ENCOUNTER — Encounter: Payer: Self-pay | Admitting: Pulmonary Disease

## 2020-01-02 DIAGNOSIS — U071 COVID-19: Secondary | ICD-10-CM | POA: Diagnosis not present

## 2020-01-02 DIAGNOSIS — J9600 Acute respiratory failure, unspecified whether with hypoxia or hypercapnia: Secondary | ICD-10-CM | POA: Diagnosis not present

## 2020-01-02 LAB — COMPREHENSIVE METABOLIC PANEL
ALT: 78 U/L — ABNORMAL HIGH (ref 0–44)
AST: 51 U/L — ABNORMAL HIGH (ref 15–41)
Albumin: 2.3 g/dL — ABNORMAL LOW (ref 3.5–5.0)
Alkaline Phosphatase: 58 U/L (ref 38–126)
Anion gap: 7 (ref 5–15)
BUN: 36 mg/dL — ABNORMAL HIGH (ref 6–20)
CO2: 40 mmol/L — ABNORMAL HIGH (ref 22–32)
Calcium: 8.5 mg/dL — ABNORMAL LOW (ref 8.9–10.3)
Chloride: 99 mmol/L (ref 98–111)
Creatinine, Ser: 0.66 mg/dL (ref 0.44–1.00)
GFR, Estimated: >60 mL/min (ref >60)
Glucose, Bld: 109 mg/dL — ABNORMAL HIGH (ref 70–99)
Potassium: 4.5 mmol/L (ref 3.5–5.1)
Sodium: 146 mmol/L — ABNORMAL HIGH (ref 135–145)
Total Bilirubin: 0.7 mg/dL (ref 0.3–1.2)
Total Protein: 6.7 g/dL (ref 6.5–8.1)

## 2020-01-02 LAB — CBC
HCT: 33.8 % — ABNORMAL LOW (ref 36.0–46.0)
Hemoglobin: 10.4 g/dL — ABNORMAL LOW (ref 12.0–15.0)
MCH: 29.5 pg (ref 26.0–34.0)
MCHC: 30.8 g/dL (ref 30.0–36.0)
MCV: 95.8 fL (ref 80.0–100.0)
Platelets: 310 10*3/uL (ref 150–400)
RBC: 3.53 MIL/uL — ABNORMAL LOW (ref 3.87–5.11)
RDW: 13.1 % (ref 11.5–15.5)
WBC: 12.2 10*3/uL — ABNORMAL HIGH (ref 4.0–10.5)
nRBC: 0.2 % (ref 0.0–0.2)

## 2020-01-02 LAB — BLOOD GAS, ARTERIAL
Acid-Base Excess: 14.2 mmol/L — ABNORMAL HIGH (ref 0.0–2.0)
Acid-Base Excess: 16.7 mmol/L — ABNORMAL HIGH (ref 0.0–2.0)
Acid-Base Excess: 18 mmol/L — ABNORMAL HIGH (ref 0.0–2.0)
Acid-Base Excess: 21.2 mmol/L — ABNORMAL HIGH (ref 0.0–2.0)
Bicarbonate: 45.3 mmol/L — ABNORMAL HIGH (ref 20.0–28.0)
Bicarbonate: 48 mmol/L — ABNORMAL HIGH (ref 20.0–28.0)
Bicarbonate: 49.1 mmol/L — ABNORMAL HIGH (ref 20.0–28.0)
Bicarbonate: 50.5 mmol/L — ABNORMAL HIGH (ref 20.0–28.0)
FIO2: 0.6
FIO2: 0.7
FIO2: 0.75
FIO2: 0.75
MECHVT: 400 mL
MECHVT: 400 mL
MECHVT: 400 mL
MECHVT: 430 mL
Mechanical Rate: 24
O2 Saturation: 88.9 %
O2 Saturation: 94.3 %
O2 Saturation: 95.7 %
O2 Saturation: 98 %
PEEP: 14 cmH2O
PEEP: 14 cmH2O
PEEP: 14 cmH2O
PEEP: 14 cmH2O
Patient temperature: 37
Patient temperature: 37
Patient temperature: 37
Patient temperature: 37
RATE: 30 resp/min
RATE: 30 {breaths}/min
RATE: 30 {breaths}/min
pCO2 arterial: 110 mmHg (ref 32.0–48.0)
pCO2 arterial: 74 mmHg (ref 32.0–48.0)
pCO2 arterial: 90 mmHg (ref 32.0–48.0)
pCO2 arterial: 91 mmHg (ref 32.0–48.0)
pH, Arterial: 7.27 — ABNORMAL LOW (ref 7.350–7.450)
pH, Arterial: 7.31 — ABNORMAL LOW (ref 7.350–7.450)
pH, Arterial: 7.33 — ABNORMAL LOW (ref 7.350–7.450)
pH, Arterial: 7.43 (ref 7.350–7.450)
pO2, Arterial: 112 mmHg — ABNORMAL HIGH (ref 83.0–108.0)
pO2, Arterial: 64 mmHg — ABNORMAL LOW (ref 83.0–108.0)
pO2, Arterial: 70 mmHg — ABNORMAL LOW (ref 83.0–108.0)
pO2, Arterial: 85 mmHg (ref 83.0–108.0)

## 2020-01-02 LAB — GLUCOSE, CAPILLARY
Glucose-Capillary: 100 mg/dL — ABNORMAL HIGH (ref 70–99)
Glucose-Capillary: 104 mg/dL — ABNORMAL HIGH (ref 70–99)
Glucose-Capillary: 116 mg/dL — ABNORMAL HIGH (ref 70–99)
Glucose-Capillary: 126 mg/dL — ABNORMAL HIGH (ref 70–99)
Glucose-Capillary: 148 mg/dL — ABNORMAL HIGH (ref 70–99)
Glucose-Capillary: 150 mg/dL — ABNORMAL HIGH (ref 70–99)
Glucose-Capillary: 165 mg/dL — ABNORMAL HIGH (ref 70–99)

## 2020-01-02 LAB — PHOSPHORUS: Phosphorus: 5.6 mg/dL — ABNORMAL HIGH (ref 2.5–4.6)

## 2020-01-02 LAB — PROCALCITONIN: Procalcitonin: 0.53 ng/mL

## 2020-01-02 LAB — MAGNESIUM: Magnesium: 2.7 mg/dL — ABNORMAL HIGH (ref 1.7–2.4)

## 2020-01-02 LAB — TRIGLYCERIDES: Triglycerides: 93 mg/dL (ref <150)

## 2020-01-02 MED ORDER — VECURONIUM BROMIDE 10 MG IV SOLR: 10.0000 mg | INTRAVENOUS | Status: DC | PRN

## 2020-01-02 MED ORDER — VECURONIUM BROMIDE 10 MG IV SOLR
0.0000 ug/kg/min | INTRAVENOUS | Status: DC
  Administered 2020-01-02: 0.8 ug/kg/min via INTRAVENOUS
  Administered 2020-01-02: 1 ug/kg/min via INTRAVENOUS
  Administered 2020-01-02: 1.2 ug/kg/min via INTRAVENOUS
  Filled 2020-01-02 (×2): qty 100

## 2020-01-02 MED ORDER — FUROSEMIDE 10 MG/ML IJ SOLN
20.0000 mg | Freq: Once | INTRAMUSCULAR | Status: AC
  Administered 2020-01-02: 20 mg via INTRAVENOUS
  Filled 2020-01-02: qty 2

## 2020-01-02 MED ORDER — MIDAZOLAM 50MG/50ML (1MG/ML) PREMIX INFUSION
0.5000 mg/h | INTRAVENOUS | Status: DC
  Administered 2020-01-02: 2 mg/h via INTRAVENOUS
  Administered 2020-01-03: 10 mg/h via INTRAVENOUS
  Administered 2020-01-03 (×2): 8 mg/h via INTRAVENOUS
  Administered 2020-01-03: 9 mg/h via INTRAVENOUS
  Administered 2020-01-04 – 2020-01-06 (×13): 10 mg/h via INTRAVENOUS
  Administered 2020-01-07: 9 mg/h via INTRAVENOUS
  Administered 2020-01-07 – 2020-01-09 (×12): 10 mg/h via INTRAVENOUS
  Filled 2020-01-02 (×32): qty 50

## 2020-01-02 MED ORDER — VECURONIUM BROMIDE 10 MG IV SOLR
10.0000 mg | INTRAVENOUS | Status: DC | PRN
  Administered 2020-01-02: 10 mg via INTRAVENOUS
  Filled 2020-01-02: qty 10

## 2020-01-02 MED ORDER — VITAL AF 1.2 CAL PO LIQD
1000.0000 mL | ORAL | Status: DC
  Administered 2020-01-02 – 2020-01-03 (×2): 1000 mL

## 2020-01-02 MED ORDER — VECURONIUM BOLUS VIA INFUSION
10.0000 mg | Freq: Once | INTRAVENOUS | Status: AC
  Administered 2020-01-02: 10 mg via INTRAVENOUS
  Filled 2020-01-02: qty 10

## 2020-01-02 MED ORDER — SODIUM CHLORIDE 0.9 % IV SOLN
2.0000 g | Freq: Three times a day (TID) | INTRAVENOUS | Status: DC
  Administered 2020-01-02 – 2020-01-06 (×11): 2 g via INTRAVENOUS
  Filled 2020-01-02 (×14): qty 2

## 2020-01-02 MED ORDER — FUROSEMIDE 10 MG/ML IJ SOLN
20.0000 mg | Freq: Two times a day (BID) | INTRAMUSCULAR | Status: AC
  Administered 2020-01-03 – 2020-01-04 (×4): 20 mg via INTRAVENOUS
  Filled 2020-01-02 (×4): qty 2

## 2020-01-02 MED ORDER — DIPHENHYDRAMINE HCL 50 MG/ML IJ SOLN
50.0000 mg | Freq: Once | INTRAMUSCULAR | Status: AC
  Administered 2020-01-02: 50 mg via INTRAVENOUS
  Filled 2020-01-02: qty 1

## 2020-01-02 MED ORDER — FUROSEMIDE 10 MG/ML IJ SOLN: 20.0000 mg | Freq: Once | INTRAMUSCULAR | Status: DC

## 2020-01-02 MED ORDER — MIDAZOLAM HCL 2 MG/2ML IJ SOLN
2.0000 mg | INTRAMUSCULAR | Status: DC | PRN
  Administered 2020-01-06: 2 mg via INTRAVENOUS
  Administered 2020-01-08: 4 mg via INTRAVENOUS
  Administered 2020-01-16: 2 mg via INTRAVENOUS
  Administered 2020-01-16: 4 mg via INTRAVENOUS
  Filled 2020-01-02: qty 4
  Filled 2020-01-02: qty 2
  Filled 2020-01-02: qty 4
  Filled 2020-01-02 (×2): qty 2

## 2020-01-02 MED ORDER — ACETAMINOPHEN 325 MG PO TABS
650.0000 mg | ORAL_TABLET | Freq: Once | ORAL | Status: AC
  Administered 2020-01-02: 650 mg
  Filled 2020-01-02: qty 2

## 2020-01-02 MED ORDER — VECURONIUM BROMIDE 10 MG IV SOLR
8.0000 mg | INTRAVENOUS | Status: DC | PRN
  Administered 2020-01-02 – 2020-01-03 (×5): 8 mg via INTRAVENOUS
  Filled 2020-01-02 (×5): qty 10

## 2020-01-02 NOTE — Progress Notes (Signed)
Vecuronium drip has been discontinued and pt is started on prn verconium 8mg  IV push per MD order. RASS goal remains at -3 to -4, pt continues to be synchronous with the ventilator towards end of shift.

## 2020-01-02 NOTE — Consult Note (Signed)
Pharmacy Antibiotic Note  Carrie Davies is a 54 y.o. female admitted on 2020-01-09 with severe COVID-19 pneumonia with respiratory failure requiring mechanical ventilation.  Pharmacy has been consulted for cefepime dosing. Patient positive for SARS-CoV-2 2020/01/09 on admission. Now with new-onset fevers.   CRP appears to be down-trending , D-dimer trending up. Leukocytosis overall stable. PCT from 10/26 was 0.13.   Plan: Cefepime 2 g IV q8h  Height: 5\' 6"  (167.6 cm) Weight: 121.3 kg (267 lb 6.7 oz) IBW/kg (Calculated) : 59.3  Temp (24hrs), Avg:100 F (37.8 C), Min:99 F (37.2 C), Max:101.1 F (38.4 C)  Recent Labs  Lab 12/29/19 0455 12/30/19 0511 12/31/19 0438 12/31/19 1122 12/31/19 1330 01/01/20 0409 01/02/20 0457  WBC 13.1* 10.4  --  13.4*  --  11.6* 12.2*  CREATININE 0.60 0.60 0.62  --   --  0.55 0.66  LATICACIDVEN  --   --   --   --  1.8  --   --     Estimated Creatinine Clearance: 104.2 mL/min (by C-G formula based on SCr of 0.66 mg/dL).    No Known Allergies  Antimicrobials this admission: Cefepime 10/31 >>   Dose adjustments this admission: N/A  Microbiology results: 10/31 BCx: pending 10/25 UCx: insignificant growth  10/24 BCx: no growth 10/24 SARS-CoV-2: positive  Thank you for allowing pharmacy to be a part of this patient's care.  11/24 01/02/2020 7:53 PM

## 2020-01-02 NOTE — Plan of Care (Signed)
Pt remains on Vecuronium Drip per MD order, she continues to be on sedation with propofol and has a hydromorphone continuous infusion. RASS score is -4 with a goal of -4, BIS monitor values are between 40-43 with a goal of <60 , and 2-3 twitches present on PNS with a goal of 1-2. She remains stable with BP MAP >65, NSR in the 80s, O2 > 93%, and synchronous to ventilator. Tube feedings increased to a rate of 103ml/hr per verbal order and a recent ABG has been drawn at 1200.  Results for KYLLIE, PETTIJOHN (MRN [id number]) as of 01/02/2020 14:59  Ref. Range 01/02/2020 12:00  pH, Arterial Latest Ref Range: 7.35 - 7.45  7.33 (L)  pCO2 arterial Latest Ref Range: 32 - 48 mmHg 91 (HH)  pO2, Arterial Latest Ref Range: 83 - 108 mmHg 85  Acid-Base Excess Latest Ref Range: 0.0 - 2.0 mmol/L 18.0 (H)  Bicarbonate Latest Ref Range: 20.0 - 28.0 mmol/L 48.0 (H)  O2 Saturation Latest Units: % 95.7

## 2020-01-02 NOTE — Progress Notes (Signed)
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

## 2020-01-02 NOTE — Progress Notes (Signed)
Pt has elevated temp despite prn Tylenol administration. Verbal orders for Tylenol 650 mg once, a respiratory culture, and one set of blood cultures.

## 2020-01-02 NOTE — Progress Notes (Addendum)
CRITICAL CARE NOTE  CC  follow up respiratory failure  SUBJECTIVE Patient remains critically ill Intubated and sedated o2 being weaned down. Down to 70% Prognosis is guarded     SIGNIFICANT EVENTS Worsening hypercapnia/hypoxemia requiring paralytics   BP 119/71   Pulse 78   Temp 99.5 F (37.5 C) (Esophageal)   Resp (!) 30   Ht 5' 6"  (1.676 m)   Wt 121.3 kg   LMP 08/16/2014   SpO2 95%   BMI 43.16 kg/m    REVIEW OF SYSTEMS  PATIENT IS UNABLE TO PROVIDE COMPLETE REVIEW OF SYSTEMS DUE TO SEVERE CRITICAL ILLNESS /intubated and sedated  PHYSICAL EXAMINATION:  GENERAL:critically ill appearing, no resp distress HEAD: Normocephalic, atraumatic.  EYES: Pupils equal, round, reactive to light.  No scleral icterus.  MOUTH: Moist mucosal membrane. NECK: Supple. No thyromegaly. No nodules. No JVD.  PULMONARY: few rhonchi,  No wheezing CARDIOVASCULAR: S1 and S2. Regular rate and rhythm. No murmurs, rubs, or gallops.  GASTROINTESTINAL: Soft, nontender, -distended. No masses. Positive bowel sounds. No hepatosplenomegaly.  MUSCULOSKELETAL: No swelling, clubbing, or edema.  NEUROLOGIC: sedated/intubated/paralysed` SKIN:intact,warm,dry  INTAKE/OUTPUT  Intake/Output Summary (Last 24 hours) at 01/02/2020 1516 Last data filed at 01/02/2020 1355 Gross per 24 hour  Intake 5033.36 ml  Output 2225 ml  Net 2808.36 ml    LABS  CBC Recent Labs  Lab 12/31/19 1122 01/01/20 0409 01/02/20 0457  WBC 13.4* 11.6* 12.2*  HGB 10.8* 10.5* 10.4*  HCT 34.0* 33.1* 33.8*  PLT 336 314 310   Coag's No results for input(s): APTT, INR in the last 168 hours. BMET Recent Labs  Lab 12/31/19 0438 01/01/20 0409 01/02/20 0457  NA 148* 148* 146*  K 4.2 4.1 4.5  CL 101 102 99  CO2 37* 35* 40*  BUN 42* 39* 36*  CREATININE 0.62 0.55 0.66  GLUCOSE 114* 115* 109*   Electrolytes Recent Labs  Lab 12/31/19 0438 01/01/20 0409 01/02/20 0457  CALCIUM 8.9 8.3* 8.5*  MG 2.6* 2.5* 2.7*   PHOS 3.9 5.5* 5.6*   Sepsis Markers Recent Labs  Lab 12/19/2019 1604 12/13/2019 1804 12/27/19 0537 12/28/19 0503 12/31/19 1330  LATICACIDVEN 0.9  --   --   --  1.8  PROCALCITON  --  <0.10 <0.10 0.13  --    ABG Recent Labs  Lab 01/01/20 2352 01/02/20 0457 01/02/20 1200  PHART 7.27* 7.31* 7.33*  PCO2ART 110* 90* 91*  PO2ART 64* 112* 85   Liver Enzymes Recent Labs  Lab 12/27/19 0537 01/01/20 0409 01/02/20 0457  AST 110* 34 51*  ALT 62* 66* 78*  ALKPHOS 53 46 58  BILITOT 1.1 0.7 0.7  ALBUMIN 3.3* 2.5* 2.3*   Cardiac Enzymes No results for input(s): TROPONINI, PROBNP in the last 168 hours. Glucose Recent Labs  Lab 01/01/20 2042 01/02/20 0010 01/02/20 0409 01/02/20 0750 01/02/20 1057 01/02/20 1515  GLUCAP 171* 148* 100* 104* 116* 165*     Recent Results (from the past 240 hour(s))  Blood Culture (routine x 2)     Status: None   Collection Time: 12/19/2019  1:29 PM   Specimen: BLOOD  Result Value Ref Range Status   Specimen Description BLOOD LEFT ANTECUBITAL  Final   Special Requests   Final    BOTTLES DRAWN AEROBIC AND ANAEROBIC Blood Culture adequate volume   Culture   Final    NO GROWTH 5 DAYS Performed at [facility], [address] [place],  [address]    Report Status 12/31/2019 FINAL  Final  Respiratory Panel by RT PCR (Flu A&B, Covid) - Nasopharyngeal Swab     Status: Abnormal   Collection Time: 12/07/2019  1:30 PM   Specimen: Nasopharyngeal Swab  Result Value Ref Range Status   SARS Coronavirus 2 by RT PCR POSITIVE (A) NEGATIVE Final    Comment: RESULT CALLED TO, READ BACK BY AND VERIFIED WITH: KATE BUMGARNER ON 12/25/2019 AT 5003 TIK (NOTE) SARS-CoV-2 target nucleic acids are DETECTED.  SARS-CoV-2 RNA is generally detectable in upper respiratory specimens  during the acute phase of infection. Positive results are indicative of the presence of the identified virus, but do not rule out bacterial infection or co-infection with  other pathogens not detected by the test. Clinical correlation with patient history and other diagnostic information is necessary to determine patient infection status. The expected result is Negative.  Fact Sheet for Patients:  PinkCheek.be  Fact Sheet for Healthcare Providers: GravelBags.it  This test is not yet approved or cleared by the [place] FDA and  has been authorized for detection and/or diagnosis of SARS-CoV-2 by FDA under an Emergency Use Authorization (EUA).  This EUA will remain in effect (meaning this test c an be used) for the duration of  the COVID-19 declaration under Section 564(b)(1) of the Act, 21 U.S.C. section 360bbb-3(b)(1), unless the authorization is terminated or revoked sooner.      Influenza A by PCR NEGATIVE NEGATIVE Final   Influenza B by PCR NEGATIVE NEGATIVE Final    Comment: (NOTE) The Xpert Xpress SARS-CoV-2/FLU/RSV assay is intended as an aid in  the diagnosis of influenza from Nasopharyngeal swab specimens and  should not be used as a sole basis for treatment. Nasal washings and  aspirates are unacceptable for Xpert Xpress SARS-CoV-2/FLU/RSV  testing.  Fact Sheet for Patients: PinkCheek.be  Fact Sheet for Healthcare Providers: GravelBags.it  This test is not yet approved or cleared by the [place] FDA and  has been authorized for detection and/or diagnosis of SARS-CoV-2 by  FDA under an Emergency Use Authorization (EUA). This EUA will remain  in effect (meaning this test can be used) for the duration of the  Covid-19 declaration under Section 564(b)(1) of the Act, 21  U.S.C. section 360bbb-3(b)(1), unless the authorization is  terminated or revoked. Performed at [facility], [address] [place], [place] [address]   Blood Culture (routine x 2)     Status: None   Collection Time: 12/22/2019  1:34 PM    Specimen: BLOOD  Result Value Ref Range Status   Specimen Description BLOOD RIGHT ANTECUBITAL  Final   Special Requests   Final    BOTTLES DRAWN AEROBIC AND ANAEROBIC Blood Culture results may not be optimal due to an excessive volume of blood received in culture bottles   Culture   Final    NO GROWTH 5 DAYS Performed at [facility], [address] [place], [place] [address]    Report Status 12/31/2019 FINAL  Final  Urine culture     Status: Abnormal   Collection Time: 12/27/19  5:37 AM   Specimen: In/Out Cath Urine  Result Value Ref Range Status   Specimen Description   Final    IN/OUT CATH URINE Performed at [facility], [address], [place], [place] [address]    Special Requests   Final    NONE Performed at [facility], [address] [place], [place] [address]    Culture (A)  Final    <10,000 COLONIES/mL INSIGNIFICANT GROWTH Performed  at [facility], [place] [address], [place], [place] [address]    Report Status 12/28/2019 FINAL  Final    cxr 10/31 as per rad,   IMPRESSION: 1. No change from the previous day's exam. Stable well-positioned support apparatus. Persistent bilateral airspace lung opacities consistent with multifocal pneumonia.  Image reviewed by me > increased infilterates with pulm edema  MEDICATIONS   Current Facility-Administered Medications:  .  0.9 %  sodium chloride infusion, 250 mL, Intravenous, Continuous, Darel Hong D, NP, Last Rate: 10 mL/hr at 01/02/20 1217, Rate Verify at 01/02/20 1217 .  acetaminophen (TYLENOL) tablet 650 mg, 650 mg, Per Tube, Q6H PRN, Pernell Dupre, RPH, 650 mg at 01/01/20 2305 .  artificial tears (LACRILUBE) ophthalmic ointment 1 application, 1 application, Both Eyes, Q8H, Rust-Chester, Britton L, NP, 1 application at 46/65/99 1344 .  chlorhexidine gluconate (MEDLINE KIT) (PERIDEX) 0.12 % solution 15 mL, 15 mL, Mouth Rinse, BID, Tyler Pita, MD, 15 mL at 01/02/20  0753 .  Chlorhexidine Gluconate Cloth 2 % PADS 6 each, 6 each, Topical, Daily, Rosine Door, MD, 6 each at 01/02/20 1113 .  dexamethasone (DECADRON) injection 6 mg, 6 mg, Intravenous, Q24H, Benita Gutter, RPH, 6 mg at 01/02/20 1351 .  docusate (COLACE) 50 MG/5ML liquid 100 mg, 100 mg, Per Tube, BID PRN, Tyler Pita, MD .  enoxaparin (LOVENOX) injection 60 mg, 60 mg, Subcutaneous, Q24H, Benita Gutter, RPH, 60 mg at 01/01/20 2055 .  feeding supplement (PROSource TF) liquid 90 mL, 90 mL, Per Tube, QID, Tyler Pita, MD, 90 mL at 01/02/20 1343 .  feeding supplement (VITAL AF 1.2 CAL) liquid 1,000 mL, 1,000 mL, Per Tube, Q24H, Tyler Pita, MD, Last Rate: 20 mL/hr at 01/02/20 0400, Rate Verify at 01/02/20 0400 .  free water 200 mL, 200 mL, Per Tube, Q4H, Dallie Piles, RPH, 200 mL at 01/02/20 1217 .  HYDROmorphone (DILAUDID) 50 mg in sodium chloride 0.9 % 100 mL (0.5 mg/mL) infusion, 2 mg/hr, Intravenous, Continuous, Nelle Don, MD, Last Rate: 4 mL/hr at 01/02/20 1217, 2 mg/hr at 01/02/20 1217 .  insulin aspart (novoLOG) injection 0-20 Units, 0-20 Units, Subcutaneous, Q4H, Darel Hong D, NP, 3 Units at 01/02/20 0011 .  MEDLINE mouth rinse, 15 mL, Mouth Rinse, 10 times per day, Tyler Pita, MD, 15 mL at 01/02/20 1344 .  midazolam (VERSED) injection 2-4 mg, 2-4 mg, Intravenous, Q1H PRN, Rust-Chester, Britton L, NP .  multivitamin with minerals tablet 1 tablet, 1 tablet, Per Tube, Daily, Rosine Door, MD, 1 tablet at 01/02/20 1022 .  ondansetron (ZOFRAN) injection 4 mg, 4 mg, Intravenous, Q6H PRN, Tyler Pita, MD .  pantoprazole (PROTONIX) injection 40 mg, 40 mg, Intravenous, Q12H, Tyler Pita, MD, 40 mg at 01/02/20 1022 .  polyethylene glycol (MIRALAX / GLYCOLAX) packet 17 g, 17 g, Per Tube, Daily PRN, Tyler Pita, MD .  propofol (DIPRIVAN) 1000 MG/100ML infusion, 0-50 mcg/kg/min, Intravenous, Continuous, Darel Hong D, NP, Last Rate:  33.4 mL/hr at 01/02/20 1342, 45 mcg/kg/min at 01/02/20 1342 .  sodium chloride flush (NS) 0.9 % injection 10-40 mL, 10-40 mL, Intracatheter, Q12H, Tyler Pita, MD, 10 mL at 01/02/20 1029 .  sodium chloride flush (NS) 0.9 % injection 10-40 mL, 10-40 mL, Intracatheter, PRN, Tyler Pita, MD, 10 mL at 12/27/19 2114 .  vecuronium (NORCURON) 100 mg in sodium chloride 0.9 % 200 mL (0.5 mg/mL) infusion, 0-1.7 mcg/kg/min, Intravenous, Titrated, Rust-Chester, Huel Cote, NP, Last  Rate: 11.4 mL/hr at 01/10/20 1217, 0.8 mcg/kg/min at 2020-01-10 1217      Indwelling Urinary Catheter continued, requirement due to   Reason to continue Indwelling Urinary Catheter for strict Intake/Output monitoring for hemodynamic instability   Central Line continued, requirement due to   Reason to continue [employer] Monitoring of central venous pressure or other hemodynamic parameters   Ventilator continued, requirement due to, resp failure    Ventilator Sedation RASS 0 to -2     ASSESSMENT AND PLAN SYNOPSIS Acute respiratory failure with hypoxia/hypercapnia due to COVID-19 infection COVID-19 pneumonia  intubated 10/26, continues to be vent dependent At present PRVC 30, 437m, 70 %, peep 14 Overnight became hypercapnic with pH of 7.27 and PCO2 in 100s.  We increased her respiratory rate to 30 and tidal volume to 430 mL .  ABG on this  7.43/74/70   ?CXR appears worse. ? Volume overload, repeat cxr in am Wean o2/peep as tolerated IV dexamethasone, last dose 01/05/20 Supportive care Trend D-dimer and procalcitonin WBCslightly elevated at 12,2 Continue to monitor in ICU Proning if necessary Continue to trend inflammatory markers, 01/01/20  CRP 6.6, ferritin 754,fibrin derivatives 1404 Cont propofol/dilaudid( triglycerides  93 ,12024/11/08 If not better with diuresis consider CTA pulm  Pt on lovenox 653mdaily  ? Volume overload with net + 3.5 since admission, will rx with lasix 20 mg q12 x 4  doses with BP parameters. BNP in am  Hypokalemia resolved  Hypernatremia, increase water thru OG/NG tube, better  Hyperglycemia  Likely due to steroids ICU hyperglycemia protocol  Lactic acidosis Resolved with oxygen supplementation     Critical Care Time devoted to patient care services described in this note is 45  minutes.   Overall, patient is critically ill, prognosis is guarded.  Patient with Multiorgan failure and at high risk for cardiac arrest and death.   KhRosine DoorMD  1008-Nov-2021:16 PM [facility]

## 2020-01-03 ENCOUNTER — Inpatient Hospital Stay: Payer: BC Managed Care – PPO

## 2020-01-03 DIAGNOSIS — J9600 Acute respiratory failure, unspecified whether with hypoxia or hypercapnia: Secondary | ICD-10-CM | POA: Diagnosis not present

## 2020-01-03 DIAGNOSIS — U071 COVID-19: Secondary | ICD-10-CM | POA: Diagnosis not present

## 2020-01-03 LAB — BLOOD GAS, ARTERIAL
Acid-Base Excess: 18.3 mmol/L — ABNORMAL HIGH (ref 0.0–2.0)
Acid-Base Excess: 24.1 mmol/L — ABNORMAL HIGH (ref 0.0–2.0)
Bicarbonate: 45.2 mmol/L — ABNORMAL HIGH (ref 20.0–28.0)
Bicarbonate: 51.7 mmol/L — ABNORMAL HIGH (ref 20.0–28.0)
FIO2: 0.7
FIO2: 0.8
MECHVT: 400 mL
MECHVT: 430 mL
O2 Saturation: 94.6 %
O2 Saturation: 96.2 %
PEEP: 14 cmH2O
PEEP: 14 cmH2O
Patient temperature: 37
Patient temperature: 37
RATE: 30 resp/min
RATE: 30 resp/min
pCO2 arterial: 65 mmHg — ABNORMAL HIGH (ref 32.0–48.0)
pCO2 arterial: 71 mmHg (ref 32.0–48.0)
pH, Arterial: 7.45 (ref 7.350–7.450)
pH, Arterial: 7.47 — ABNORMAL HIGH (ref 7.350–7.450)
pO2, Arterial: 70 mmHg — ABNORMAL LOW (ref 83.0–108.0)
pO2, Arterial: 78 mmHg — ABNORMAL LOW (ref 83.0–108.0)

## 2020-01-03 LAB — COMPREHENSIVE METABOLIC PANEL
ALT: 57 U/L — ABNORMAL HIGH (ref 0–44)
AST: 28 U/L (ref 15–41)
Albumin: 2.2 g/dL — ABNORMAL LOW (ref 3.5–5.0)
Alkaline Phosphatase: 45 U/L (ref 38–126)
Anion gap: 9 (ref 5–15)
BUN: 36 mg/dL — ABNORMAL HIGH (ref 6–20)
CO2: 40 mmol/L — ABNORMAL HIGH (ref 22–32)
Calcium: 8.3 mg/dL — ABNORMAL LOW (ref 8.9–10.3)
Chloride: 94 mmol/L — ABNORMAL LOW (ref 98–111)
Creatinine, Ser: 0.59 mg/dL (ref 0.44–1.00)
GFR, Estimated: >60 mL/min (ref >60)
Glucose, Bld: 122 mg/dL — ABNORMAL HIGH (ref 70–99)
Potassium: 3.5 mmol/L (ref 3.5–5.1)
Sodium: 143 mmol/L (ref 135–145)
Total Bilirubin: 0.7 mg/dL (ref 0.3–1.2)
Total Protein: 6.6 g/dL (ref 6.5–8.1)

## 2020-01-03 LAB — PHOSPHORUS: Phosphorus: 2.9 mg/dL (ref 2.5–4.6)

## 2020-01-03 LAB — GLUCOSE, CAPILLARY
Glucose-Capillary: 109 mg/dL — ABNORMAL HIGH (ref 70–99)
Glucose-Capillary: 110 mg/dL — ABNORMAL HIGH (ref 70–99)
Glucose-Capillary: 118 mg/dL — ABNORMAL HIGH (ref 70–99)
Glucose-Capillary: 153 mg/dL — ABNORMAL HIGH (ref 70–99)
Glucose-Capillary: 157 mg/dL — ABNORMAL HIGH (ref 70–99)
Glucose-Capillary: 188 mg/dL — ABNORMAL HIGH (ref 70–99)

## 2020-01-03 LAB — CBC
HCT: 29.9 % — ABNORMAL LOW (ref 36.0–46.0)
Hemoglobin: 10 g/dL — ABNORMAL LOW (ref 12.0–15.0)
MCH: 32.2 pg (ref 26.0–34.0)
MCHC: 33.4 g/dL (ref 30.0–36.0)
MCV: 96.1 fL (ref 80.0–100.0)
Platelets: 304 10*3/uL (ref 150–400)
RBC: 3.11 MIL/uL — ABNORMAL LOW (ref 3.87–5.11)
RDW: 13 % (ref 11.5–15.5)
WBC: 12.3 10*3/uL — ABNORMAL HIGH (ref 4.0–10.5)
nRBC: 0.2 % (ref 0.0–0.2)

## 2020-01-03 LAB — BRAIN NATRIURETIC PEPTIDE: B Natriuretic Peptide: 266.1 pg/mL — ABNORMAL HIGH (ref 0.0–100.0)

## 2020-01-03 LAB — PROCALCITONIN: Procalcitonin: 0.47 ng/mL

## 2020-01-03 LAB — TRIGLYCERIDES: Triglycerides: 128 mg/dL (ref <150)

## 2020-01-03 LAB — FERRITIN: Ferritin: 683 ng/mL — ABNORMAL HIGH (ref 11–307)

## 2020-01-03 LAB — C-REACTIVE PROTEIN: CRP: 22.7 mg/dL — ABNORMAL HIGH (ref <1.0)

## 2020-01-03 LAB — MAGNESIUM: Magnesium: 2.3 mg/dL (ref 1.7–2.4)

## 2020-01-03 LAB — FIBRIN DERIVATIVES D-DIMER (ARMC ONLY): Fibrin derivatives D-dimer (ARMC): 1792.71 ng/mL (FEU) — ABNORMAL HIGH (ref 0.00–499.00)

## 2020-01-03 MED ORDER — POTASSIUM CHLORIDE 20 MEQ PO PACK
40.0000 meq | PACK | Freq: Once | ORAL | Status: AC
  Administered 2020-01-03: 40 meq
  Filled 2020-01-03: qty 2

## 2020-01-03 MED ORDER — VECURONIUM BROMIDE 10 MG IV SOLR
10.0000 mg | INTRAVENOUS | Status: DC | PRN
  Administered 2020-01-03 – 2020-01-06 (×13): 10 mg via INTRAVENOUS
  Administered 2020-01-06: 20 mg via INTRAVENOUS
  Administered 2020-01-06 (×2): 10 mg via INTRAVENOUS
  Administered 2020-01-06 (×2): 20 mg via INTRAVENOUS
  Administered 2020-01-06 – 2020-01-07 (×3): 10 mg via INTRAVENOUS
  Administered 2020-01-07: 20 mg via INTRAVENOUS
  Administered 2020-01-07 (×2): 10 mg via INTRAVENOUS
  Filled 2020-01-03 (×7): qty 10
  Filled 2020-01-03 (×2): qty 20
  Filled 2020-01-03: qty 10
  Filled 2020-01-03: qty 20
  Filled 2020-01-03 (×6): qty 10
  Filled 2020-01-03 (×2): qty 20
  Filled 2020-01-03 (×3): qty 10
  Filled 2020-01-03: qty 20

## 2020-01-03 NOTE — Progress Notes (Signed)
GOALS OF CARE DISCUSSION  The Clinical status was relayed to family in detail-Husband  Updated and notified of patients medical condition.  patient with increased WOB and using accessory muscles to breathe Explained to family course of therapy and the modalities     Patient with Progressive RESP failure .   Family understands the situation.     Family are satisfied with Plan of action and management. All questions answered  Additional CC time 32 mins   Nathania Waldman Santiago Glad, M.D.  [facility]  Medical Director [facility] Medical Director [facility] Cardio-Pulmonary Department

## 2020-01-03 NOTE — Progress Notes (Signed)
CRITICAL CARE NOTE SYNOPSIS 54 year old remote former smoker, with no chronic significant medical issues other than obesity and hypertension, who presents for evaluation of shortness of breath over the last week.  The patient states that approximately 6 weeks ago she developed a dry hacking cough which is not unusual for her during "allergy season and change of season".  However, approximately a week ago she noted fever and congestion and feeling significantly short of breath.  Despite this she went to have her annual medical exam done and had a flu vaccine given at that time.  She not vaccinated against COVID-19.  She has had some chest discomfort when coughing but not at any other time.  No orthopnea or paroxysmal nocturnal dyspnea until last night.  Noted that she could not lay flat to sleep.  He has not had any abdominal pain, no nausea, vomiting or bowel habit disruption.  She has had mild to moderate headache for the last week.  Cough became almost constant a day ago which prompted her to come to the emergency room today.  Aside from this she voices no other complaint.  She was evaluated at the emergency room where she was noted to be tachypneic and have a temperature of 101.6 F on arrival.  She also was noted to have oxygen saturations of 56% on room air.  She was placed on heated high flow O2 and nonrebreather mask and is now saturating 100%.  PCCM has been asked to admit the patient to stepdown/ICU.   10/24 severe hypoxia 10/25 intubation and MV support and severe hypoxia, ETT tube exchanged 10/26-10/31 severe hypoxia and severe ARDS 11/1 high risk for cardiac arrest and death   CC  follow up respiratory failure  SUBJECTIVE Patient remains critically ill Prognosis is guarded Severe hypoxia  Vent Mode: PRVC FiO2 (%):  [70 %-80 %] 80 % Set Rate:  [30 bmp] 30 bmp Vt Set:  [400 mL-430 mL] 400 mL PEEP:  [14 cmH20] 14 cmH20 Plateau Pressure:  [31 cmH20-33 cmH20] 31 cmH20  CBC     Component Value Date/Time   WBC 12.3 (H) 01/03/2020 0514   RBC 3.11 (L) 01/03/2020 0514   HGB 10.0 (L) 01/03/2020 0514   HCT 29.9 (L) 01/03/2020 0514   PLT 304 01/03/2020 0514   MCV 96.1 01/03/2020 0514   MCH 32.2 01/03/2020 0514   MCHC 33.4 01/03/2020 0514   RDW 13.0 01/03/2020 0514   LYMPHSABS 0.7 31-Dec-2019 1329   MONOABS 0.4 12/31/19 1329   EOSABS 0.0 12/31/2019 1329   BASOSABS 0.0 December 31, 2019 1329   CMP Latest Ref Rng & Units 01/02/2020 01/01/2020 12/31/2019  Glucose 70 - 99 mg/dL 009(F) 818(E) 993(Z)  BUN 6 - 20 mg/dL 16(R) 67(E) 93(Y)  Creatinine 0.44 - 1.00 mg/dL 1.01 7.51 0.25  Sodium 135 - 145 mmol/L 146(H) 148(H) 148(H)  Potassium 3.5 - 5.1 mmol/L 4.5 4.1 4.2  Chloride 98 - 111 mmol/L 99 102 101  CO2 22 - 32 mmol/L 40(H) 35(H) 37(H)  Calcium 8.9 - 10.3 mg/dL 8.5(I) 8.3(L) 8.9  Total Protein 6.5 - 8.1 g/dL 6.7 6.6 -  Total Bilirubin 0.3 - 1.2 mg/dL 0.7 0.7 -  Alkaline Phos 38 - 126 U/L 58 46 -  AST 15 - 41 U/L 51(H) 34 -  ALT 0 - 44 U/L 78(H) 66(H) -       BP 110/62   Pulse 87   Temp 99.3 F (37.4 C) (Esophageal)   Resp (!) 32   Ht 5\' 6"  (  1.676 m)   Wt 123.8 kg   LMP 08/16/2014   SpO2 92%   BMI 44.05 kg/m    I/O last 3 completed shifts: In: 5086.4 [I.V.:1944.9; NG/GT:2941.5; IV Piggyback:200] Out: 3950 [Urine:3750; Stool:200] No intake/output data recorded.  SpO2: 92 % O2 Flow Rate (L/min): 0 L/min (no flow data to report) FiO2 (%): 80 %  Estimated body mass index is 44.05 kg/m as calculated from the following:   Height as of this encounter: 5\' 6"  (1.676 m).   Weight as of this encounter: 123.8 kg.  SIGNIFICANT EVENTS   REVIEW OF SYSTEMS  PATIENT IS UNABLE TO PROVIDE COMPLETE REVIEW OF SYSTEMS DUE TO SEVERE CRITICAL ILLNESS      COVID-19 DISASTER DECLARATION:   FULL CONTACT PHYSICAL EXAMINATION WAS NOT POSSIBLE DUE TO TREATMENT OF COVID-19  AND CONSERVATION OF PERSONAL PROTECTIVE EQUIPMENT, LIMITED EXAM FINDINGS  INCLUDE-   PHYSICAL EXAMINATION:  GENERAL:critically ill appearing, +resp distress NEUROLOGIC: obtunded, GCS<8   Patient assessed or the symptoms described in the history of present illness.  In the context of the Global COVID-19 pandemic, which necessitated consideration that the patient might be at risk for infection with the SARS-CoV-2 virus that causes COVID-19, Institutional protocols and algorithms that pertain to the evaluation of patients at risk for COVID-19 are in a state of rapid change based on information released by regulatory bodies including the CDC and federal and state organizations. These policies and algorithms were followed during the patient's care while in hospital.    MEDICATIONS: I have reviewed all medications and confirmed regimen as documented   CULTURE RESULTS   Recent Results (from the past 240 hour(s))  Blood Culture (routine x 2)     Status: None   Collection Time: 01/12/2020  1:29 PM   Specimen: BLOOD  Result Value Ref Range Status   Specimen Description BLOOD LEFT ANTECUBITAL  Final   Special Requests   Final    BOTTLES DRAWN AEROBIC AND ANAEROBIC Blood Culture adequate volume   Culture   Final    NO GROWTH 5 DAYS Performed at [facility], [address] [place], [place] [address]    Report Status 12/31/2019 FINAL  Final  Respiratory Panel by RT PCR (Flu A&B, Covid) - Nasopharyngeal Swab     Status: Abnormal   Collection Time: 2020/01/12  1:30 PM   Specimen: Nasopharyngeal Swab  Result Value Ref Range Status   SARS Coronavirus 2 by RT PCR POSITIVE (A) NEGATIVE Final    Comment: RESULT CALLED TO, READ BACK BY AND VERIFIED WITH: KATE BUMGARNER ON January 12, 2020 AT 1613 TIK (NOTE) SARS-CoV-2 target nucleic acids are DETECTED.  SARS-CoV-2 RNA is generally detectable in upper respiratory specimens  during the acute phase of infection. Positive results are indicative of the presence of the identified virus, but do not rule out bacterial  infection or co-infection with other pathogens not detected by the test. Clinical correlation with patient history and other diagnostic information is necessary to determine patient infection status. The expected result is Negative.  Fact Sheet for Patients:  https://www.moore.com/  Fact Sheet for Healthcare Providers: https://www.young.biz/  This test is not yet approved or cleared by the [place] FDA and  has been authorized for detection and/or diagnosis of SARS-CoV-2 by FDA under an Emergency Use Authorization (EUA).  This EUA will remain in effect (meaning this test c an be used) for the duration of  the COVID-19 declaration under Section 564(b)(1) of the Act, 21 U.S.C. section 360bbb-3(b)(1), unless the authorization  is terminated or revoked sooner.      Influenza A by PCR NEGATIVE NEGATIVE Final   Influenza B by PCR NEGATIVE NEGATIVE Final    Comment: (NOTE) The Xpert Xpress SARS-CoV-2/FLU/RSV assay is intended as an aid in  the diagnosis of influenza from Nasopharyngeal swab specimens and  should not be used as a sole basis for treatment. Nasal washings and  aspirates are unacceptable for Xpert Xpress SARS-CoV-2/FLU/RSV  testing.  Fact Sheet for Patients: https://www.moore.com/  Fact Sheet for Healthcare Providers: https://www.young.biz/  This test is not yet approved or cleared by the [place] FDA and  has been authorized for detection and/or diagnosis of SARS-CoV-2 by  FDA under an Emergency Use Authorization (EUA). This EUA will remain  in effect (meaning this test can be used) for the duration of the  Covid-19 declaration under Section 564(b)(1) of the Act, 21  U.S.C. section 360bbb-3(b)(1), unless the authorization is  terminated or revoked. Performed at [facility], [address] [place], [place] [address]   Blood Culture (routine x 2)     Status: None    Collection Time: 01/24/20  1:34 PM   Specimen: BLOOD  Result Value Ref Range Status   Specimen Description BLOOD RIGHT ANTECUBITAL  Final   Special Requests   Final    BOTTLES DRAWN AEROBIC AND ANAEROBIC Blood Culture results may not be optimal due to an excessive volume of blood received in culture bottles   Culture   Final    NO GROWTH 5 DAYS Performed at [facility], [place], [address], [place], [place] [address]    Report Status 12/31/2019 FINAL  Final  Urine culture     Status: Abnormal   Collection Time: 12/27/19  5:37 AM   Specimen: In/Out Cath Urine  Result Value Ref Range Status   Specimen Description   Final    IN/OUT CATH URINE Performed at [facility], [address], [place], [place] [address]    Special Requests   Final    NONE Performed at [facility], [address], [place], [place] [address]    Culture (A)  Final    <10,000 COLONIES/mL INSIGNIFICANT GROWTH Performed at [facility], [address], [place], [place] [address]    Report Status 12/28/2019 FINAL  Final  Culture, blood (single) w Reflex to ID Panel     Status: None (Preliminary result)   Collection Time: 01/02/20  7:10 PM   Specimen: BLOOD  Result Value Ref Range Status   Specimen Description BLOOD BLOOD LEFT HAND  Final   Special Requests   Final    BOTTLES DRAWN AEROBIC AND ANAEROBIC Blood Culture adequate volume   Culture   Final    NO GROWTH < 12 HOURS Performed at [facility], [address], [place], [place] [address]    Report Status PENDING  Incomplete          Indwelling Urinary Catheter continued, requirement due to   Reason to continue Indwelling Urinary Catheter strict Intake/Output monitoring for hemodynamic instability   Central Line/ continued, requirement due to  Reason to continue [employer] Monitoring of central venous pressure or other hemodynamic parameters and poor IV access   Ventilator continued, requirement due to  severe respiratory failure   Ventilator Sedation RASS 0 to -2      ASSESSMENT AND PLAN SYNOPSIS  Acute hypoxemic respiratory failure due to COVID-19 pneumonia / ARDS Mechanical ventilation via ARDS protocol, target PRVC 6 cc/kg  Wean PEEP and FiO2 as able Goal plateau pressure less than 30, driving pressure less than 15 Paralytics if necessary for vent synchrony, gas exchange Cycle prone positioning if necessary for oxygenation Deep sedation per PAD protocol, goal RASS -4, currently fentanyl, midazolam Diuresis as blood pressure and renal function can tolerate, goal CVP 5-8.   diuresis as tolerated based on Kidney function VAP prevention order set Remdesivir IV STEROIDS    Severe ACUTE Hypoxic and Hypercapnic Respiratory Failure -continue Full MV support -continue Bronchodilator Therapy -Wean Fio2 and PEEP as tolerated -VAP/VENT bundle implementation    Morbid obesity, possible OSA.   Will certainly impact respiratory mechanics, ventilator weaning Suspect will need to consider additional PEEP    NEUROLOGY Acute toxic metabolic encephalopathy, need for sedation Goal RASS -2 to -3  CARDIAC ICU monitoring  GI GI PROPHYLAXIS as indicated   DIET-->TF's as tolerated Constipation protocol as indicated  ENDO - will use ICU hypoglycemic\Hyperglycemia protocol if indicated     ELECTROLYTES -follow labs as needed -replace as needed -pharmacy consultation and following   DVT/GI PRX ordered and assessed TRANSFUSIONS AS NEEDED MONITOR FSBS I Assessed the need for Labs I Assessed the need for Foley I Assessed the need for Central Venous Line Family Discussion when available I Assessed the need for Mobilization I made an Assessment of medications to be adjusted accordingly Safety Risk assessment completed   CASE DISCUSSED IN MULTIDISCIPLINARY ROUNDS WITH ICU TEAM  Critical Care Time devoted to patient care services described in this note is 78 minutes.    Overall, patient is critically ill, prognosis is guarded.  Patient with Multiorgan failure and at high risk for cardiac arrest and death.    Lucie Leather, M.D.  [facility]  Medical Director [facility] Medical Director [facility] Cardio-Pulmonary Department

## 2020-01-03 NOTE — Progress Notes (Signed)
Neuro:  Dilaudid @ 2 Propofol @ 15 Versed @ 9 Vec PRN x 3 Tolerating most cares well.  Resp: FiO2 requirements varied throughout the day, deep suctioning required for sats in the 80s with resolution, occasionally breathing over the vent becoming tachypneic-resolved w/ Vec PRN CV: TMAX 38.3, tylenol given-fever resolved; VSS, WWP GIGU: Foley, OG tube, & FMS all in place and patent. Tolerating well, no deviations.  Skin: sacral mepilex in placeL posterior hand hematoma, otherwise CDI Social: Husband updated over the phone x 3, all questions and concerns addressed.  Events: Required PRN Vec x 3 for ventilator dyssynchrony, otherwise no events or deviations.

## 2020-01-03 NOTE — Progress Notes (Signed)
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 On 10/31 she was started on Lasix 20 mg IV q12h  Goal of Therapy:  Electrolytes WNL  Plan:   Potassium at low end of normal range on new Lasix regimen: 40 mEq KCl per tube x 1  Hypernatremia: increased free water 10/30 to 200 mL per tube q4h: resolved  Re-check electrolytes in am  Lowella Bandy ,PharmD Clinical Pharmacist 01/03/2020 11:50 AM

## 2020-01-03 DEATH — deceased

## 2020-01-04 DIAGNOSIS — U071 COVID-19: Secondary | ICD-10-CM | POA: Diagnosis not present

## 2020-01-04 DIAGNOSIS — J80 Acute respiratory distress syndrome: Secondary | ICD-10-CM

## 2020-01-04 LAB — BASIC METABOLIC PANEL
Anion gap: 11 (ref 5–15)
BUN: 38 mg/dL — ABNORMAL HIGH (ref 6–20)
CO2: 37 mmol/L — ABNORMAL HIGH (ref 22–32)
Calcium: 8.2 mg/dL — ABNORMAL LOW (ref 8.9–10.3)
Chloride: 94 mmol/L — ABNORMAL LOW (ref 98–111)
Creatinine, Ser: 0.58 mg/dL (ref 0.44–1.00)
GFR, Estimated: >60 mL/min (ref >60)
Glucose, Bld: 122 mg/dL — ABNORMAL HIGH (ref 70–99)
Potassium: 3.5 mmol/L (ref 3.5–5.1)
Sodium: 142 mmol/L (ref 135–145)

## 2020-01-04 LAB — BLOOD GAS, ARTERIAL
Acid-Base Excess: 15.9 mmol/L — ABNORMAL HIGH (ref 0.0–2.0)
Bicarbonate: 41.9 mmol/L — ABNORMAL HIGH (ref 20.0–28.0)
FIO2: 1
MECHVT: 400 mL
O2 Saturation: 96 %
PEEP: 12 cmH2O
Patient temperature: 37
RATE: 30 resp/min
pCO2 arterial: 55 mmHg — ABNORMAL HIGH (ref 32.0–48.0)
pH, Arterial: 7.49 — ABNORMAL HIGH (ref 7.350–7.450)
pO2, Arterial: 75 mmHg — ABNORMAL LOW (ref 83.0–108.0)

## 2020-01-04 LAB — CBC
HCT: 30.7 % — ABNORMAL LOW (ref 36.0–46.0)
Hemoglobin: 9.8 g/dL — ABNORMAL LOW (ref 12.0–15.0)
MCH: 29.6 pg (ref 26.0–34.0)
MCHC: 31.9 g/dL (ref 30.0–36.0)
MCV: 92.7 fL (ref 80.0–100.0)
Platelets: 283 10*3/uL (ref 150–400)
RBC: 3.31 MIL/uL — ABNORMAL LOW (ref 3.87–5.11)
RDW: 12.5 % (ref 11.5–15.5)
WBC: 13.1 10*3/uL — ABNORMAL HIGH (ref 4.0–10.5)
nRBC: 0.2 % (ref 0.0–0.2)

## 2020-01-04 LAB — GLUCOSE, CAPILLARY
Glucose-Capillary: 138 mg/dL — ABNORMAL HIGH (ref 70–99)
Glucose-Capillary: 108 mg/dL — ABNORMAL HIGH (ref 70–99)
Glucose-Capillary: 106 mg/dL — ABNORMAL HIGH (ref 70–99)
Glucose-Capillary: 127 mg/dL — ABNORMAL HIGH (ref 70–99)
Glucose-Capillary: 172 mg/dL — ABNORMAL HIGH (ref 70–99)

## 2020-01-04 LAB — MRSA PCR SCREENING: MRSA by PCR: NEGATIVE

## 2020-01-04 LAB — PHOSPHORUS: Phosphorus: 3 mg/dL (ref 2.5–4.6)

## 2020-01-04 LAB — PROCALCITONIN: Procalcitonin: 0.33 ng/mL

## 2020-01-04 LAB — TRIGLYCERIDES: Triglycerides: 125 mg/dL (ref <150)

## 2020-01-04 LAB — MAGNESIUM: Magnesium: 2.4 mg/dL (ref 1.7–2.4)

## 2020-01-04 MED ORDER — POTASSIUM CHLORIDE 20 MEQ PO PACK
40.0000 meq | PACK | Freq: Once | ORAL | Status: AC
  Administered 2020-01-04: 40 meq
  Filled 2020-01-04: qty 2

## 2020-01-04 MED ORDER — PROSOURCE TF PO LIQD
90.0000 mL | Freq: Two times a day (BID) | ORAL | Status: DC
  Administered 2020-01-04 – 2020-01-11 (×13): 90 mL

## 2020-01-04 MED ORDER — CHLORHEXIDINE GLUCONATE 0.12 % MT SOLN
OROMUCOSAL | Status: AC
  Filled 2020-01-04: qty 15

## 2020-01-04 MED ORDER — VITAL 1.5 CAL PO LIQD
1000.0000 mL | ORAL | Status: DC
  Administered 2020-01-04 – 2020-01-10 (×7): 1000 mL

## 2020-01-04 NOTE — Plan of Care (Signed)
Pt on full sedation this shift, no WUA per Dr Belia Heman, She does not respond to pain, but does move her tongue at times during mouth care.  BIS monitoring also DCd per MD, No vec administered so far this shift.  Pt tolerated full bath and bed change w/ no issues.  FIO2 remains 100%.  Husband calls regularly for updates.  Verbalizes understanding of POC,

## 2020-01-04 NOTE — Progress Notes (Signed)
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 Patient is currently intubated, sedated, and on mechanical ventilation. Nutrition currently provided via tube feeds.   On 10/31 she was started on Lasix 20 mg IV q12h. 2040 mL UOP documented yesterday.   Goal of Therapy:  Electrolytes WNL  Plan:   Potassium at low end of normal range on new Lasix regimen: 40 mEq KCl per tube x 1  Hypernatremia: increased free water 10/30 to 200 mL per tube q4h (1.2L/day): resolved  Re-check electrolytes in AM  Tressie Ellis 01/04/2020 8:28 AM

## 2020-01-04 NOTE — Progress Notes (Signed)
Nutrition Follow-up  DOCUMENTATION CODES:   Morbid obesity  INTERVENTION:  Initiate new goal TF regimen of Vital 1.5 Cal at 55 mL/hr (1320 ml goal daily volume) + PROSource TF 90 mL BID per tube. Provides 2140 kcal, 133 grams of protein, 1003 mL H2O daily.  New goal TF regimen meets 100% RDIs for vitamins/minerals. Will discontinue MVI.  NUTRITION DIAGNOSIS:   Inadequate oral intake related to inability to eat as evidenced by NPO status.  Ongoing.  GOAL:   Patient will meet greater than or equal to 90% of their needs  Met with TF regimen.  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   10/25 intubated 10/26 ETT replaced due to blown cuff  Patient is currently intubated on ventilator support MV: 11.1 L/min Temp (24hrs), Avg:98.5 F (36.9 C), Min:98.2 F (36.8 C), Max:98.8 F (37.1 C)  Medications reviewed and include: Decadron 6 mg Q24hrs IV, free water 200 mL Q4hrs, Lasix 20 mg Q12hrs IV, Novolog 0-20 units Q4hrs, MVI daily per tube, Protonix, cefepime, Dilaudid gtt, Versed gtt.  Labs reviewed: CBG 106-153, Chloride 94, CO2 37, BUN 38.  I/O: 2040 ml UOP yesterday (0.7 mL/kg/hr)  Weight trend: 124.2 kg on 11/2; +5.4 kg from 10/31  Enteral Access: 18 Fr. OGT placed 10/25; tip in stomach per chest x-ray 10/26  TF regimen: Vital AF 1.2 F Cal at 30 mL/hr (rate changed by MD over the weekend) + PROSource TF 90 mL QID   Discussed with RN and on rounds. Patient tolerating tube feeds. Now off propofol gtt.  NUTRITION - FOCUSED PHYSICAL EXAM:    Most Recent Value  Orbital Region No depletion  Upper Arm Region No depletion  Thoracic and Lumbar Region No depletion  Buccal Region Unable to assess  Temple Region No depletion  Clavicle  Bone Region No depletion  Clavicle and Acromion Bone Region No depletion  Scapular Bone Region Unable to assess  Dorsal Hand No depletion  Patellar Region No depletion  Anterior Thigh Region No depletion  Posterior Calf Region No depletion  Edema (RD Assessment) Mild  Hair Reviewed  Eyes Unable to assess  Mouth Unable to assess  Skin Reviewed  Nails Reviewed     Diet Order:   Diet Order            Diet NPO time specified  Diet effective now                EDUCATION NEEDS:   No education needs have been identified at this time  Skin:  Skin Assessment: Reviewed RN Assessment  Last BM:  01/04/2020 - small type 7; pt with rectal tube  Height:   Ht Readings from Last 1 Encounters:  12/30/2019 5' 6" (1.676 m)   Weight:   Wt Readings from Last 1 Encounters:  01/04/20 124.2 kg   Ideal Body Weight:  59.1 kg  BMI:  Body mass index is 44.19 kg/m.  Estimated Nutritional Needs:   Kcal:  2182  Protein:  125-135 grams  Fluid:  >/= 2 L/day  Jacklynn Barnacle, MS, RD, LDN Pager number available on Amion

## 2020-01-04 NOTE — Progress Notes (Signed)
CRITICAL CARE NOTE  SYNOPSIS 54 year old remote former smoker, with no chronic significant medical issues other than obesity and hypertension, who presents for evaluation of shortness of breath over the last week. The patient states that approximately 6 weeks ago she developed a dry hacking cough which is not unusual for her during "allergy season and change of season". However, approximately a week ago she noted fever and congestion and feeling significantly short of breath. Despite this she went to have her annual medical exam done and had a flu vaccine given at that time. She not vaccinated against COVID-19. She has had some chest discomfort when coughing but not at any other time. No orthopnea or paroxysmal nocturnal dyspnea until last night. Noted that she could not lay flat to sleep. He has not had any abdominal pain, no nausea, vomiting or bowel habit disruption. She has had mild to moderate headache for the last week. Cough became almost constant a day ago which prompted her to come to the emergency room today. Aside from this she voices no other complaint.  She was evaluated at the emergency room where she was noted to be tachypneic and have a temperature of 101.6 F on arrival. She also was noted to have oxygen saturations of 56% on room air. She was placed on heated high flow O2 and nonrebreather mask and is now saturating 100%. PCCM has been asked to admit the patient to stepdown/ICU.   10/24 severe hypoxia 10/25 intubation and MV support and severe hypoxia, ETT tube exchanged 10/26-10/31 severe hypoxia and severe ARDS 11/1 high risk for cardiac arrest and death  CC  follow up respiratory failure  SUBJECTIVE Patient remains critically ill Prognosis is guarded   Vent Mode: PRVC FiO2 (%):  [70 %-90 %] 90 % Set Rate:  [30 bmp] 30 bmp Vt Set:  [400 mL] 400 mL PEEP:  [12 cmH20-14 cmH20] 12 cmH20 Plateau Pressure:  [25 cmH20-31 cmH20] 25 cmH20  CBC    Component  Value Date/Time   WBC 13.1 (H) 01/04/2020 0639   RBC 3.31 (L) 01/04/2020 0639   HGB 9.8 (L) 01/04/2020 0639   HCT 30.7 (L) 01/04/2020 0639   PLT 283 01/04/2020 0639   MCV 92.7 01/04/2020 0639   MCH 29.6 01/04/2020 0639   MCHC 31.9 01/04/2020 0639   RDW 12.5 01/04/2020 0639   LYMPHSABS 0.7 06-Jan-2020 1329   MONOABS 0.4 2020-01-06 1329   EOSABS 0.0 01-06-20 1329   BASOSABS 0.0 2020-01-06 1329   CMP Latest Ref Rng & Units 01/04/2020 01/03/2020 01/02/2020  Glucose 70 - 99 mg/dL 536(R) 443(X) 540(G)  BUN 6 - 20 mg/dL 86(P) 61(P) 50(D)  Creatinine 0.44 - 1.00 mg/dL 3.26 7.12 4.58  Sodium 135 - 145 mmol/L 142 143 146(H)  Potassium 3.5 - 5.1 mmol/L 3.5 3.5 4.5  Chloride 98 - 111 mmol/L 94(L) 94(L) 99  CO2 22 - 32 mmol/L 37(H) 40(H) 40(H)  Calcium 8.9 - 10.3 mg/dL 8.2(L) 8.3(L) 8.5(L)  Total Protein 6.5 - 8.1 g/dL - 6.6 6.7  Total Bilirubin 0.3 - 1.2 mg/dL - 0.7 0.7  Alkaline Phos 38 - 126 U/L - 45 58  AST 15 - 41 U/L - 28 51(H)  ALT 0 - 44 U/L - 57(H) 78(H)     BP 111/69 (BP Location: Left Arm)    Pulse 61    Temp 98.2 F (36.8 C) (Esophageal)    Resp (!) 30    Ht 5\' 6"  (1.676 m)    Wt 124.2 kg  LMP 08/16/2014    SpO2 92%    BMI 44.19 kg/m    I/O last 3 completed shifts: In: 5729.6 [I.V.:1422.4; NG/GT:3810; IV Piggyback:497.2] Out: 4965 [Urine:3965; Emesis/NG output:950; Stool:50] No intake/output data recorded.  SpO2: 92 % O2 Flow Rate (L/min): 0 L/min (no flow data to report) FiO2 (%): 90 %  Estimated body mass index is 44.19 kg/m as calculated from the following:   Height as of this encounter: 5\' 6"  (1.676 m).   Weight as of this encounter: 124.2 kg.  SIGNIFICANT EVENTS   REVIEW OF SYSTEMS  PATIENT IS UNABLE TO PROVIDE COMPLETE REVIEW OF SYSTEMS DUE TO SEVERE CRITICAL ILLNESS      COVID-19 DISASTER DECLARATION:   FULL CONTACT PHYSICAL EXAMINATION WAS NOT POSSIBLE DUE TO TREATMENT OF COVID-19  AND CONSERVATION OF PERSONAL PROTECTIVE EQUIPMENT, LIMITED EXAM  FINDINGS INCLUDE-   PHYSICAL EXAMINATION:  GENERAL:critically ill appearing, +resp distress NEUROLOGIC: obtunded, GCS<8   Patient assessed or the symptoms described in the history of present illness.  In the context of the Global COVID-19 pandemic, which necessitated consideration that the patient might be at risk for infection with the SARS-CoV-2 virus that causes COVID-19, Institutional protocols and algorithms that pertain to the evaluation of patients at risk for COVID-19 are in a state of rapid change based on information released by regulatory bodies including the CDC and federal and state organizations. These policies and algorithms were followed during the patient's care while in hospital.    MEDICATIONS: I have reviewed all medications and confirmed regimen as documented   CULTURE RESULTS   Recent Results (from the past 240 hour(s))  Blood Culture (routine x 2)     Status: None   Collection Time: 12/17/2019  1:29 PM   Specimen: BLOOD  Result Value Ref Range Status   Specimen Description BLOOD LEFT ANTECUBITAL  Final   Special Requests   Final    BOTTLES DRAWN AEROBIC AND ANAEROBIC Blood Culture adequate volume   Culture   Final    NO GROWTH 5 DAYS Performed at [facility], [address] [place], [place] [address]    Report Status 12/31/2019 FINAL  Final  Respiratory Panel by RT PCR (Flu A&B, Covid) - Nasopharyngeal Swab     Status: Abnormal   Collection Time: 12/27/2019  1:30 PM   Specimen: Nasopharyngeal Swab  Result Value Ref Range Status   SARS Coronavirus 2 by RT PCR POSITIVE (A) NEGATIVE Final    Comment: RESULT CALLED TO, READ BACK BY AND VERIFIED WITH: KATE BUMGARNER ON 12/24/2019 AT 1613 TIK (NOTE) SARS-CoV-2 target nucleic acids are DETECTED.  SARS-CoV-2 RNA is generally detectable in upper respiratory specimens  during the acute phase of infection. Positive results are indicative of the presence of the identified virus, but do not rule  out bacterial infection or co-infection with other pathogens not detected by the test. Clinical correlation with patient history and other diagnostic information is necessary to determine patient infection status. The expected result is Negative.  Fact Sheet for Patients:  https://www.moore.com/  Fact Sheet for Healthcare Providers: https://www.young.biz/  This test is not yet approved or cleared by the [place] FDA and  has been authorized for detection and/or diagnosis of SARS-CoV-2 by FDA under an Emergency Use Authorization (EUA).  This EUA will remain in effect (meaning this test c an be used) for the duration of  the COVID-19 declaration under Section 564(b)(1) of the Act, 21 U.S.C. section 360bbb-3(b)(1), unless the authorization is terminated or revoked sooner.  Influenza A by PCR NEGATIVE NEGATIVE Final   Influenza B by PCR NEGATIVE NEGATIVE Final    Comment: (NOTE) The Xpert Xpress SARS-CoV-2/FLU/RSV assay is intended as an aid in  the diagnosis of influenza from Nasopharyngeal swab specimens and  should not be used as a sole basis for treatment. Nasal washings and  aspirates are unacceptable for Xpert Xpress SARS-CoV-2/FLU/RSV  testing.  Fact Sheet for Patients: https://www.moore.com/  Fact Sheet for Healthcare Providers: https://www.young.biz/  This test is not yet approved or cleared by the [place] FDA and  has been authorized for detection and/or diagnosis of SARS-CoV-2 by  FDA under an Emergency Use Authorization (EUA). This EUA will remain  in effect (meaning this test can be used) for the duration of the  Covid-19 declaration under Section 564(b)(1) of the Act, 21  U.S.C. section 360bbb-3(b)(1), unless the authorization is  terminated or revoked. Performed at [facility], [address] [place], [place] [address]   Blood Culture (routine x 2)      Status: None   Collection Time: 12/30/2019  1:34 PM   Specimen: BLOOD  Result Value Ref Range Status   Specimen Description BLOOD RIGHT ANTECUBITAL  Final   Special Requests   Final    BOTTLES DRAWN AEROBIC AND ANAEROBIC Blood Culture results may not be optimal due to an excessive volume of blood received in culture bottles   Culture   Final    NO GROWTH 5 DAYS Performed at [facility], [address], [place], [place] [address]    Report Status 12/31/2019 FINAL  Final  Urine culture     Status: Abnormal   Collection Time: 12/27/19  5:37 AM   Specimen: In/Out Cath Urine  Result Value Ref Range Status   Specimen Description   Final    IN/OUT CATH URINE Performed at [facility], [address] [place], [place] [address]    Special Requests   Final    NONE Performed at [facility], [address], [place], [place] [address]    Culture (A)  Final    <10,000 COLONIES/mL INSIGNIFICANT GROWTH Performed at [facility], [address], [place], [place] [address]    Report Status 12/28/2019 FINAL  Final  Culture, blood (single) w Reflex to ID Panel     Status: None (Preliminary result)   Collection Time: 01/02/20  7:10 PM   Specimen: BLOOD  Result Value Ref Range Status   Specimen Description BLOOD BLOOD LEFT HAND  Final   Special Requests   Final    BOTTLES DRAWN AEROBIC AND ANAEROBIC Blood Culture adequate volume   Culture   Final    NO GROWTH 2 DAYS Performed at [facility], [address], [place], [place] [address]    Report Status PENDING  Incomplete  Culture, respiratory (non-expectorated)     Status: None (Preliminary result)   Collection Time: 01/03/20  9:00 AM   Specimen: Tracheal Aspirate; Respiratory  Result Value Ref Range Status   Specimen Description   Final    TRACHEAL ASPIRATE Performed at [facility], [address] [place], [place] [address]    Special Requests   Final    NONE Performed at  [facility], [address] [place], [place] [address]    Gram Stain   Final    MODERATE WBC PRESENT, PREDOMINANTLY PMN FEW GRAM POSITIVE COCCI Performed at [facility], [address] [place], [place] [address]  Culture PENDING  Incomplete   Report Status PENDING  Incomplete           Indwelling Urinary Catheter continued, requirement due to   Reason to continue Indwelling Urinary Catheter strict Intake/Output monitoring for hemodynamic instability   Central Line/ continued, requirement due to  Reason to continue [employer] Monitoring of central venous pressure or other hemodynamic parameters and poor IV access   Ventilator continued, requirement due to severe respiratory failure   Ventilator Sedation RASS 0 to -2      ASSESSMENT AND PLAN SYNOPSIS  Acute hypoxemic respiratory failure due to COVID-19 pneumonia / ARDS Mechanical ventilation via ARDS protocol, target PRVC 6 cc/kg Wean PEEP and FiO2 as able Goal plateau pressure less than 30, driving pressure less than 15 Paralytics if necessary for vent synchrony, gas exchange Cycle prone positioning if necessary for oxygenation Diuresis as blood pressure and renal function can tolerate, goal CVP 5-8.   diuresis as tolerated based on Kidney function VAP prevention order set Remdesivir  IV STEROIDS    Severe ACUTE Hypoxic and Hypercapnic Respiratory Failure -continue Full MV support -continue Bronchodilator Therapy -Wean Fio2 and PEEP as tolerated -VAP/VENT bundle implementation  Morbid obesity, possible OSA.   Will certainly impact respiratory mechanics, ventilator weaning Suspect will need to consider additional PEEP   NEUROLOGY Acute toxic metabolic encephalopathy, need for sedation Goal RASS -2 to -3  CARDIAC ICU monitoring  GI GI PROPHYLAXIS as indicated   DIET-->TF's as tolerated Constipation protocol as indicated  ENDO - will use ICU hypoglycemic\Hyperglycemia protocol  if indicated     ELECTROLYTES -follow labs as needed -replace as needed -pharmacy consultation and following   DVT/GI PRX ordered and assessed TRANSFUSIONS AS NEEDED MONITOR FSBS I Assessed the need for Labs I Assessed the need for Foley I Assessed the need for Central Venous Line Family Discussion when available I Assessed the need for Mobilization I made an Assessment of medications to be adjusted accordingly Safety Risk assessment completed   CASE DISCUSSED IN MULTIDISCIPLINARY ROUNDS WITH ICU TEAM  Critical Care Time devoted to patient care services described in this note is 43 minutes.   Overall, patient is critically ill, prognosis is guarded.  Patient with Multiorgan failure and at high risk for cardiac arrest and death.    Lucie Leather, M.D.  [facility]  Medical Director [facility] Medical Director [facility] Cardio-Pulmonary Department

## 2020-01-05 ENCOUNTER — Inpatient Hospital Stay: Payer: BC Managed Care – PPO

## 2020-01-05 ENCOUNTER — Inpatient Hospital Stay: Admit: 2020-01-05 | Payer: BC Managed Care – PPO

## 2020-01-05 DIAGNOSIS — J80 Acute respiratory distress syndrome: Secondary | ICD-10-CM | POA: Diagnosis not present

## 2020-01-05 DIAGNOSIS — U071 COVID-19: Secondary | ICD-10-CM | POA: Diagnosis not present

## 2020-01-05 DIAGNOSIS — J9600 Acute respiratory failure, unspecified whether with hypoxia or hypercapnia: Secondary | ICD-10-CM | POA: Diagnosis not present

## 2020-01-05 LAB — BASIC METABOLIC PANEL
Anion gap: 9 (ref 5–15)
BUN: 43 mg/dL — ABNORMAL HIGH (ref 6–20)
CO2: 36 mmol/L — ABNORMAL HIGH (ref 22–32)
Calcium: 8.1 mg/dL — ABNORMAL LOW (ref 8.9–10.3)
Chloride: 99 mmol/L (ref 98–111)
Creatinine, Ser: 0.62 mg/dL (ref 0.44–1.00)
GFR, Estimated: >60 mL/min (ref >60)
Glucose, Bld: 98 mg/dL (ref 70–99)
Potassium: 3.7 mmol/L (ref 3.5–5.1)
Sodium: 144 mmol/L (ref 135–145)

## 2020-01-05 LAB — BLOOD GAS, ARTERIAL
Acid-Base Excess: 13.4 mmol/L — ABNORMAL HIGH (ref 0.0–2.0)
Bicarbonate: 40.6 mmol/L — ABNORMAL HIGH (ref 20.0–28.0)
FIO2: 1
MECHVT: 400 mL
O2 Saturation: 97.5 %
PEEP: 16 cmH2O
Patient temperature: 37
RATE: 30 resp/min
pCO2 arterial: 64 mmHg — ABNORMAL HIGH (ref 32.0–48.0)
pH, Arterial: 7.41 (ref 7.350–7.450)
pO2, Arterial: 96 mmHg (ref 83.0–108.0)

## 2020-01-05 LAB — CBC WITH DIFFERENTIAL/PLATELET
Abs Immature Granulocytes: 0.83 10*3/uL — ABNORMAL HIGH (ref 0.00–0.07)
Basophils Absolute: 0.1 10*3/uL (ref 0.0–0.1)
Basophils Relative: 0 %
Eosinophils Absolute: 0.1 10*3/uL (ref 0.0–0.5)
Eosinophils Relative: 0 %
HCT: 31.6 % — ABNORMAL LOW (ref 36.0–46.0)
Hemoglobin: 10.1 g/dL — ABNORMAL LOW (ref 12.0–15.0)
Immature Granulocytes: 5 %
Lymphocytes Relative: 6 %
Lymphs Abs: 0.9 10*3/uL (ref 0.7–4.0)
MCH: 30.1 pg (ref 26.0–34.0)
MCHC: 32 g/dL (ref 30.0–36.0)
MCV: 94 fL (ref 80.0–100.0)
Monocytes Absolute: 1.5 10*3/uL — ABNORMAL HIGH (ref 0.1–1.0)
Monocytes Relative: 9 %
Neutro Abs: 12.3 10*3/uL — ABNORMAL HIGH (ref 1.7–7.7)
Neutrophils Relative %: 80 %
Platelets: 295 10*3/uL (ref 150–400)
RBC: 3.36 MIL/uL — ABNORMAL LOW (ref 3.87–5.11)
RDW: 12.6 % (ref 11.5–15.5)
WBC: 15.6 10*3/uL — ABNORMAL HIGH (ref 4.0–10.5)
nRBC: 0 % (ref 0.0–0.2)

## 2020-01-05 LAB — MAGNESIUM: Magnesium: 2.6 mg/dL — ABNORMAL HIGH (ref 1.7–2.4)

## 2020-01-05 LAB — GLUCOSE, CAPILLARY
Glucose-Capillary: 114 mg/dL — ABNORMAL HIGH (ref 70–99)
Glucose-Capillary: 124 mg/dL — ABNORMAL HIGH (ref 70–99)
Glucose-Capillary: 145 mg/dL — ABNORMAL HIGH (ref 70–99)
Glucose-Capillary: 161 mg/dL — ABNORMAL HIGH (ref 70–99)
Glucose-Capillary: 163 mg/dL — ABNORMAL HIGH (ref 70–99)
Glucose-Capillary: 165 mg/dL — ABNORMAL HIGH (ref 70–99)
Glucose-Capillary: 88 mg/dL (ref 70–99)
Glucose-Capillary: 94 mg/dL (ref 70–99)

## 2020-01-05 LAB — HEPARIN LEVEL (UNFRACTIONATED)
Heparin Unfractionated: 0.46 IU/mL (ref 0.30–0.70)
Heparin Unfractionated: 0.24 IU/mL — ABNORMAL LOW (ref 0.30–0.70)

## 2020-01-05 LAB — PHOSPHORUS: Phosphorus: 4.9 mg/dL — ABNORMAL HIGH (ref 2.5–4.6)

## 2020-01-05 LAB — TRIGLYCERIDES: Triglycerides: 87 mg/dL (ref <150)

## 2020-01-05 MED ORDER — HEPARIN (PORCINE) 25000 UT/250ML-% IV SOLN
1750.0000 [IU]/h | INTRAVENOUS | Status: DC
  Administered 2020-01-05: 1750 [IU]/h via INTRAVENOUS
  Administered 2020-01-05: 1600 [IU]/h via INTRAVENOUS
  Administered 2020-01-06 – 2020-01-14 (×14): 1750 [IU]/h via INTRAVENOUS
  Filled 2020-01-05 (×16): qty 250

## 2020-01-05 MED ORDER — HEPARIN BOLUS VIA INFUSION
1000.0000 [IU] | Freq: Once | INTRAVENOUS | Status: AC
  Administered 2020-01-05: 1000 [IU] via INTRAVENOUS
  Filled 2020-01-05: qty 1000

## 2020-01-05 MED ORDER — METHYLPREDNISOLONE SODIUM SUCC 40 MG IJ SOLR
20.0000 mg | Freq: Two times a day (BID) | INTRAMUSCULAR | Status: DC
  Administered 2020-01-05 – 2020-01-19 (×29): 20 mg via INTRAVENOUS
  Filled 2020-01-05 (×29): qty 1

## 2020-01-05 MED ORDER — ACETAZOLAMIDE SODIUM 500 MG IJ SOLR
500.0000 mg | Freq: Once | INTRAMUSCULAR | Status: AC
  Administered 2020-01-05: 500 mg via INTRAVENOUS
  Filled 2020-01-05: qty 500

## 2020-01-05 NOTE — Progress Notes (Addendum)
00:00 Patient became suddenly hypoxic with SpO2 in the 80's.  DDx: worsening ARDS, mucus plug, PE vecuronium PRN given, patient bagged & lavaged- no mucus plug, PEEP increased to 14 without effect. PEEP increased to 16, with minimal effect. 01:30 patient prone with PEEP of 16 & FiO2 100%.  P: -Heparin drip initiated for possible PE -Echocardiogram ordered  -BLE [place] ordered to r/o DVT & PE -Acetazolamide 500 mg ordered x 1 for additional diuresis, patient is still +4 L   Cheryll Cockayne Rust-Chester, AGACNP-BC [facility]  [phone] / [phone]  Please see Amion for pager details.

## 2020-01-05 NOTE — Progress Notes (Signed)
Patient placed supine at 16:30.  Patient still requiring 100% Fio2- patient's husband updated.

## 2020-01-05 NOTE — Progress Notes (Signed)
GOALS OF CARE DISCUSSION  The Clinical status was relayed to family in detail-Husband.  Updated and notified of patients medical condition.  patient with increased WOB and using accessory muscles to breathe Explained to family course of therapy and the modalities     Patient with Progressive RESP FAILURE failure with very likely low chance of meaningful recovery despite all aggressive and optimal medical therapy.   Family understands the situation.   Family are satisfied with Plan of action and management. All questions answered  Additional CC time 32 mins   Robbie Nangle Santiago Glad, M.D.  [facility]  Medical Director [facility] Medical Director [facility] Cardio-Pulmonary Department

## 2020-01-05 NOTE — Progress Notes (Signed)
CRITICAL CARE NOTE  SYNOPSIS 54 year old remote former smoker, with no chronic significant medical issues other than obesity and hypertension, who presents for evaluation of shortness of breath over the last week. The patient states that approximately 6 weeks ago she developed a dry hacking cough which is not unusual for her during "allergy season and change of season". However, approximately a week ago she noted fever and congestion and feeling significantly short of breath. Despite this she went to have her annual medical exam done and had a flu vaccine given at that time. She not vaccinated against COVID-19. She has had some chest discomfort when coughing but not at any other time. No orthopnea or paroxysmal nocturnal dyspnea until last night. Noted that she could not lay flat to sleep. He has not had any abdominal pain, no nausea, vomiting or bowel habit disruption. She has had mild to moderate headache for the last week. Cough became almost constant a day ago which prompted her to come to the emergency room today. Aside from this she voices no other complaint.  She was evaluated at the emergency room where she was noted to be tachypneic and have a temperature of 101.6 F on arrival. She also was noted to have oxygen saturations of 56% on room air. She was placed on heated high flow O2 and nonrebreather mask and is now saturating 100%. PCCM has been asked to admit the patient to stepdown/ICU.   10/24 severe hypoxia 10/25 intubation and MV support and severe hypoxia, ETT tube exchanged 10/26-10/31 severe hypoxia and severe ARDS 11/1 high risk for cardiac arrest and death  CC  Follow up ARDS Severe Hypoxia   SUBJECTIVE Remains critically ill Prognosis is very poor PRONED POSITION CXR PENDING   Vent Mode: PRVC FiO2 (%):  [100 %] 100 % Set Rate:  [26 bmp-30 bmp] 30 bmp Vt Set:  [400 mL] 400 mL PEEP:  [12 cmH20-16 cmH20] 16 cmH20 Plateau Pressure:  [22 cmH20-30 cmH20] 30  cmH20  CBC    Component Value Date/Time   WBC 15.6 (H) 01/05/2020 0450   RBC 3.36 (L) 01/05/2020 0450   HGB 10.1 (L) 01/05/2020 0450   HCT 31.6 (L) 01/05/2020 0450   PLT 295 01/05/2020 0450   MCV 94.0 01/05/2020 0450   MCH 30.1 01/05/2020 0450   MCHC 32.0 01/05/2020 0450   RDW 12.6 01/05/2020 0450   LYMPHSABS 0.9 01/05/2020 0450   MONOABS 1.5 (H) 01/05/2020 0450   EOSABS 0.1 01/05/2020 0450   BASOSABS 0.1 01/05/2020 0450   CMP Latest Ref Rng & Units 01/05/2020 01/04/2020 01/03/2020  Glucose 70 - 99 mg/dL 98 932(T) 557(D)  BUN 6 - 20 mg/dL 22(G) 25(K) 27(C)  Creatinine 0.44 - 1.00 mg/dL 6.23 7.62 8.31  Sodium 135 - 145 mmol/L 144 142 143  Potassium 3.5 - 5.1 mmol/L 3.7 3.5 3.5  Chloride 98 - 111 mmol/L 99 94(L) 94(L)  CO2 22 - 32 mmol/L 36(H) 37(H) 40(H)  Calcium 8.9 - 10.3 mg/dL 8.1(L) 8.2(L) 8.3(L)  Total Protein 6.5 - 8.1 g/dL - - 6.6  Total Bilirubin 0.3 - 1.2 mg/dL - - 0.7  Alkaline Phos 38 - 126 U/L - - 45  AST 15 - 41 U/L - - 28  ALT 0 - 44 U/L - - 57(H)     BP (!) 98/57   Pulse 72   Temp (!) 97.2 F (36.2 C) (Esophageal)   Resp 15   Ht 5\' 6"  (1.676 m)   Wt 119.6 kg  LMP 08/16/2014   SpO2 95%   BMI 42.56 kg/m    I/O last 3 completed shifts: In: 5057.1 [P.O.:240; I.V.:724.9; GN/OI:3704.8; IV Piggyback:407.7] Out: 8891 [Urine:2935; Emesis/NG output:950] No intake/output data recorded.  SpO2: 95 % O2 Flow Rate (L/min): 0 L/min (no flow data to report) FiO2 (%): 100 %  Estimated body mass index is 42.56 kg/m as calculated from the following:   Height as of this encounter: 5\' 6"  (1.676 m).   Weight as of this encounter: 119.6 kg.  SIGNIFICANT EVENTS  REVIEW OF SYSTEMS  PATIENT IS UNABLE TO PROVIDE COMPLETE REVIEW OF SYSTEM S DUE TO SEVERE CRITICAL ILLNESS AND ENCEPHALOPATHY     COVID-19 DISASTER DECLARATION:   FULL CONTACT PHYSICAL EXAMINATION WAS NOT POSSIBLE DUE TO TREATMENT OF COVID-19  AND CONSERVATION OF PERSONAL PROTECTIVE EQUIPMENT,  LIMITED EXAM FINDINGS INCLUDE-  PHYSICAL EXAMINATION:  GENERAL:critically ill appearing, +resp distress PULMONARY: +rhonchi, +wheezing CARDIOVASCULAR: S1 and S2. Regular rate and rhythm. No murmurs, rubs, or gallops.  GASTROINTESTINAL: Soft, nontender, -distended. Positive bowel sounds.  MUSCULOSKELETAL: No swelling, clubbing, or edema.  NEUROLOGIC: obtunded SKIN:intact,warm,dry    Patient assessed or the symptoms described in the history of present illness.  In the context of the Global COVID-19 pandemic, which necessitated consideration that the patient might be at risk for infection with the SARS-CoV-2 virus that causes COVID-19, Institutional protocols and algorithms that pertain to the evaluation of patients at risk for COVID-19 are in a state of rapid change based on information released by regulatory bodies including the CDC and federal and state organizations. These policies and algorithms were followed during the patient's care while in hospital.    MEDICATIONS: I have reviewed all medications and confirmed regimen as documented   CULTURE RESULTS   Recent Results (from the past 240 hour(s))  Blood Culture (routine x 2)     Status: None   Collection Time: 2020/01/24  1:29 PM   Specimen: BLOOD  Result Value Ref Range Status   Specimen Description BLOOD LEFT ANTECUBITAL  Final   Special Requests   Final    BOTTLES DRAWN AEROBIC AND ANAEROBIC Blood Culture adequate volume   Culture   Final    NO GROWTH 5 DAYS Performed at [facility], [address] [place], [place]    Report Status 12/31/2019 FINAL  Final  Respiratory Panel by RT PCR (Flu A&B, Covid) - Nasopharyngeal Swab     Status: Abnormal   Collection Time: Jan 24, 2020  1:30 PM   Specimen: Nasopharyngeal Swab  Result Value Ref Range Status   SARS Coronavirus 2 by RT PCR POSITIVE (A) NEGATIVE Final    Comment: RESULT CALLED TO, READ BACK BY AND VERIFIED WITH: KATE BUMGARNER ON 01/24/2020 AT 1613  TIK (NOTE) SARS-CoV-2 target nucleic acids are DETECTED.  SARS-CoV-2 RNA is generally detectable in upper respiratory specimens  during the acute phase of infection. Positive results are indicative of the presence of the identified virus, but do not rule out bacterial infection or co-infection with other pathogens not detected by the test. Clinical correlation with patient history and other diagnostic information is necessary to determine patient infection status. The expected result is Negative.  Fact Sheet for Patients:  12/28/2019  Fact Sheet for Healthcare Providers: https://www.moore.com/  This test is not yet approved or cleared by the https://www.young.biz/ FDA and  has been authorized for detection and/or diagnosis of SARS-CoV-2 by FDA under an Emergency Use Authorization (EUA).  This EUA will remain in effect (meaning this  test c an be used) for the duration of  the COVID-19 declaration under Section 564(b)(1) of the Act, 21 U.S.C. section 360bbb-3(b)(1), unless the authorization is terminated or revoked sooner.      Influenza A by PCR NEGATIVE NEGATIVE Final   Influenza B by PCR NEGATIVE NEGATIVE Final    Comment: (NOTE) The Xpert Xpress SARS-CoV-2/FLU/RSV assay is intended as an aid in  the diagnosis of influenza from Nasopharyngeal swab specimens and  should not be used as a sole basis for treatment. Nasal washings and  aspirates are unacceptable for Xpert Xpress SARS-CoV-2/FLU/RSV  testing.  Fact Sheet for Patients: https://www.moore.com/  Fact Sheet for Healthcare Providers: https://www.young.biz/  This test is not yet approved or cleared by the [place] FDA and  has been authorized for detection and/or diagnosis of SARS-CoV-2 by  FDA under an Emergency Use Authorization (EUA). This EUA will remain  in effect (meaning this test can be used) for the duration of the  Covid-19  declaration under Section 564(b)(1) of the Act, 21  U.S.C. section 360bbb-3(b)(1), unless the authorization is  terminated or revoked. Performed at [facility], [address] [place], [place] [address]   Blood Culture (routine x 2)     Status: None   Collection Time: January 01, 2020  1:34 PM   Specimen: BLOOD  Result Value Ref Range Status   Specimen Description BLOOD RIGHT ANTECUBITAL  Final   Special Requests   Final    BOTTLES DRAWN AEROBIC AND ANAEROBIC Blood Culture results may not be optimal due to an excessive volume of blood received in culture bottles   Culture   Final    NO GROWTH 5 DAYS Performed at [facility], [address] [place], [place] [address]    Report Status 12/31/2019 FINAL  Final  Urine culture     Status: Abnormal   Collection Time: 12/27/19  5:37 AM   Specimen: In/Out Cath Urine  Result Value Ref Range Status   Specimen Description   Final    IN/OUT CATH URINE Performed at [facility], [address] [place], [place] [address]    Special Requests   Final    NONE Performed at [facility], [address], [place], [place] [address]    Culture (A)  Final    <10,000 COLONIES/mL INSIGNIFICANT GROWTH Performed at [facility], [address] [place], [place] [address]    Report Status 12/28/2019 FINAL  Final  Culture, blood (single) w Reflex to ID Panel     Status: None (Preliminary result)   Collection Time: 01/02/20  7:10 PM   Specimen: BLOOD  Result Value Ref Range Status   Specimen Description BLOOD BLOOD LEFT HAND  Final   Special Requests   Final    BOTTLES DRAWN AEROBIC AND ANAEROBIC Blood Culture adequate volume   Culture   Final    NO GROWTH 3 DAYS Performed at [facility], [address], [place], [place] [address]    Report Status PENDING  Incomplete  Culture, respiratory (non-expectorated)     Status: None (Preliminary result)   Collection Time: 01/03/20  9:00 AM   Specimen:  Tracheal Aspirate; Respiratory  Result Value Ref Range Status   Specimen Description   Final    TRACHEAL ASPIRATE Performed at [facility], [address], [place], [place] [address]    Special Requests   Final    NONE Performed at [facility], [address], [place], [place]  [address]    Gram Stain   Final    MODERATE WBC PRESENT, PREDOMINANTLY PMN FEW GRAM POSITIVE COCCI    Culture   Final    TOO YOUNG TO READ Performed at [facility], [address] [place], [place] [address]    Report Status PENDING  Incomplete  MRSA PCR Screening     Status: None   Collection Time: 01/04/20  1:42 PM   Specimen: Nasal Mucosa; Nasopharyngeal  Result Value Ref Range Status   MRSA by PCR NEGATIVE NEGATIVE Final    Comment:        The GeneXpert MRSA Assay (FDA approved for NASAL specimens only), is one component of a comprehensive MRSA colonization surveillance program. It is not intended to diagnose MRSA infection nor to guide or monitor treatment for MRSA infections. Performed at [facility], [address] [place], [place] [address]            Indwelling Urinary Catheter continued, requirement due to   Reason to continue Indwelling Urinary Catheter strict Intake/Output monitoring for hemodynamic instability   Central Line/ continued, requirement due to  Reason to continue [employer] Monitoring of central venous pressure or other hemodynamic parameters and poor IV access   Ventilator continued, requirement due to severe respiratory failure   Ventilator Sedation RASS 0 to -2      ASSESSMENT AND PLAN SYNOPSIS  Acute hypoxemic respiratory failure due to COVID-19 pneumonia / ARDS Mechanical ventilation via ARDS protocol, target PRVC 6 cc/kg Wean PEEP and FiO2 as able Goal plateau pressure less than 30, driving pressure less than 15 Paralytics if necessary for vent synchrony, gas exchange Cycle prone positioning if necessary for  oxygenation Diuresis as blood pressure and renal function can tolerate, goal CVP 5-8.   diuresis as tolerated based on Kidney function VAP prevention order set IV STEROIDS    Severe ACUTE Hypoxic and Hypercapnic Respiratory Failure -continue Mechanical Ventilator support -continue Bronchodilator Therapy -Wean Fio2 and PEEP as tolerated -VAP/VENT bundle implementation   Morbid obesity, possible OSA.   Will certainly impact respiratory mechanics, ventilator weaning Suspect will need to consider additional PEEP   NEUROLOGY Acute toxic metabolic encephalopathy, need for sedation Goal RASS -2 to -3  CARDIAC ICU monitoring    GI GI PROPHYLAXIS as indicated  NUTRITIONAL STATUS DIET-->TF's as tolerated Constipation protocol as indicated  ENDO - will use ICU hypoglycemic\Hyperglycemia protocol if indicated   ELECTROLYTES -follow labs as needed -replace as needed -pharmacy consultation and following   DVT/GI PRX ordered and assessed TRANSFUSIONS AS NEEDED MONITOR FSBS I Assessed the need for Labs I Assessed the need for Foley I Assessed the need for Central Venous Line Family Discussion when available I Assessed the need for Mobilization I made an Assessment of medications to be adjusted accordingly Safety Risk assessment completed  CASE DISCUSSED IN MULTIDISCIPLINARY ROUNDS WITH ICU TEAM    Critical Care Time devoted to patient care services described in this note is 47 minutes.   Overall, patient is critically ill, prognosis is guarded.  Patient with Multiorgan failure and at high risk for cardiac arrest and death.    Lucie Leather, M.D.  [facility]  Medical Director [facility] Medical Director [facility] Cardio-Pulmonary Department

## 2020-01-05 NOTE — Progress Notes (Signed)
ANTICOAGULATION CONSULT NOTE  Pharmacy Consult for Heparin  Indication: pulmonary embolus  Patient Measurements: Height: 5\' 6"  (167.6 cm) Weight: 119.6 kg (263 lb 10.7 oz) IBW/kg (Calculated) : 59.3 Heparin Dosing Weight: 89.15 kg   Labs: Recent Labs    01/03/20 0514 01/03/20 0514 01/03/20 0823 01/04/20 0639 01/05/20 0450 01/05/20 0835 01/05/20 1435  HGB 10.0*   < >  --  9.8* 10.1*  --   --   HCT 29.9*  --   --  30.7* 31.6*  --   --   PLT 304  --   --  283 295  --   --   HEPARINUNFRC  --   --   --   --   --  0.46 0.24*  CREATININE  --   --  0.59 0.58 0.62  --   --    < > = values in this interval not displayed.    Estimated Creatinine Clearance: 103.4 mL/min (by C-G formula based on SCr of 0.62 mg/dL).   Medical History: Past Medical History:  Diagnosis Date   Hepatitis    Hypertension    Sciatica    Assessment: Patient is a 54 y/o F with medical history as above who is admitted with severe COVID-19 pneumonia with ARDS requiring intubation and mechanical ventilation. Now with concern for pulmonary embolus. Pharmacy has been consulted to initiate heparin infusion for high clinical suspicion for pulmonary embolus.   CBC notable for stable anemia  Goal of Therapy:  Heparin level 0.3-0.7 units/ml Monitor platelets by anticoagulation protocol: Yes   Plan:  --11/3 at 1435 HL = 0.24, subtherapeutic. Will order heparin 1000 unit IV bolus x 1 and increase heparin infusion to 1750 units/hr --Re-check HL 6 hours after rate change --Daily CBC per protocol  13/3 01/05/2020,3:37 PM

## 2020-01-05 NOTE — Progress Notes (Signed)
ANTICOAGULATION CONSULT NOTE - Initial Consult  Pharmacy Consult for Heparin  Indication: pulmonary embolus  No Known Allergies  Patient Measurements: Height: 5\' 6"  (167.6 cm) Weight: 124.2 kg (273 lb 13 oz) IBW/kg (Calculated) : 59.3 Heparin Dosing Weight: 89.15 kg   Vital Signs: Temp: 99 F (37.2 C) (11/03 0000) Temp Source: Esophageal (11/03 0000) BP: 102/63 (11/03 0000) Pulse Rate: 78 (11/03 0000)  Labs: Recent Labs    01/02/20 0457 01/02/20 0457 01/03/20 0514 01/03/20 0823 01/04/20 0639  HGB 10.4*   < > 10.0*  --  9.8*  HCT 33.8*  --  29.9*  --  30.7*  PLT 310  --  304  --  283  CREATININE 0.66  --   --  0.59 0.58   < > = values in this interval not displayed.    Estimated Creatinine Clearance: 105.7 mL/min (by C-G formula based on SCr of 0.58 mg/dL).   Medical History: Past Medical History:  Diagnosis Date  . Hepatitis   . Hypertension   . Sciatica     Medications:  Medications Prior to Admission  Medication Sig Dispense Refill Last Dose  . calcium citrate-vitamin D (CITRACAL+D) 315-200 MG-UNIT per tablet Take 2 tablets by mouth daily.     . ferrous sulfate 325 (65 FE) MG tablet Take 325 mg by mouth daily.     . multivitamin-iron-minerals-folic acid (CENTRUM) chewable tablet Chew 1 tablet by mouth daily.      13/02/21 telmisartan-hydrochlorothiazide (MICARDIS HCT) 40-12.5 MG tablet Take 1 tablet by mouth daily.   Unknown at Unknown    Assessment: Pharmacy consulted to dose heparin in this 54 year old female with possible PE.  Pt was on lovenox 60 mg SQ Q24H , last dose was on 11/2 @ 1933.  CrCl = 105.7 ml/min   Goal of Therapy:  Heparin level 0.3-0.7 units/ml Monitor platelets by anticoagulation protocol: Yes   Plan:  Pt received lovenox 60 mg SQ on 11/02 @ 1933, will not bolus this pt.  Start heparin infusion at 1600 units/hr Check anti-Xa level in 6 hours and daily while on heparin Continue to monitor H&H and platelets  Jadynn Epping  D 01/05/2020,1:07 AM

## 2020-01-05 NOTE — Progress Notes (Signed)
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 Patient is currently intubated, sedated, and on mechanical ventilation. Nutrition currently provided via tube feeds.   On 10/31 she was started on Lasix 20 mg IV q12h which completed 11/2. 2585 mL UOP documented yesterday.   Goal of Therapy:  Electrolytes WNL  Plan:   No electrolyte replacement indicated today  Hypernatremia: free water increased 10/30 to 200 mL per tube q4h (1.2L/day): sodium slightly increased today but expect secondary to IV Lasix and order no longer active  Re-check electrolytes in AM  Tressie Ellis 01/05/2020 8:42 AM

## 2020-01-05 NOTE — Progress Notes (Signed)
ANTICOAGULATION CONSULT NOTE  Pharmacy Consult for Heparin  Indication: pulmonary embolus  Patient Measurements: Height: 5\' 6"  (167.6 cm) Weight: 119.6 kg (263 lb 10.7 oz) IBW/kg (Calculated) : 59.3 Heparin Dosing Weight: 89.15 kg   Labs: Recent Labs    01/03/20 0514 01/03/20 0514 01/03/20 0823 01/04/20 0639 01/05/20 0450 01/05/20 0835  HGB 10.0*   < >  --  9.8* 10.1*  --   HCT 29.9*  --   --  30.7* 31.6*  --   PLT 304  --   --  283 295  --   HEPARINUNFRC  --   --   --   --   --  0.46  CREATININE  --   --  0.59 0.58 0.62  --    < > = values in this interval not displayed.    Estimated Creatinine Clearance: 103.4 mL/min (by C-G formula based on SCr of 0.62 mg/dL).   Medical History: Past Medical History:  Diagnosis Date   Hepatitis    Hypertension    Sciatica    Assessment: Patient is a 54 y/o F with medical history as above who is admitted with severe COVID-19 pneumonia with ARDS requiring intubation and mechanical ventilation. Now with concern for pulmonary embolus. Pharmacy has been consulted to initiate heparin infusion for high clinical suspicion for pulmonary embolus.   CBC notable for stable anemia  Goal of Therapy:  Heparin level 0.3-0.7 units/ml Monitor platelets by anticoagulation protocol: Yes   Plan:  --11/3 at 0835 HL = 0.46, therapeutic x 1. Will continue heparin infusion at 1600 units/hr --Re-check confirmatory HL in 6 hours --Daily CBC per protocol  13/3 01/05/2020,10:55 AM

## 2020-01-06 ENCOUNTER — Inpatient Hospital Stay: Payer: BC Managed Care – PPO

## 2020-01-06 ENCOUNTER — Inpatient Hospital Stay (HOSPITAL_COMMUNITY)
Admit: 2020-01-06 | Discharge: 2020-01-06 | Disposition: A | Discharge status: Home / Self Care | Payer: BC Managed Care – PPO | Attending: Pulmonary Disease | Admitting: Pulmonary Disease

## 2020-01-06 DIAGNOSIS — J9600 Acute respiratory failure, unspecified whether with hypoxia or hypercapnia: Secondary | ICD-10-CM | POA: Diagnosis not present

## 2020-01-06 DIAGNOSIS — U071 COVID-19: Secondary | ICD-10-CM | POA: Diagnosis not present

## 2020-01-06 DIAGNOSIS — I428 Other cardiomyopathies: Secondary | ICD-10-CM

## 2020-01-06 LAB — BLOOD GAS, ARTERIAL
Acid-Base Excess: 10.3 mmol/L — ABNORMAL HIGH (ref 0.0–2.0)
Acid-Base Excess: 11.9 mmol/L — ABNORMAL HIGH (ref 0.0–2.0)
Acid-Base Excess: 12.8 mmol/L — ABNORMAL HIGH (ref 0.0–2.0)
Bicarbonate: 37.9 mmol/L — ABNORMAL HIGH (ref 20.0–28.0)
Bicarbonate: 38.2 mmol/L — ABNORMAL HIGH (ref 20.0–28.0)
Bicarbonate: 39.6 mmol/L — ABNORMAL HIGH (ref 20.0–28.0)
FIO2: 1
FIO2: 1
FIO2: 1
MECHVT: 400 mL
MECHVT: 400 mL
MECHVT: 400 mL
Mechanical Rate: 30
Mechanical Rate: 30
O2 Saturation: 91.9 %
O2 Saturation: 95 %
O2 Saturation: 95.6 %
PEEP: 16 cmH2O
PEEP: 16 cmH2O
PEEP: 16 cmH2O
Patient temperature: 36.4
Patient temperature: 36.6
Patient temperature: 37
RATE: 30 resp/min
RATE: 30 resp/min
RATE: 30 resp/min
pCO2 arterial: 55 mmHg — ABNORMAL HIGH (ref 32.0–48.0)
pCO2 arterial: 60 mmHg — ABNORMAL HIGH (ref 32.0–48.0)
pCO2 arterial: 65 mmHg — ABNORMAL HIGH (ref 32.0–48.0)
pH, Arterial: 7.37 (ref 7.350–7.450)
pH, Arterial: 7.43 (ref 7.350–7.450)
pH, Arterial: 7.45 (ref 7.350–7.450)
pO2, Arterial: 63 mmHg — ABNORMAL LOW (ref 83.0–108.0)
pO2, Arterial: 72 mmHg — ABNORMAL LOW (ref 83.0–108.0)
pO2, Arterial: 76 mmHg — ABNORMAL LOW (ref 83.0–108.0)

## 2020-01-06 LAB — CBC WITH DIFFERENTIAL/PLATELET
Abs Immature Granulocytes: 1.24 10*3/uL — ABNORMAL HIGH (ref 0.00–0.07)
Basophils Absolute: 0.1 10*3/uL (ref 0.0–0.1)
Basophils Relative: 0 %
Eosinophils Absolute: 0.1 10*3/uL (ref 0.0–0.5)
Eosinophils Relative: 0 %
HCT: 33 % — ABNORMAL LOW (ref 36.0–46.0)
Hemoglobin: 10.4 g/dL — ABNORMAL LOW (ref 12.0–15.0)
Immature Granulocytes: 7 %
Lymphocytes Relative: 5 %
Lymphs Abs: 1 10*3/uL (ref 0.7–4.0)
MCH: 29.7 pg (ref 26.0–34.0)
MCHC: 31.5 g/dL (ref 30.0–36.0)
MCV: 94.3 fL (ref 80.0–100.0)
Monocytes Absolute: 1.3 10*3/uL — ABNORMAL HIGH (ref 0.1–1.0)
Monocytes Relative: 7 %
Neutro Abs: 14.4 10*3/uL — ABNORMAL HIGH (ref 1.7–7.7)
Neutrophils Relative %: 81 %
Platelets: 292 10*3/uL (ref 150–400)
RBC: 3.5 MIL/uL — ABNORMAL LOW (ref 3.87–5.11)
RDW: 12.6 % (ref 11.5–15.5)
Smear Review: NORMAL
WBC: 18 10*3/uL — ABNORMAL HIGH (ref 4.0–10.5)
nRBC: 0.1 % (ref 0.0–0.2)

## 2020-01-06 LAB — BASIC METABOLIC PANEL
Anion gap: 10 (ref 5–15)
BUN: 33 mg/dL — ABNORMAL HIGH (ref 6–20)
CO2: 32 mmol/L (ref 22–32)
Calcium: 8.1 mg/dL — ABNORMAL LOW (ref 8.9–10.3)
Chloride: 100 mmol/L (ref 98–111)
Creatinine, Ser: 0.54 mg/dL (ref 0.44–1.00)
GFR, Estimated: >60 mL/min (ref >60)
Glucose, Bld: 164 mg/dL — ABNORMAL HIGH (ref 70–99)
Potassium: 4.3 mmol/L (ref 3.5–5.1)
Sodium: 142 mmol/L (ref 135–145)

## 2020-01-06 LAB — HEPARIN LEVEL (UNFRACTIONATED)
Heparin Unfractionated: 0.37 IU/mL (ref 0.30–0.70)
Heparin Unfractionated: 0.37 IU/mL (ref 0.30–0.70)

## 2020-01-06 LAB — CULTURE, RESPIRATORY W GRAM STAIN

## 2020-01-06 LAB — GLUCOSE, CAPILLARY
Glucose-Capillary: 132 mg/dL — ABNORMAL HIGH (ref 70–99)
Glucose-Capillary: 137 mg/dL — ABNORMAL HIGH (ref 70–99)
Glucose-Capillary: 141 mg/dL — ABNORMAL HIGH (ref 70–99)
Glucose-Capillary: 141 mg/dL — ABNORMAL HIGH (ref 70–99)
Glucose-Capillary: 143 mg/dL — ABNORMAL HIGH (ref 70–99)
Glucose-Capillary: 167 mg/dL — ABNORMAL HIGH (ref 70–99)

## 2020-01-06 LAB — TRIGLYCERIDES: Triglycerides: 105 mg/dL (ref <150)

## 2020-01-06 LAB — FIBRIN DERIVATIVES D-DIMER (ARMC ONLY): Fibrin derivatives D-dimer (ARMC): 1891.08 ng/mL (FEU) — ABNORMAL HIGH (ref 0.00–499.00)

## 2020-01-06 LAB — FERRITIN: Ferritin: 1028 ng/mL — ABNORMAL HIGH (ref 11–307)

## 2020-01-06 LAB — MAGNESIUM: Magnesium: 2.6 mg/dL — ABNORMAL HIGH (ref 1.7–2.4)

## 2020-01-06 LAB — PHOSPHORUS: Phosphorus: 3.8 mg/dL (ref 2.5–4.6)

## 2020-01-06 LAB — C-REACTIVE PROTEIN: CRP: 18.4 mg/dL — ABNORMAL HIGH (ref <1.0)

## 2020-01-06 MED ORDER — CEFAZOLIN SODIUM-DEXTROSE 2-4 GM/100ML-% IV SOLN
2.0000 g | Freq: Three times a day (TID) | INTRAVENOUS | Status: AC
  Administered 2020-01-06 – 2020-01-09 (×10): 2 g via INTRAVENOUS
  Filled 2020-01-06 (×11): qty 100

## 2020-01-06 MED ORDER — SODIUM CHLORIDE 0.9% FLUSH
10.0000 mL | Freq: Two times a day (BID) | INTRAVENOUS | Status: DC
  Administered 2020-01-06: 10 mL

## 2020-01-06 MED ORDER — SODIUM CHLORIDE 0.9% FLUSH
10.0000 mL | INTRAVENOUS | Status: DC | PRN
  Administered 2020-01-15: 10 mL

## 2020-01-06 NOTE — Progress Notes (Signed)
*  PRELIMINARY RESULTS* Echocardiogram 2D Echocardiogram has been performed.  Carrie Davies 01/06/2020, 9:51 AM

## 2020-01-06 NOTE — Progress Notes (Signed)
ANTICOAGULATION CONSULT NOTE  Pharmacy Consult for Heparin  Indication: pulmonary embolus  Patient Measurements: Height: 5\' 6"  (167.6 cm) Weight: 119.6 kg (263 lb 10.7 oz) IBW/kg (Calculated) : 59.3 Heparin Dosing Weight: 89.15 kg   Labs: Recent Labs    01/03/20 0514 01/03/20 0514 01/03/20 0823 01/04/20 0639 01/05/20 0450 01/05/20 0835 01/05/20 1435 01/05/20 2105 01/06/20 0300  HGB 10.0*   < >  --  9.8* 10.1*  --   --   --   --   HCT 29.9*  --   --  30.7* 31.6*  --   --   --   --   PLT 304  --   --  283 295  --   --   --   --   HEPARINUNFRC  --   --   --   --   --    < > 0.24* 0.37 0.37  CREATININE  --   --  0.59 0.58 0.62  --   --   --   --    < > = values in this interval not displayed.    Estimated Creatinine Clearance: 103.4 mL/min (by C-G formula based on SCr of 0.62 mg/dL).   Medical History: Past Medical History:  Diagnosis Date   Hepatitis    Hypertension    Sciatica    Assessment: Patient is a 54 y/o F with medical history as above who is admitted with severe COVID-19 pneumonia with ARDS requiring intubation and mechanical ventilation. Now with concern for pulmonary embolus. Pharmacy has been consulted to initiate heparin infusion for high clinical suspicion for pulmonary embolus.   CBC notable for stable anemia  Goal of Therapy:  Heparin level 0.3-0.7 units/ml Monitor platelets by anticoagulation protocol: Yes   Plan:  --11/3 at 1435 HL = 0.24, subtherapeutic. Will order heparin 1000 unit IV bolus x 1 and increase heparin infusion to 1750 units/hr --Re-check HL 6 hours after rate change --Daily CBC per protocol  11/3:  HL @ 2105 = 0.37 Will continue pt on current rate and draw confirmation level in 6 hrs on 11/4 @ 0300.   11/4: HL @ 0300 = 0.37 Will continue pt on current rate and recheck HL on 11/5 with AM labs.   Tayron Hunnell D 01/06/2020,4:19 AM

## 2020-01-06 NOTE — Progress Notes (Signed)
Patient proned at 0200. ETT secured at 23cm using cloth tape. Vent settings to remain the same at this time. Follow up ABG ordered.

## 2020-01-06 NOTE — Progress Notes (Signed)
CRITICAL CARE NOTE  SYNOPSIS 54 year old remote former smoker, with no chronic significant medical issues other than obesity and hypertension, who presents for evaluation of shortness of breath over the last week. The patient states that approximately 6 weeks ago she developed a dry hacking cough which is not unusual for her during "allergy season and change of season". However, approximately a week ago she noted fever and congestion and feeling significantly short of breath. Despite this she went to have her annual medical exam done and had a flu vaccine given at that time. She not vaccinated against COVID-19. She has had some chest discomfort when coughing but not at any other time. No orthopnea or paroxysmal nocturnal dyspnea until last night. Noted that she could not lay flat to sleep. He has not had any abdominal pain, no nausea, vomiting or bowel habit disruption. She has had mild to moderate headache for the last week. Cough became almost constant a day ago which prompted her to come to the emergency room today. Aside from this she voices no other complaint.  She was evaluated at the emergency room where she was noted to be tachypneic and have a temperature of 101.6 F on arrival. She also was noted to have oxygen saturations of 56% on room air. She was placed on heated high flow O2 and nonrebreather mask and is now saturating 100%. PCCM has been asked to admit the patient to stepdown/ICU.   10/24 severe hypoxia 10/25 intubation and MV support and severe hypoxia, ETT tube exchanged 10/26-10/31 severe hypoxia and severe ARDS 11/1 high risk for cardiac arrest and death Jan 16, 2023 severe ARDS, s/p proning 11/4 severe hypoxia-did NOT tolerate proning  CC  follow up respiratory failure  SUBJECTIVE Patient remains critically ill Prognosis is guarded   BP 111/68   Pulse 69   Temp (!) 97.5 F (36.4 C) (Esophageal)   Resp 20   Ht 5\' 6"  (1.676 m)   Wt 119.6 kg   LMP 08/16/2014    SpO2 92%   BMI 42.56 kg/m    I/O last 3 completed shifts: In: 2105.6 [I.V.:361; NG/GT:1536.8; IV Piggyback:207.8] Out: 3600 [Urine:3600] Total I/O In: 3046.9 [I.V.:667.7; NG/GT:2079.2; IV Piggyback:300] Out: 0   SpO2: 92 % O2 Flow Rate (L/min): 0 L/min (no flow data to report) FiO2 (%): 100 %  Estimated body mass index is 42.56 kg/m as calculated from the following:   Height as of this encounter: 5\' 6"  (1.676 m).   Weight as of this encounter: 119.6 kg.  SIGNIFICANT EVENTS   REVIEW OF SYSTEMS  PATIENT IS UNABLE TO PROVIDE COMPLETE REVIEW OF SYSTEMS DUE TO SEVERE CRITICAL ILLNESS      COVID-19 DISASTER DECLARATION:   FULL CONTACT PHYSICAL EXAMINATION WAS NOT POSSIBLE DUE TO TREATMENT OF COVID-19  AND CONSERVATION OF PERSONAL PROTECTIVE EQUIPMENT, LIMITED EXAM FINDINGS INCLUDE-   PHYSICAL EXAMINATION:  GENERAL:critically ill appearing, +resp distress NEUROLOGIC: obtunded, GCS<8   Patient assessed or the symptoms described in the history of present illness.  In the context of the Global COVID-19 pandemic, which necessitated consideration that the patient might be at risk for infection with the SARS-CoV-2 virus that causes COVID-19, Institutional protocols and algorithms that pertain to the evaluation of patients at risk for COVID-19 are in a state of rapid change based on information released by regulatory bodies including the CDC and federal and state organizations. These policies and algorithms were followed during the patient's care while in hospital.    MEDICATIONS: I have reviewed all medications and  confirmed regimen as documented   CULTURE RESULTS   Recent Results (from the past 240 hour(s))  Culture, blood (single) w Reflex to ID Panel     Status: None (Preliminary result)   Collection Time: 01/02/20  7:10 PM   Specimen: BLOOD  Result Value Ref Range Status   Specimen Description BLOOD BLOOD LEFT HAND  Final   Special Requests   Final    BOTTLES DRAWN  AEROBIC AND ANAEROBIC Blood Culture adequate volume   Culture   Final    NO GROWTH 4 DAYS Performed at [facility], [address] [place], [place] [address]    Report Status PENDING  Incomplete  Culture, respiratory (non-expectorated)     Status: None (Preliminary result)   Collection Time: 01/03/20  9:00 AM   Specimen: Tracheal Aspirate; Respiratory  Result Value Ref Range Status   Specimen Description   Final    TRACHEAL ASPIRATE Performed at [facility], [address], [place], [place] [address]    Special Requests   Final    NONE Performed at [facility], [address] [place], [place] [address]    Gram Stain   Final    MODERATE WBC PRESENT, PREDOMINANTLY PMN FEW GRAM POSITIVE COCCI    Culture   Final    RARE STAPHYLOCOCCUS AUREUS SUSCEPTIBILITIES TO FOLLOW Performed at [facility], [address], [place], [place] [address]    Report Status PENDING  Incomplete  MRSA PCR Screening     Status: None   Collection Time: 01/04/20  1:42 PM   Specimen: Nasal Mucosa; Nasopharyngeal  Result Value Ref Range Status   MRSA by PCR NEGATIVE NEGATIVE Final    Comment:        The GeneXpert MRSA Assay (FDA approved for NASAL specimens only), is one component of a comprehensive MRSA colonization surveillance program. It is not intended to diagnose MRSA infection nor to guide or monitor treatment for MRSA infections. Performed at [facility], [address] [place], [place] [address]           IMAGING    US Venous Img Lower Bilateral (DVT)  Result Date: 01/05/2020 CLINICAL DATA:  Bilateral leg pain, edema EXAM: BILATERAL LOWER EXTREMITY VENOUS DOPPLER ULTRASOUND TECHNIQUE: Gray-scale sonography with graded compression, as well as color Doppler and duplex ultrasound were performed to evaluate the lower extremity deep venous systems from the level of the common femoral vein and including the common femoral, femoral,  profunda femoral, popliteal and calf veins including the posterior tibial, peroneal and gastrocnemius veins when visible. The superficial great saphenous vein was also interrogated. Spectral Doppler was utilized to evaluate flow at rest and with distal augmentation maneuvers in the common femoral, femoral and popliteal veins. COMPARISON:  None. FINDINGS: RIGHT LOWER EXTREMITY Common Femoral Vein: No evidence of thrombus. Normal compressibility, respiratory phasicity and response to augmentation. Saphenofemoral Junction: No evidence of thrombus. Normal compressibility and flow on color Doppler imaging. Profunda Femoral Vein: No evidence of thrombus. Normal compressibility and flow on color Doppler imaging. Femoral Vein: No evidence of thrombus. Normal compressibility, respiratory phasicity and response to augmentation. Popliteal Vein: No evidence of thrombus. Normal compressibility, respiratory phasicity and response to augmentation. Calf Veins: 1 of the peroneal veins demonstrates occlusive thrombus within the right calf. Superficial Great Saphenous Vein: No evidence of thrombus. Normal compressibility. Venous Reflux:  None. Other Findings:  None. LEFT LOWER EXTREMITY Common Femoral Vein: No evidence of thrombus. Normal compressibility, respiratory phasicity and response  to augmentation. Saphenofemoral Junction: No evidence of thrombus. Normal compressibility and flow on color Doppler imaging. Profunda Femoral Vein: No evidence of thrombus. Normal compressibility and flow on color Doppler imaging. Femoral Vein: No evidence of thrombus. Normal compressibility, respiratory phasicity and response to augmentation. Popliteal Vein: No evidence of thrombus. Normal compressibility, respiratory phasicity and response to augmentation. Calf Veins: No evidence of thrombus. Normal compressibility and flow on color Doppler imaging. Superficial Great Saphenous Vein: No evidence of thrombus. Normal compressibility. Venous Reflux:   None. Other Findings:  None. IMPRESSION: Thrombosis of 1 of the right calf peroneal veins. Otherwise negative. Electronically Signed   By: Charlett Nose M.D.   On: 01/05/2020 19:43   DG Chest Port 1 View  Result Date: 01/06/2020 CLINICAL DATA:  Shortness of breath.  COVID EXAM: PORTABLE CHEST 1 VIEW COMPARISON:  01/05/2020. FINDINGS: Endotracheal tube, NG tube, right PICC line in stable position. Heart size normal. Bibasilar atelectasis. Diffuse bilateral pulmonary infiltrates again noted without interim change. No pleural effusion or pneumothorax. IMPRESSION: 1. Lines and tubes in stable position. 2. Bibasilar atelectasis. Diffuse bilateral pulmonary infiltrates again noted without interim change. Electronically Signed   By: Maisie Fus  Register   On: 01/06/2020 05:37   DG Chest Port 1 View  Result Date: 01/05/2020 CLINICAL DATA:  Shortness of breath.  COVID pneumonia. EXAM: PORTABLE CHEST 1 VIEW COMPARISON:  01/03/2020 FINDINGS: The endotracheal tube tip is stable above the carina. NG tube tip is below the GE junction. Right arm PICC line tip is in the distal SVC. Stable cardiomediastinal contours. No change in bilateral interstitial and airspace densities. IMPRESSION: 1. Stable support apparatus. 2. No change in bilateral interstitial and airspace densities compatible with COVID-19 pneumonia Electronically Signed   By: Signa Kell M.D.   On: 01/05/2020 08:51     Nutrition Status: Nutrition Problem: Inadequate oral intake Etiology: inability to eat Signs/Symptoms: NPO status Interventions: Tube feeding, Prostat, MVI     Indwelling Urinary Catheter continued, requirement due to   Reason to continue Indwelling Urinary Catheter strict Intake/Output monitoring for hemodynamic instability   Central Line/ continued, requirement due to  Reason to continue [employer] Monitoring of central venous pressure or other hemodynamic parameters and poor IV access   Ventilator continued, requirement due to  severe respiratory failure   Ventilator Sedation RASS 0 to -2      ASSESSMENT AND PLAN SYNOPSIS  SEVERE Acute hypoxemic respiratory failure due to COVID-19 pneumonia / ARDS Mechanical ventilation via ARDS protocol, target PRVC 6 cc/kg Wean PEEP and FiO2 as able Goal plateau pressure less than 30, driving pressure less than 15 Paralytics if necessary for vent synchrony, gas exchange Cycle prone positioning if necessary for oxygenation Deep sedation per PAD protocol, goal RASS -4,  Diuresis as blood pressure and renal function can tolerate, goal CVP 5-8.   diuresis as tolerated based on Kidney function VAP prevention order set IV STEROIDS     Severe ACUTE Hypoxic and Hypercapnic Respiratory Failure -continue Full MV support -continue Bronchodilator Therapy -Wean Fio2 and PEEP as tolerated -VAP/VENT bundle implementation  Morbid obesity, possible OSA.   Will certainly impact respiratory mechanics, ventilator weaning Suspect will need to consider additional PEEP    NEUROLOGY Acute toxic metabolic encephalopathy, need for sedation Goal RASS -2 to -3  CARDIAC ICU monitoring  ID -continue IV abx as prescibed -follow up cultures  GI GI PROPHYLAXIS as indicated   DIET-->TF's as tolerated Constipation protocol as indicated  ENDO - will use ICU hypoglycemic\Hyperglycemia protocol  if indicated     ELECTROLYTES -follow labs as needed -replace as needed -pharmacy consultation and following   DVT/GI PRX ordered and assessed TRANSFUSIONS AS NEEDED MONITOR FSBS I Assessed the need for Labs I Assessed the need for Foley I Assessed the need for Central Venous Line Family Discussion when available I Assessed the need for Mobilization I made an Assessment of medications to be adjusted accordingly Safety Risk assessment completed   CASE DISCUSSED IN MULTIDISCIPLINARY ROUNDS WITH ICU TEAM  Critical Care Time devoted to patient care services described in this  note is 56 minutes.   Overall, patient is critically ill, prognosis is guarded.  Patient with Multiorgan failure and at high risk for cardiac arrest and death.    Lucie Leather, M.D.  [facility]  Medical Director [facility] Medical Director [facility] Cardio-Pulmonary Department

## 2020-01-06 NOTE — Progress Notes (Signed)
ANTICOAGULATION CONSULT NOTE  Pharmacy Consult for Heparin  Indication: pulmonary embolus  Patient Measurements: Height: 5\' 6"  (167.6 cm) Weight: 119.6 kg (263 lb 10.7 oz) IBW/kg (Calculated) : 59.3 Heparin Dosing Weight: 89.15 kg   Labs: Recent Labs    01/03/20 0514 01/03/20 0514 01/03/20 0823 01/04/20 0639 01/05/20 0450 01/05/20 0835 01/05/20 1435 01/05/20 2105  HGB 10.0*   < >  --  9.8* 10.1*  --   --   --   HCT 29.9*  --   --  30.7* 31.6*  --   --   --   PLT 304  --   --  283 295  --   --   --   HEPARINUNFRC  --   --   --   --   --  0.46 0.24* 0.37  CREATININE  --   --  0.59 0.58 0.62  --   --   --    < > = values in this interval not displayed.    Estimated Creatinine Clearance: 103.4 mL/min (by C-G formula based on SCr of 0.62 mg/dL).   Medical History: Past Medical History:  Diagnosis Date  . Hepatitis   . Hypertension   . Sciatica    Assessment: Patient is a 54 y/o F with medical history as above who is admitted with severe COVID-19 pneumonia with ARDS requiring intubation and mechanical ventilation. Now with concern for pulmonary embolus. Pharmacy has been consulted to initiate heparin infusion for high clinical suspicion for pulmonary embolus.   CBC notable for stable anemia  Goal of Therapy:  Heparin level 0.3-0.7 units/ml Monitor platelets by anticoagulation protocol: Yes   Plan:  --11/3 at 1435 HL = 0.24, subtherapeutic. Will order heparin 1000 unit IV bolus x 1 and increase heparin infusion to 1750 units/hr --Re-check HL 6 hours after rate change --Daily CBC per protocol  11/3:  HL @ 2105 = 0.37 Will continue pt on current rate and draw confirmation level in 6 hrs on 11/4 @ 0300.   Marolyn Urschel D 01/06/2020,12:56 AM

## 2020-01-06 NOTE — Progress Notes (Signed)
Pt has gag reflex when deep suctioned, otherwise unresponsive. No need for prn versed/vecuronium thus far on this RNs shift. RN spoke to pt husband on phone for update. Per husband he was covid positive at the same time the pt was. RN explained that unfortunately he would not be able to visit until 21 days post positive result. Pt VSS. No proneing this shift per MD. Will continue to monitor.

## 2020-01-06 NOTE — Progress Notes (Signed)
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 Patient is currently intubated, sedated, and on mechanical ventilation. Nutrition currently provided via tube feeds.   Goal of Therapy:  Electrolytes WNL  Plan:   No electrolyte replacement indicated today  Hypernatremia: free water increased 10/30 to 200 mL per tube q4h (1.2L/day): sodium now stable  BMP tomorrow AM. Magnesium and phosphorous have been stable no need to check daily  Tressie Ellis 01/06/2020 7:43 AM

## 2020-01-06 NOTE — Progress Notes (Signed)
PRN paralytic and versed administered for ventilator dyssynchrony and desaturation to mid 80's this evening. Oxygen saturation appears to slowly recover after paralytic administration. Currently oxygen saturation is 90%.

## 2020-01-06 NOTE — Progress Notes (Signed)
Patient placed back in supine position. ETT secured at 23cm with commercial tube holder. Vent settings remain the same at this time.

## 2020-01-07 DIAGNOSIS — J80 Acute respiratory distress syndrome: Secondary | ICD-10-CM | POA: Diagnosis not present

## 2020-01-07 DIAGNOSIS — J9600 Acute respiratory failure, unspecified whether with hypoxia or hypercapnia: Secondary | ICD-10-CM | POA: Diagnosis not present

## 2020-01-07 DIAGNOSIS — U071 COVID-19: Secondary | ICD-10-CM | POA: Diagnosis not present

## 2020-01-07 LAB — BLOOD GAS, ARTERIAL
Acid-Base Excess: 13.2 mmol/L — ABNORMAL HIGH (ref 0.0–2.0)
Bicarbonate: 41.8 mmol/L — ABNORMAL HIGH (ref 20.0–28.0)
FIO2: 1
MECHVT: 400 mL
Mechanical Rate: 30
O2 Saturation: 88.9 %
PEEP: 10 cmH2O
Patient temperature: 37
RATE: 30 resp/min
pCO2 arterial: 69 mmHg (ref 32.0–48.0)
pH, Arterial: 7.39 (ref 7.350–7.450)
pO2, Arterial: 57 mmHg — ABNORMAL LOW (ref 83.0–108.0)

## 2020-01-07 LAB — CBC WITH DIFFERENTIAL/PLATELET
Abs Immature Granulocytes: 1.12 10*3/uL — ABNORMAL HIGH (ref 0.00–0.07)
Basophils Absolute: 0 10*3/uL (ref 0.0–0.1)
Basophils Relative: 0 %
Eosinophils Absolute: 0.1 10*3/uL (ref 0.0–0.5)
Eosinophils Relative: 0 %
HCT: 30.1 % — ABNORMAL LOW (ref 36.0–46.0)
Hemoglobin: 9.5 g/dL — ABNORMAL LOW (ref 12.0–15.0)
Immature Granulocytes: 7 %
Lymphocytes Relative: 6 %
Lymphs Abs: 1.1 10*3/uL (ref 0.7–4.0)
MCH: 30 pg (ref 26.0–34.0)
MCHC: 31.6 g/dL (ref 30.0–36.0)
MCV: 95 fL (ref 80.0–100.0)
Monocytes Absolute: 1.2 10*3/uL — ABNORMAL HIGH (ref 0.1–1.0)
Monocytes Relative: 7 %
Neutro Abs: 13.7 10*3/uL — ABNORMAL HIGH (ref 1.7–7.7)
Neutrophils Relative %: 80 %
Platelets: 276 10*3/uL (ref 150–400)
RBC: 3.17 MIL/uL — ABNORMAL LOW (ref 3.87–5.11)
RDW: 12.6 % (ref 11.5–15.5)
Smear Review: NORMAL
WBC: 17.1 10*3/uL — ABNORMAL HIGH (ref 4.0–10.5)
nRBC: 0.2 % (ref 0.0–0.2)

## 2020-01-07 LAB — FIBRIN DERIVATIVES D-DIMER (ARMC ONLY): Fibrin derivatives D-dimer (ARMC): 1539.62 ng/mL (FEU) — ABNORMAL HIGH (ref 0.00–499.00)

## 2020-01-07 LAB — GLUCOSE, CAPILLARY
Glucose-Capillary: 124 mg/dL — ABNORMAL HIGH (ref 70–99)
Glucose-Capillary: 135 mg/dL — ABNORMAL HIGH (ref 70–99)
Glucose-Capillary: 144 mg/dL — ABNORMAL HIGH (ref 70–99)
Glucose-Capillary: 161 mg/dL — ABNORMAL HIGH (ref 70–99)
Glucose-Capillary: 169 mg/dL — ABNORMAL HIGH (ref 70–99)
Glucose-Capillary: 175 mg/dL — ABNORMAL HIGH (ref 70–99)

## 2020-01-07 LAB — PROCALCITONIN: Procalcitonin: <0.1 ng/mL

## 2020-01-07 LAB — CULTURE, BLOOD (SINGLE)
Culture: NO GROWTH
Special Requests: ADEQUATE

## 2020-01-07 LAB — FERRITIN: Ferritin: 758 ng/mL — ABNORMAL HIGH (ref 11–307)

## 2020-01-07 LAB — BASIC METABOLIC PANEL
Anion gap: 10 (ref 5–15)
BUN: 32 mg/dL — ABNORMAL HIGH (ref 6–20)
CO2: 30 mmol/L (ref 22–32)
Calcium: 8.6 mg/dL — ABNORMAL LOW (ref 8.9–10.3)
Chloride: 101 mmol/L (ref 98–111)
Creatinine, Ser: 0.61 mg/dL (ref 0.44–1.00)
GFR, Estimated: >60 mL/min (ref >60)
Glucose, Bld: 178 mg/dL — ABNORMAL HIGH (ref 70–99)
Potassium: 5.1 mmol/L (ref 3.5–5.1)
Sodium: 141 mmol/L (ref 135–145)

## 2020-01-07 LAB — HEPARIN LEVEL (UNFRACTIONATED): Heparin Unfractionated: 0.37 IU/mL (ref 0.30–0.70)

## 2020-01-07 LAB — ECHOCARDIOGRAM COMPLETE
Height: 66 in
S' Lateral: 2.57 cm
Weight: 4218.72 oz

## 2020-01-07 LAB — C-REACTIVE PROTEIN: CRP: 10.6 mg/dL — ABNORMAL HIGH (ref <1.0)

## 2020-01-07 MED ORDER — ARTIFICIAL TEARS OPHTHALMIC OINT
1.0000 "application " | TOPICAL_OINTMENT | Freq: Three times a day (TID) | OPHTHALMIC | Status: DC
  Administered 2020-01-08: 1 via OPHTHALMIC
  Filled 2020-01-07: qty 3.5

## 2020-01-07 MED ORDER — VECURONIUM BROMIDE 10 MG IV SOLR
0.0000 ug/kg/min | Status: AC
  Administered 2020-01-07: 1 ug/kg/min via INTRAVENOUS
  Administered 2020-01-08: 1.4 ug/kg/min via INTRAVENOUS
  Administered 2020-01-08: 1.7 ug/kg/min via INTRAVENOUS
  Administered 2020-01-08: 1 ug/kg/min via INTRAVENOUS
  Administered 2020-01-09: 1.4 ug/kg/min via INTRAVENOUS
  Administered 2020-01-09: 1 ug/kg/min via INTRAVENOUS
  Filled 2020-01-07 (×6): qty 100

## 2020-01-07 MED ORDER — VECURONIUM BROMIDE 10 MG IV SOLR
10.0000 mg | Freq: Once | INTRAVENOUS | Status: AC
  Administered 2020-01-13: 10 mg via INTRAVENOUS
  Filled 2020-01-07: qty 10

## 2020-01-07 MED ORDER — CISATRACURIUM BOLUS VIA INFUSION
0.1000 mg/kg | Freq: Once | INTRAVENOUS | Status: AC
  Administered 2020-01-07: 12 mg via INTRAVENOUS
  Filled 2020-01-07: qty 12

## 2020-01-07 MED ORDER — FUROSEMIDE 10 MG/ML IJ SOLN
40.0000 mg | Freq: Three times a day (TID) | INTRAMUSCULAR | Status: DC
  Administered 2020-01-07 – 2020-01-10 (×9): 40 mg via INTRAVENOUS
  Filled 2020-01-07 (×9): qty 4

## 2020-01-07 MED ORDER — STERILE WATER FOR INJECTION IJ SOLN
INTRAMUSCULAR | Status: AC
  Filled 2020-01-07: qty 10

## 2020-01-07 MED ORDER — VECURONIUM BOLUS VIA INFUSION
10.0000 mg | Freq: Once | INTRAVENOUS | Status: AC
  Administered 2020-01-07: 10 mg via INTRAVENOUS
  Filled 2020-01-07: qty 10

## 2020-01-07 MED ORDER — SODIUM CHLORIDE 0.9 % IV SOLN
0.0000 ug/kg/min | INTRAVENOUS | Status: DC
  Administered 2020-01-07: 3 ug/kg/min via INTRAVENOUS
  Filled 2020-01-07: qty 20

## 2020-01-07 NOTE — Progress Notes (Signed)
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 Patient is currently intubated, sedated, and on mechanical ventilation. Nutrition currently provided via tube feeds.   Goal of Therapy:  Electrolytes WNL  Plan:   No electrolyte replacement indicated today. Potassium 5.1 from 4.3 yesterday. Continue to monitor.  Hypernatremia: free water increased 10/30 to 200 mL per tube q4h (1.2L/day): sodium now stable  BMP tomorrow AM  Tressie Ellis 01/07/2020 8:21 AM

## 2020-01-07 NOTE — Progress Notes (Signed)
1625 Nimbex bolus and drip started

## 2020-01-07 NOTE — Progress Notes (Signed)
ANTICOAGULATION CONSULT NOTE  Pharmacy Consult for Heparin  Indication: pulmonary embolus  Patient Measurements: Height: 5\' 6"  (167.6 cm) Weight: 119.8 kg (264 lb 1.8 oz) IBW/kg (Calculated) : 59.3 Heparin Dosing Weight: 89.15 kg   Labs: Recent Labs    01/04/20 0639 01/04/20 0639 01/05/20 0450 01/05/20 0450 01/05/20 0835 01/05/20 2105 01/06/20 0300 01/06/20 0507 01/07/20 0410  HGB 9.8*   < > 10.1*   < >  --   --   --  10.4* 9.5*  HCT 30.7*   < > 31.6*  --   --   --   --  33.0* 30.1*  PLT 283   < > 295  --   --   --   --  292 276  HEPARINUNFRC  --   --   --   --    < > 0.37 0.37  --  0.37  CREATININE 0.58  --  0.62  --   --   --   --  0.54  --    < > = values in this interval not displayed.    Estimated Creatinine Clearance: 103.5 mL/min (by C-G formula based on SCr of 0.54 mg/dL).   Medical History: Past Medical History:  Diagnosis Date  . Hepatitis   . Hypertension   . Sciatica    Assessment: Patient is a 54 y/o F with medical history as above who is admitted with severe COVID-19 pneumonia with ARDS requiring intubation and mechanical ventilation. Now with concern for pulmonary embolus. Pharmacy has been consulted to initiate heparin infusion for high clinical suspicion for pulmonary embolus.   CBC notable for stable anemia  Goal of Therapy:  Heparin level 0.3-0.7 units/ml Monitor platelets by anticoagulation protocol: Yes   Plan:  --11/3 at 1435 HL = 0.24, subtherapeutic. Will order heparin 1000 unit IV bolus x 1 and increase heparin infusion to 1750 units/hr --Re-check HL 6 hours after rate change --Daily CBC per protocol  11/3:  HL @ 2105 = 0.37 Will continue pt on current rate and draw confirmation level in 6 hrs on 11/4 @ 0300.   11/4: HL @ 0300 = 0.37 Will continue pt on current rate and recheck HL on 11/5 with AM labs.   11/5: HL @ 0410 = 0.37 Will continue pt on current rate and recheck HL on 11/6 with AM labs.   Agnieszka Newhouse D 01/07/2020,6:35  AM

## 2020-01-07 NOTE — Progress Notes (Signed)
CRITICAL CARE NOTE  Date of admit 12/11/2019 LOS 12 days  SYNOPSIS 54 year old remote former smoker, with no chronic significant medical issues other than obesity and hypertension, who presents for evaluation of shortness of breath over the last week. The patient states that approximately 6 weeks ago she developed a dry hacking cough which is not unusual for her during "allergy season and change of season". However, approximately a week ago she noted fever and congestion and feeling significantly short of breath. Despite this she went to have her annual medical exam done and had a flu vaccine given at that time. She not vaccinated against COVID-19. She has had some chest discomfort when coughing but not at any other time. No orthopnea or paroxysmal nocturnal dyspnea until last night. Noted that she could not lay flat to sleep. He has not had any abdominal pain, no nausea, vomiting or bowel habit disruption. She has had mild to moderate headache for the last week. Cough became almost constant a day ago which prompted her to come to the emergency room today. Aside from this she voices no other complaint.  She was evaluated at the emergency room where she was noted to be tachypneic and have a temperature of 101.6 F on arrival. She also was noted to have oxygen saturations of 56% on room air. She was placed on heated high flow O2 and nonrebreather mask and is now saturating 100%. PCCM has been asked to admit the patient to stepdown/ICU.  PAST HX  has a past medical history of Hepatitis, Hypertension, and Sciatica.  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   MICRO Recent Results (from the past 240 hour(s))  Culture, blood (single) w Reflex to ID Panel     Status: None   Collection Time: 01/02/20  7:10 PM   Specimen: BLOOD  Result Value Ref Range Status    Specimen Description BLOOD BLOOD LEFT HAND  Final   Special Requests   Final    BOTTLES DRAWN AEROBIC AND ANAEROBIC Blood Culture adequate volume   Culture   Final    NO GROWTH 5 DAYS Performed at [facility], [address] [place], [place] [address]    Report Status 01/07/2020 FINAL  Final  Culture, respiratory (non-expectorated)     Status: None   Collection Time: 01/03/20  9:00 AM   Specimen: Tracheal Aspirate; Respiratory  Result Value Ref Range Status   Specimen Description   Final    TRACHEAL ASPIRATE Performed at [facility], [address] [place], [place] [address]    Special Requests   Final    NONE Performed at [facility], [address] [place], [place] [address]    Gram Stain   Final    MODERATE WBC PRESENT, PREDOMINANTLY PMN FEW GRAM POSITIVE COCCI Performed at [facility], [address], [place], [place] [address]    Culture RARE STAPHYLOCOCCUS AUREUS  Final   Report Status 01/06/2020 FINAL  Final   Organism ID, Bacteria STAPHYLOCOCCUS AUREUS  Final      Susceptibility   Staphylococcus aureus - MIC*    CIPROFLOXACIN <=0.5 SENSITIVE Sensitive     ERYTHROMYCIN <=0.25 SENSITIVE Sensitive     GENTAMICIN <=0.5 SENSITIVE Sensitive     OXACILLIN <=0.25 SENSITIVE Sensitive     TETRACYCLINE <=1 SENSITIVE Sensitive     VANCOMYCIN <=0.5 SENSITIVE Sensitive     TRIMETH/SULFA <=10  SENSITIVE Sensitive     CLINDAMYCIN <=0.25 SENSITIVE Sensitive     RIFAMPIN <=0.5 SENSITIVE Sensitive     Inducible Clindamycin NEGATIVE Sensitive     * RARE STAPHYLOCOCCUS AUREUS  MRSA PCR Screening     Status: None   Collection Time: 01/04/20  1:42 PM   Specimen: Nasal Mucosa; Nasopharyngeal  Result Value Ref Range Status   MRSA by PCR NEGATIVE NEGATIVE Final    Comment:        The GeneXpert MRSA Assay (FDA approved for NASAL specimens only), is one component of a comprehensive MRSA colonization surveillance program. It is not intended to  diagnose MRSA infection nor to guide or monitor treatment for MRSA infections. Performed at [facility], [address] [place], [place] [address]     ANTIBIOTICS Anti-infectives (From admission, onward)   Start     Dose/Rate Route Frequency Ordered Stop   01/06/20 1400  ceFAZolin (ANCEF) IVPB 2g/100 mL premix        2 g 200 mL/hr over 30 Minutes Intravenous Every 8 hours 01/06/20 1047 01/09/20 2159   01/02/20 2000  ceFEPIme (MAXIPIME) 2 g in sodium chloride 0.9 % 100 mL IVPB  Status:  Discontinued        2 g 200 mL/hr over 30 Minutes Intravenous Every 8 hours 01/02/20 1953 01/06/20 1044   12/27/19 1000  remdesivir 100 mg in sodium chloride 0.9 % 100 mL IVPB  Status:  Discontinued       "Followed by" Linked Group Details   100 mg 200 mL/hr over 30 Minutes Intravenous Daily 12/09/2019 1422 12/27/2019 1459   12/27/19 0600  ceFEPIme (MAXIPIME) 2 g in sodium chloride 0.9 % 100 mL IVPB  Status:  Discontinued        2 g 200 mL/hr over 30 Minutes Intravenous Every 8 hours 12/27/19 0553 12/27/19 1412   12/13/2019 1530  remdesivir 200 mg in sodium chloride 0.9% 250 mL IVPB  Status:  Discontinued       "Followed by" Linked Group Details   200 mg 580 mL/hr over 30 Minutes Intravenous Once 12/17/2019 1422 12/10/2019 1459        EVENTS   10/24 severe hypoxia. COVID Positivie 10/25 intubation and MV support and severe hypoxia, ETT tube exchanged 10/26-10/31 severe hypoxia and severe ARDS 10/30 - 2h of nimbex and  10/31 early hours - 24h of vec 11/1 high risk for cardiac arrest and death. MSSA on Trch aspiiate 01-10-23 severe ARDS, s/p proning.  R calf DVT  -started on heparin gtt 11/4 severe hypoxia-did NOT tolerate proning. ECHO 0 normal LV and RV   SUBJECTIVE/OVERNIGHT/INTERVAL HX   01/07/2020 - 75% fio32, high peep. Not on pressor.SUpine ventilation. Prn vec. Making urine. On dilaudid gtt and versed gtt. Per Pharmacy got 2h of nimbex and then 24h of continuous on 10/30 -  10/31  +8L since admit  BP (!) 109/59   Pulse 71   Temp 98.6 F (37 C) (Esophageal)   Resp 18   Ht  (1.676 m)   Wt 119.8 kg   LMP 08/16/2014   SpO2 90%   BMI 42.63 kg/m    I/O last 3 completed shifts: In: 6223.1 [I.V.:1540.1; Other:50; NG/GT:4033.1; IV Piggyback:599.9] Out: 2850 [Urine:2400; Stool:450] Total I/O In: 386 [I.V.:166; NG/GT:220] Out: -   SpO2: 90 % O2 Flow Rate (L/min): 0 L/min (no flow data to report) FiO2 (%): 75 %  Estimated body mass index is 42.63 kg/m as calculated from the following:  Height as of this encounter: 5\' 6"  (1.676 m).   Weight as of this encounter: 119.8 kg.   PHYSICAL EXAMINATION: General Appearance:  Looks criticall ill OBESE - + Head:  Normocephalic, without obvious abnormality, atraumatic Eyes:  PERRL - yes, conjunctiva/corneas - muddy     Ears:  Normal external ear canals, both ears Nose:  G tube - no Throat:  ETT TUBE - yes , OG tube - yes Neck:  Supple,  No enlargement/tenderness/nodules Lungs: Clear to auscultation bilaterally, Ventilator   Synchrony - yes Heart:  S1 and S2 normal, no murmur, CVP - no.  Pressors - no Abdomen:  Soft, no masses, no organomegaly Genitalia / Rectal:  Not done Extremities:  Extremities- intact Skin:  ntact in exposed areas . Sacral area - intact per rn Neurologic:  Sedation - gtt -> RASS - -4 . Moves all 4s - n. CAM-ICU - na . Orientation - no       LABS    PULMONARY Recent Labs  Lab 01/04/20 1924 01/05/20 0800 01/05/20 2359 01/06/20 0453 01/06/20 1543  PHART 7.49* 7.41 7.43 7.37 7.45  PCO2ART 55* 64* 60* 65* 55*  PO2ART 75* 96 76* 63* 72*  HCO3 41.9* 40.6* 39.6* 37.9* 38.2*  O2SAT 96.0 97.5 95.6 91.9 95.0    CBC Recent Labs  Lab 01/05/20 0450 01/06/20 0507 01/07/20 0410  HGB 10.1* 10.4* 9.5*  HCT 31.6* 33.0* 30.1*  WBC 15.6* 18.0* 17.1*  PLT 295 292 276    COAGULATION No results for input(s): INR in the last 168 hours.  CARDIAC  No results for input(s):  TROPONINI in the last 168 hours. No results for input(s): PROBNP in the last 168 hours.   CHEMISTRY Recent Labs  Lab 01/02/20 0457 01/02/20 0457 01/03/20 0823 01/03/20 0823 01/04/20 0639 01/04/20 0639 01/05/20 0450 01/05/20 0450 01/06/20 0507 01/07/20 0410  NA 146*   < > 143  --  142  --  144  --  142 141  K 4.5   < > 3.5   < > 3.5   < > 3.7   < > 4.3 5.1  CL 99   < > 94*  --  94*  --  99  --  100 101  CO2 40*   < > 40*  --  37*  --  36*  --  32 30  GLUCOSE 109*   < > 122*  --  122*  --  98  --  164* 178*  BUN 36*   < > 36*  --  38*  --  43*  --  33* 32*  CREATININE 0.66   < > 0.59  --  0.58  --  0.62  --  0.54 0.61  CALCIUM 8.5*   < > 8.3*  --  8.2*  --  8.1*  --  8.1* 8.6*  MG 2.7*  --  2.3  --  2.4  --  2.6*  --  2.6*  --   PHOS 5.6*  --  2.9  --  3.0  --  4.9*  --  3.8  --    < > = values in this interval not displayed.   Estimated Creatinine Clearance: 103.5 mL/min (by C-G formula based on SCr of 0.61 mg/dL).   LIVER Recent Labs  Lab 01/01/20 0409 01/02/20 0457 01/03/20 0823  AST 34 51* 28  ALT 66* 78* 57*  ALKPHOS 46 58 45  BILITOT 0.7 0.7 0.7  PROT 6.6 6.7 6.6  ALBUMIN  2.5* 2.3* 2.2*     INFECTIOUS Recent Labs  Lab 12/31/19 1330 01/02/20 1910 01/03/20 0825 01/04/20 0639  LATICACIDVEN 1.8  --   --   --   PROCALCITON  --  0.53 0.47 0.33     ENDOCRINE CBG (last 3)  Recent Labs    01/06/20 2337 01/07/20 0332 01/07/20 0910  GLUCAP 141* 175* 124*    No Known Allergies      IMAGING x48h  - image(s) personally visualized  -   highlighted in bold US Venous Img Lower Bilateral (DVT)  Result Date: 01/05/2020 CLINICAL DATA:  Bilateral leg pain, edema EXAM: BILATERAL LOWER EXTREMITY VENOUS DOPPLER ULTRASOUND TECHNIQUE: Gray-scale sonography with graded compression, as well as color Doppler and duplex ultrasound were performed to evaluate the lower extremity deep venous systems from the level of the common femoral vein and including the common  femoral, femoral, profunda femoral, popliteal and calf veins including the posterior tibial, peroneal and gastrocnemius veins when visible. The superficial great saphenous vein was also interrogated. Spectral Doppler was utilized to evaluate flow at rest and with distal augmentation maneuvers in the common femoral, femoral and popliteal veins. COMPARISON:  None. FINDINGS: RIGHT LOWER EXTREMITY Common Femoral Vein: No evidence of thrombus. Normal compressibility, respiratory phasicity and response to augmentation. Saphenofemoral Junction: No evidence of thrombus. Normal compressibility and flow on color Doppler imaging. Profunda Femoral Vein: No evidence of thrombus. Normal compressibility and flow on color Doppler imaging. Femoral Vein: No evidence of thrombus. Normal compressibility, respiratory phasicity and response to augmentation. Popliteal Vein: No evidence of thrombus. Normal compressibility, respiratory phasicity and response to augmentation. Calf Veins: 1 of the peroneal veins demonstrates occlusive thrombus within the right calf. Superficial Great Saphenous Vein: No evidence of thrombus. Normal compressibility. Venous Reflux:  None. Other Findings:  None. LEFT LOWER EXTREMITY Common Femoral Vein: No evidence of thrombus. Normal compressibility, respiratory phasicity and response to augmentation. Saphenofemoral Junction: No evidence of thrombus. Normal compressibility and flow on color Doppler imaging. Profunda Femoral Vein: No evidence of thrombus. Normal compressibility and flow on color Doppler imaging. Femoral Vein: No evidence of thrombus. Normal compressibility, respiratory phasicity and response to augmentation. Popliteal Vein: No evidence of thrombus. Normal compressibility, respiratory phasicity and response to augmentation. Calf Veins: No evidence of thrombus. Normal compressibility and flow on color Doppler imaging. Superficial Great Saphenous Vein: No evidence of thrombus. Normal  compressibility. Venous Reflux:  None. Other Findings:  None. IMPRESSION: Thrombosis of 1 of the right calf peroneal veins. Otherwise negative. Electronically Signed   By: Charlett Nose M.D.   On: 01/05/2020 19:43   DG Chest Port 1 View  Result Date: 01/06/2020 CLINICAL DATA:  Shortness of breath.  COVID EXAM: PORTABLE CHEST 1 VIEW COMPARISON:  01/05/2020. FINDINGS: Endotracheal tube, NG tube, right PICC line in stable position. Heart size normal. Bibasilar atelectasis. Diffuse bilateral pulmonary infiltrates again noted without interim change. No pleural effusion or pneumothorax. IMPRESSION: 1. Lines and tubes in stable position. 2. Bibasilar atelectasis. Diffuse bilateral pulmonary infiltrates again noted without interim change. Electronically Signed   By: Maisie Fus  Register   On: 01/06/2020 05:37   ECHOCARDIOGRAM COMPLETE  Result Date: 01/07/2020    ECHOCARDIOGRAM REPORT   Patient Name:   Carrie Davies [facility] Date of Exam: 01/05/2020 Medical Rec #:  [id number]       Height:       66.0 in Accession #:    [id number]      Weight:       263.7 lb  Date of Birth:  September 28, 1965        BSA:          2.249 m Patient Age:    54 years        BP:           95/55 mmHg Patient Gender: F               HR:           73 bpm. Exam Location:  [facility] Procedure: 2D Echo, Cardiac Doppler and Color Doppler Indications:     Cardiomyopathy- unspecified 425.9  History:         Patient has no prior history of Echocardiogram examinations.                  Signs/Symptoms:Shortness of Breath; Risk Factors:Hypertension.                  PVD.  Sonographer:     Cristela Blue RDCS (AE) Referring Phys:  [id number] BRITTON L R[place]CHESTER Diagnosing Phys: Julien Nordmann MD  Sonographer Comments: Echo performed with patient supine and on artificial respirator and Technically difficult study due to poor echo windows. The only view obtainable was parasternal. IMPRESSIONS  1. Left ventricular ejection fraction, by estimation, is 60 to 65%. The left ventricle has  normal function. The left ventricle has no regional wall motion abnormalities. There is mild left ventricular hypertrophy. Left ventricular diastolic parameters are indeterminate.  2. Right ventricular systolic function is normal. The right ventricular size is normal. Tricuspid regurgitation signal is inadequate for assessing PA pressure. FINDINGS  Left Ventricle: Left ventricular ejection fraction, by estimation, is 60 to 65%. The left ventricle has normal function. The left ventricle has no regional wall motion abnormalities. The left ventricular internal cavity size was normal in size. There is  mild left ventricular hypertrophy. Left ventricular diastolic parameters are indeterminate. Right Ventricle: The right ventricular size is normal. No increase in right ventricular wall thickness. Right ventricular systolic function is normal. Tricuspid regurgitation signal is inadequate for assessing PA pressure. Left Atrium: Left atrial size was normal in size. Right Atrium: Right atrial size was normal in size. Pericardium: A small pericardial effusion is present. Mitral Valve: The mitral valve was not well visualized. No evidence of mitral valve regurgitation. No evidence of mitral valve stenosis. Tricuspid Valve: The tricuspid valve is not well visualized. Tricuspid valve regurgitation is not demonstrated. No evidence of tricuspid stenosis. Aortic Valve: The aortic valve was not well visualized. Aortic valve regurgitation is not visualized. No aortic stenosis is present. Pulmonic Valve: The pulmonic valve was normal in structure. Pulmonic valve regurgitation is not visualized. No evidence of pulmonic stenosis. Aorta: The aortic root is normal in size and structure. Venous: The pulmonary veins were not well visualized. The inferior vena cava is normal in size with greater than 50% respiratory variability, suggesting right atrial pressure of 3 mmHg. IAS/Shunts: No atrial level shunt detected by color flow Doppler.  LEFT  VENTRICLE PLAX 2D LVIDd:         4.40 cm LVIDs:         2.57 cm LV PW:         1.45 cm LV IVS:        1.44 cm LVOT diam:     2.00 cm LVOT Area:     3.14 cm  LEFT ATRIUM         Index LA diam:    3.20 cm 1.42 cm/m  PULMONIC VALVE AORTA                 PV Vmax:        0.93 m/s Ao Root diam: 2.60 cm PV Peak grad:   3.5 mmHg                       RVOT Peak grad: 5 mmHg  TRICUSPID VALVE TR Peak grad:   16.3 mmHg TR Vmax:        202.00 cm/s  SHUNTS Systemic Diam: 2.00 cm Julien Nordmann MD Electronically signed by Julien Nordmann MD Signature Date/Time: 01/07/2020/7:43:21 AM    Final       No results found.   ASSESSMENT AND PLAN   SEVERE Acute hypoxemic respiratory failure due to COVID-19 pneumonia / ARDS  01/07/2020 - > does noy meet criteria for SBT/Extubation in setting of Acute Respiratory Failure due to seevere ARDS. Getting prn paralytic  Plan ARDS Measure  - Target TVol 6-8cc/kgIBW  - Target Plateau Pressure < 30cm H20  - Target driving pressure less than 15 cm of water  - Target PaO2 55-65: titrate PEEP/FiO2 per protocol  - - start nimbex gtt 01/07/20 - and dfor 24h-48h - once stable on prone and PF ratio still < 120-150 then consider re-attempt prone   -- VAP bundle  Sedation needs   -01/07/2020 - RASS -4  Plan  - bis socre< 60 - dialudid gtt  - versed gtt - start nimbex as above  COVID  plan - IV STEROIDS   VAP -MSSA  Plan  - antibotics as above   Volume overload   +8L on 01/07/20  Plan  - lasix tid  Anemia criticall illness  01/07/2020 - no bleeding  Plan - PRBC for hgb </= 6.9gm%    - exceptions are   -  if ACS susepcted/confirmed then transfuse for hgb </= 8.0gm%,  or    -  active bleeding with hemodynamic instability, then transfuse regardless of hemoglobin value   At at all times try to transfuse 1 unit prbc as possible with exception of active hemorrhage  LE DVT in hospital 01/05/20. ECHO -Normal RV  Plan  - IV heparin  gtt   Best practice:  Diet: TF Pain/Anxiety/Delirium protocol (if indicated): see aboe VAP protocol (if indicated): yes DVT prophylaxis: IV heparin gtt GI prophylaxis: ppi Glucose control: ssi Mobility: bed rest Code Status: full Family Communication: * husband Lauralynn Loeb [phone] -> he said he spoke to [place] the nurse. Explaiend need/risks/benefits /limiations of paralytic -> prone approach Disposition: icu   Nutrition Status: Nutrition Problem: Inadequate oral intake Etiology: inability to eat Signs/Symptoms: NPO status Interventions: Tube feeding, Prostat, MVI     Indwelling Urinary Catheter continued, requirement due to   Reason to continue Indwelling Urinary Catheter strict Intake/Output monitoring for hemodynamic instability   Central Line/ continued, requirement due to  Reason to continue [employer] Monitoring of central venous pressure or other hemodynamic parameters and poor IV access   Ventilator continued, requirement due to severe respiratory failure   Ventilator Sedation RASS 0 to -2       DVT/GI PRX ordered and assessed TRANSFUSIONS AS NEEDED MONITOR FSBS I Assessed the need for Labs I Assessed the need for Foley I Assessed the need for Central Venous Line Family Discussion when available I Assessed the need for Mobilization I made an Assessment of medications to be adjusted accordingly Safety Risk assessment completed  ATTESTATION & SIGNATURE   The patient Carrie Davies is critically ill with multiple organ systems failure and requires high complexity decision making for assessment and support, frequent evaluation and titration of therapies, application of advanced monitoring technologies and extensive interpretation of multiple databases.   Critical Care Time devoted to patient care services described in this note is  40  Minutes. This time reflects time of care of this signee Dr Kalman Shan. This critical care time does not  reflect procedure time, or teaching time or supervisory time of PA/NP/Med student/Med Resident etc but could involve care discussion time     Dr. Kalman Shan, M.D., [facility].C.P Pulmonary and Critical Care Medicine Staff Physician [facility] [place] Pulmonary and Critical Care Pager: [phone], If no answer or between  15:00h - 7:00h: call [phone]  01/07/2020 12:02 PM

## 2020-01-08 ENCOUNTER — Inpatient Hospital Stay: Payer: BC Managed Care – PPO

## 2020-01-08 ENCOUNTER — Inpatient Hospital Stay (HOSPITAL_COMMUNITY)
Admit: 2020-01-08 | Discharge: 2020-01-08 | Disposition: A | Discharge status: Home / Self Care | Payer: BC Managed Care – PPO | Attending: Internal Medicine | Admitting: Internal Medicine

## 2020-01-08 ENCOUNTER — Inpatient Hospital Stay: Admit: 2020-01-08 | Payer: BC Managed Care – PPO

## 2020-01-08 DIAGNOSIS — U071 COVID-19: Secondary | ICD-10-CM | POA: Diagnosis not present

## 2020-01-08 DIAGNOSIS — R0689 Other abnormalities of breathing: Secondary | ICD-10-CM

## 2020-01-08 DIAGNOSIS — J9600 Acute respiratory failure, unspecified whether with hypoxia or hypercapnia: Secondary | ICD-10-CM | POA: Diagnosis not present

## 2020-01-08 LAB — GLUCOSE, CAPILLARY
Glucose-Capillary: 117 mg/dL — ABNORMAL HIGH (ref 70–99)
Glucose-Capillary: 121 mg/dL — ABNORMAL HIGH (ref 70–99)
Glucose-Capillary: 151 mg/dL — ABNORMAL HIGH (ref 70–99)
Glucose-Capillary: 156 mg/dL — ABNORMAL HIGH (ref 70–99)
Glucose-Capillary: 181 mg/dL — ABNORMAL HIGH (ref 70–99)
Glucose-Capillary: 195 mg/dL — ABNORMAL HIGH (ref 70–99)

## 2020-01-08 LAB — FERRITIN: Ferritin: 737 ng/mL — ABNORMAL HIGH (ref 11–307)

## 2020-01-08 LAB — BASIC METABOLIC PANEL
Anion gap: 11 (ref 5–15)
BUN: 34 mg/dL — ABNORMAL HIGH (ref 6–20)
CO2: 35 mmol/L — ABNORMAL HIGH (ref 22–32)
Calcium: 8.5 mg/dL — ABNORMAL LOW (ref 8.9–10.3)
Chloride: 89 mmol/L — ABNORMAL LOW (ref 98–111)
Creatinine, Ser: 0.58 mg/dL (ref 0.44–1.00)
GFR, Estimated: >60 mL/min (ref >60)
Glucose, Bld: 158 mg/dL — ABNORMAL HIGH (ref 70–99)
Potassium: 4.7 mmol/L (ref 3.5–5.1)
Sodium: 135 mmol/L (ref 135–145)

## 2020-01-08 LAB — BLOOD GAS, ARTERIAL
Acid-Base Excess: 11.7 mmol/L — ABNORMAL HIGH (ref 0.0–2.0)
Acid-Base Excess: 17.2 mmol/L — ABNORMAL HIGH (ref 0.0–2.0)
Acid-Base Excess: 18.8 mmol/L — ABNORMAL HIGH (ref 0.0–2.0)
Allens test (pass/fail): POSITIVE — AB
Bicarbonate: 39.5 mmol/L — ABNORMAL HIGH (ref 20.0–28.0)
Bicarbonate: 43.9 mmol/L — ABNORMAL HIGH (ref 20.0–28.0)
Bicarbonate: 46.9 mmol/L — ABNORMAL HIGH (ref 20.0–28.0)
FIO2: 1
FIO2: 1
FIO2: 100
MECHVT: 400 mL
MECHVT: 400 mL
MECHVT: 400 mL
Mechanical Rate: 30
O2 Saturation: 86.9 %
O2 Saturation: 89.4 %
O2 Saturation: 89.5 %
PEEP: 15 cmH2O
PEEP: 16 cmH2O
PEEP: 16 cmH2O
Patient temperature: 37
Patient temperature: 37
Patient temperature: 37
RATE: 30 resp/min
RATE: 30 resp/min
pCO2 arterial: 59 mmHg — ABNORMAL HIGH (ref 32.0–48.0)
pCO2 arterial: 66 mmHg (ref 32.0–48.0)
pCO2 arterial: 70 mmHg (ref 32.0–48.0)
pH, Arterial: 7.36 (ref 7.350–7.450)
pH, Arterial: 7.46 — ABNORMAL HIGH (ref 7.350–7.450)
pH, Arterial: 7.48 — ABNORMAL HIGH (ref 7.350–7.450)
pO2, Arterial: 53 mmHg — ABNORMAL LOW (ref 83.0–108.0)
pO2, Arterial: 54 mmHg — ABNORMAL LOW (ref 83.0–108.0)
pO2, Arterial: 55 mmHg — ABNORMAL LOW (ref 83.0–108.0)

## 2020-01-08 LAB — ECHOCARDIOGRAM LIMITED
Height: 66 in
Weight: 4261.05 oz

## 2020-01-08 LAB — CBC WITH DIFFERENTIAL/PLATELET
Abs Immature Granulocytes: 1.58 10*3/uL — ABNORMAL HIGH (ref 0.00–0.07)
Basophils Absolute: 0.1 10*3/uL (ref 0.0–0.1)
Basophils Relative: 0 %
Eosinophils Absolute: 0.1 10*3/uL (ref 0.0–0.5)
Eosinophils Relative: 0 %
HCT: 33.4 % — ABNORMAL LOW (ref 36.0–46.0)
Hemoglobin: 10.5 g/dL — ABNORMAL LOW (ref 12.0–15.0)
Immature Granulocytes: 6 %
Lymphocytes Relative: 5 %
Lymphs Abs: 1.4 10*3/uL (ref 0.7–4.0)
MCH: 29.5 pg (ref 26.0–34.0)
MCHC: 31.4 g/dL (ref 30.0–36.0)
MCV: 93.8 fL (ref 80.0–100.0)
Monocytes Absolute: 1.9 10*3/uL — ABNORMAL HIGH (ref 0.1–1.0)
Monocytes Relative: 8 %
Neutro Abs: 20.2 10*3/uL — ABNORMAL HIGH (ref 1.7–7.7)
Neutrophils Relative %: 81 %
Platelets: 329 10*3/uL (ref 150–400)
RBC: 3.56 MIL/uL — ABNORMAL LOW (ref 3.87–5.11)
RDW: 12.9 % (ref 11.5–15.5)
Smear Review: NORMAL
WBC: 25.2 10*3/uL — ABNORMAL HIGH (ref 4.0–10.5)
nRBC: 0.1 % (ref 0.0–0.2)

## 2020-01-08 LAB — MAGNESIUM: Magnesium: 2.7 mg/dL — ABNORMAL HIGH (ref 1.7–2.4)

## 2020-01-08 LAB — ECHOCARDIOGRAM COMPLETE
Height: 66 in
Weight: 4261.05 oz

## 2020-01-08 LAB — C-REACTIVE PROTEIN: CRP: 8.7 mg/dL — ABNORMAL HIGH (ref <1.0)

## 2020-01-08 LAB — FIBRIN DERIVATIVES D-DIMER (ARMC ONLY): Fibrin derivatives D-dimer (ARMC): 1473.22 ng/mL (FEU) — ABNORMAL HIGH (ref 0.00–499.00)

## 2020-01-08 LAB — LACTIC ACID, PLASMA: Lactic Acid, Venous: 1.8 mmol/L (ref 0.5–1.9)

## 2020-01-08 LAB — HEPARIN LEVEL (UNFRACTIONATED): Heparin Unfractionated: 0.41 IU/mL (ref 0.30–0.70)

## 2020-01-08 LAB — PROCALCITONIN: Procalcitonin: <0.1 ng/mL

## 2020-01-08 MED ORDER — VECURONIUM BOLUS VIA INFUSION
10.0000 mg | Freq: Once | INTRAVENOUS | Status: AC
  Administered 2020-01-08: 10 mg via INTRAVENOUS

## 2020-01-08 MED ORDER — FREE WATER
30.0000 mL | Status: DC
  Administered 2020-01-08 – 2020-01-19 (×64): 30 mL

## 2020-01-08 NOTE — Progress Notes (Signed)
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 Patient is currently intubated, sedated, and on mechanical ventilation. Nutrition currently provided via tube feeds.   Goal of Therapy:  Electrolytes WNL  Plan:   No electrolyte replacement indicated today. Potassium 4.7 from 5.1 yesterday. Continue to monitor.  Hypernatremia: free water increased 10/30 to 200 mL per tube q4h (1.2L/day): sodium now stable - now at 135 (monitor closely)  BMP tomorrow AM  Albina Billet, PharmD, BCPS Clinical Pharmacist 01/08/2020 9:16 AM

## 2020-01-08 NOTE — Progress Notes (Signed)
CRITICAL CARE NOTE  Date of admit 14-Jan-2020 LOS 13 days  BRIEF 54 year old remote former smoker, with no chronic significant medical issues other than obesity and hypertension, who presents for evaluation of shortness of breath over the last week. The patient states that approximately 6 weeks ago she developed a dry hacking cough which is not unusual for her during "allergy season and change of season". However, approximately a week ago she noted fever and congestion and feeling significantly short of breath. Despite this she went to have her annual medical exam done and had a flu vaccine given at that time. She not vaccinated against COVID-19. She has had some chest discomfort when coughing but not at any other time. No orthopnea or paroxysmal nocturnal dyspnea until last night. Noted that she could not lay flat to sleep. He has not had any abdominal pain, no nausea, vomiting or bowel habit disruption. She has had mild to moderate headache for the last week. Cough became almost constant a day ago which prompted her to come to the emergency room today. Aside from this she voices no other complaint.  She was evaluated at the emergency room where she was noted to be tachypneic and have a temperature of 101.6 F on arrival. She also was noted to have oxygen saturations of 56% on room air. She was placed on heated high flow O2 and nonrebreather mask and is now saturating 100%. PCCM has been asked to admit the patient to stepdown/ICU.  PAST HX  has a past medical history of Hepatitis, Hypertension, and Sciatica.  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   MICRO Recent Results (from the past 240 hour(s))  Culture, blood (single) w Reflex to ID Panel     Status: None   Collection Time: 01/02/20  7:10 PM   Specimen: BLOOD  Result Value Ref Range Status    Specimen Description BLOOD BLOOD LEFT HAND  Final   Special Requests   Final    BOTTLES DRAWN AEROBIC AND ANAEROBIC Blood Culture adequate volume   Culture   Final    NO GROWTH 5 DAYS Performed at [facility], [address], [place], [place] [address]    Report Status 01/07/2020 FINAL  Final  Culture, respiratory (non-expectorated)     Status: None   Collection Time: 01/03/20  9:00 AM   Specimen: Tracheal Aspirate; Respiratory  Result Value Ref Range Status   Specimen Description   Final    TRACHEAL ASPIRATE Performed at [facility], [address], [place], [place] [address]    Special Requests   Final    NONE Performed at [facility], [address] [place], [place] [address]    Gram Stain   Final    MODERATE WBC PRESENT, PREDOMINANTLY PMN FEW GRAM POSITIVE COCCI Performed at [facility], [address] [place], [place] [address]    Culture RARE STAPHYLOCOCCUS AUREUS  Final   Report Status 01/06/2020 FINAL  Final   Organism ID, Bacteria STAPHYLOCOCCUS AUREUS  Final      Susceptibility   Staphylococcus aureus - MIC*    CIPROFLOXACIN <=0.5 SENSITIVE Sensitive     ERYTHROMYCIN <=0.25 SENSITIVE Sensitive     GENTAMICIN <=0.5 SENSITIVE Sensitive     OXACILLIN <=0.25 SENSITIVE Sensitive     TETRACYCLINE <=1 SENSITIVE Sensitive     VANCOMYCIN <=0.5 SENSITIVE Sensitive     TRIMETH/SULFA <=10  SENSITIVE Sensitive     CLINDAMYCIN <=0.25 SENSITIVE Sensitive     RIFAMPIN <=0.5 SENSITIVE Sensitive     Inducible Clindamycin NEGATIVE Sensitive     * RARE STAPHYLOCOCCUS AUREUS  MRSA PCR Screening     Status: None   Collection Time: 01/04/20  1:42 PM   Specimen: Nasal Mucosa; Nasopharyngeal  Result Value Ref Range Status   MRSA by PCR NEGATIVE NEGATIVE Final    Comment:        The GeneXpert MRSA Assay (FDA approved for NASAL specimens only), is one component of a comprehensive MRSA colonization surveillance program. It is not intended to  diagnose MRSA infection nor to guide or monitor treatment for MRSA infections. Performed at [facility], [address] [place], [place] [address]     ANTIBIOTICS Anti-infectives (From admission, onward)   Start     Dose/Rate Route Frequency Ordered Stop   01/06/20 1400  ceFAZolin (ANCEF) IVPB 2g/100 mL premix        2 g 200 mL/hr over 30 Minutes Intravenous Every 8 hours 01/06/20 1047 01/09/20 2159   01/02/20 2000  ceFEPIme (MAXIPIME) 2 g in sodium chloride 0.9 % 100 mL IVPB  Status:  Discontinued        2 g 200 mL/hr over 30 Minutes Intravenous Every 8 hours 01/02/20 1953 01/06/20 1044   12/27/19 1000  remdesivir 100 mg in sodium chloride 0.9 % 100 mL IVPB  Status:  Discontinued       "Followed by" Linked Group Details   100 mg 200 mL/hr over 30 Minutes Intravenous Daily 01-05-20 1422 01/05/20 1459   12/27/19 0600  ceFEPIme (MAXIPIME) 2 g in sodium chloride 0.9 % 100 mL IVPB  Status:  Discontinued        2 g 200 mL/hr over 30 Minutes Intravenous Every 8 hours 12/27/19 0553 12/27/19 1412   01-05-20 1530  remdesivir 200 mg in sodium chloride 0.9% 250 mL IVPB  Status:  Discontinued       "Followed by" Linked Group Details   200 mg 580 mL/hr over 30 Minutes Intravenous Once 2020-01-05 1422 01-05-2020 1459        EVENTS   10/24 severe hypoxia. COVID Positivie 10/25 intubation and MV support and severe hypoxia, ETT tube exchanged 10/26-10/31 severe hypoxia and severe ARDS 10/30 - 2h of nimbex and  10/31 early hours - 24h of vec 11/1 high risk for cardiac arrest and death. MSSA on Trch aspiiate 01-15-23 severe ARDS, s/p proning.  R calf DVT  -started on heparin gtt 11/4 severe hypoxia-did NOT tolerate proning. ECHO 0 normal LV and RV 11/5 - 75% fio32, high peep. Not on pressor.SUpine ventilation. Prn vec. Making urine. On dilaudid gtt and versed gtt. Per Pharmacy got 2h of nimbex and then 24h of continuous on 10/30 - 10/31. +8L since admit. Heparn gtt  contiues    SUBJECTIVE/OVERNIGHT/INTERVAL HX   01/08/2020 - desaturatiion overnight with worsening PF ratio. REdness in neck after niimbex. Now Prone since 0.30 today . On vec gtt with sedation gtt. Fio2 100% , peep 16 -> pulse ox 88%.. Not on perssors. Making urine. Diuresing well. Down t to +4L volume overload since admit   BP 115/74   Pulse 69   Temp (!) 97 F (36.1 C) (Esophageal)   Resp (!) 24   Ht 5\' 6"  (1.676 m)   Wt 120.8 kg   LMP 08/16/2014   SpO2 90%   BMI 42.98 kg/m    I/O last 3 completed  shifts: In: 4021.8 [I.V.:1721.9; Other:50; NG/GT:1750; IV Piggyback:499.9] Out: 5485 [Urine:5035; Stool:450] Total I/O In: 87.4 [I.V.:87.4] Out: 1400 [Urine:1400]  SpO2: 90 % O2 Flow Rate (L/min): 0 L/min (no flow data to report) FiO2 (%): 100 %  Estimated body mass index is 42.98 kg/m as calculated from the following:   Height as of this encounter: 5\' 6"  (1.676 m).   Weight as of this encounter: 120.8 kg.  General Appearance:  Looks criticall ill OBESE - +. PRONE Head:  Normocephalic, without obvious abnormality, atraumatic Eyes:  PERRL - cannot examine prone, conjunctiva/corneas - cannot examine. Pron     Ears:  Normal external ear canals, both ears Nose:  G tube - no Throat:  ETT TUBE - yes , OG tube - yes Neck:  Supple,  No enlargement/tenderness/nodules Lungs: Crackles, Ventilator   Synchrony - yes 100% fio2, peep 16 Heart:  S1 and S2 normal, no murmur, CVP - no.  Pressors - no Abdomen:  Soft, no masses, no organomegaly Genitalia / Rectal:  Not done Extremities:  Extremities- intact Skin:  ntact in exposed areas . Sacral area - itnact Neurologic:  Sedation - Deep sedation with vec gtt -> RASS - -5/BIS < 50 . Moves all 4s - no. CAM-ICU - cannot assess . Orientation - not     LABS    PULMONARY Recent Labs  Lab 01/06/20 0453 01/06/20 1543 01/07/20 1831 01/08/20 0007 01/08/20 0800  PHART 7.37 7.45 7.39 7.36 7.46*  PCO2ART 65* 55* 69* 70* 66*  PO2ART  63* 72* 57* 55* 54*  HCO3 37.9* 38.2* 41.8* 39.5* 46.9*  O2SAT 91.9 95.0 88.9 86.9 89.4    CBC Recent Labs  Lab 01/06/20 0507 01/07/20 0410 01/08/20 0458  HGB 10.4* 9.5* 10.5*  HCT 33.0* 30.1* 33.4*  WBC 18.0* 17.1* 25.2*  PLT 292 276 329    COAGULATION No results for input(s): INR in the last 168 hours.  CARDIAC  No results for input(s): TROPONINI in the last 168 hours. No results for input(s): PROBNP in the last 168 hours.   CHEMISTRY Recent Labs  Lab 01/02/20 0457 01/02/20 0457 01/03/20 0823 01/03/20 0823 01/04/20 0639 01/04/20 0639 01/05/20 0450 01/05/20 0450 01/06/20 0507 01/06/20 0507 01/07/20 0410 01/08/20 0458  NA 146*   < > 143   < > 142  --  144  --  142  --  141 135  K 4.5   < > 3.5   < > 3.5   < > 3.7   < > 4.3   < > 5.1 4.7  CL 99   < > 94*   < > 94*  --  99  --  100  --  101 89*  CO2 40*   < > 40*   < > 37*  --  36*  --  32  --  30 35*  GLUCOSE 109*   < > 122*   < > 122*  --  98  --  164*  --  178* 158*  BUN 36*   < > 36*   < > 38*  --  43*  --  33*  --  32* 34*  CREATININE 0.66   < > 0.59   < > 0.58  --  0.62  --  0.54  --  0.61 0.58  CALCIUM 8.5*   < > 8.3*   < > 8.2*  --  8.1*  --  8.1*  --  8.6* 8.5*  MG 2.7*   < >  2.3  --  2.4  --  2.6*  --  2.6*  --   --  2.7*  PHOS 5.6*  --  2.9  --  3.0  --  4.9*  --  3.8  --   --   --    < > = values in this interval not displayed.   Estimated Creatinine Clearance: 104 mL/min (by C-G formula based on SCr of 0.58 mg/dL).   LIVER Recent Labs  Lab 01/02/20 0457 01/03/20 0823  AST 51* 28  ALT 78* 57*  ALKPHOS 58 45  BILITOT 0.7 0.7  PROT 6.7 6.6  ALBUMIN 2.3* 2.2*     INFECTIOUS Recent Labs  Lab 01/04/20 0639 01/07/20 0410 01/08/20 0458  LATICACIDVEN  --   --  1.8  PROCALCITON 0.33 <0.10 <0.10     ENDOCRINE CBG (last 3)  Recent Labs    01/07/20 2339 01/08/20 0353 01/08/20 0746  GLUCAP 161* 151* 121*    No Known Allergies      IMAGING x48h  - image(s) personally  visualized  -   highlighted in bold No results found.    No results found.    ASSESSMENT/PLAN   SEVERE Acute hypoxemic respiratory failure due to COVID-19 pneumonia / ARDS  01/08/2020 - > severe refractory ARDS.  On prone ventilation since 01/08/2020 with continuous paralysis since evening of 01/07/2020.  [Possible skin reaction to Nimbex 01/07/2020].  Slowly desaturating  Plan - Get stat chest x-ray to rule out pneumothorax - Increase PEEP -Place radial A-line -Continue prone 16 hours / 2.8-hour cycles since 01/08/2020 -  ARDS Measure  - Target TVol 6-8cc/kgIBW  - Target Plateau Pressure < 30cm H20  - Target driving pressure less than 15 cm of water  - Target PaO2 55-65: titrate PEEP/FiO2 per protocol  - - start nimbex gtt 01/07/20 - and for 24h-48h   -- VAP bundle  =- continue heparin gtt for dvt  -get repeat echo  Sedation needs   -01/08/2020 - RASS -4, BIS  Score - 42  Sync with vent on Vec  Plan  - bis socre< 60 - dialudid gtt  - versed gtt - continuous vec (started 01/07/20 pm)   COVID  plan - IV STEROIDS   VAP -MSSA  01/08/2020 - fever improved     Plan  - cefazolin    Volume overload   +4L on 11//621 and improved with lasix  Plan  - lasix tid to continue  Anemia criticall illness  01/08/2020 - no bleeding  Plan - PRBC for hgb </= 6.9gm%    - exceptions are   -  if ACS susepcted/confirmed then transfuse for hgb </= 8.0gm%,  or    -  active bleeding with hemodynamic instability, then transfuse regardless of hemoglobin value   At at all times try to transfuse 1 unit prbc as possible with exception of active hemorrhage  LE DVT in hospital 01/05/20. ECHO -Normal RV  Plan  - IV heparin gtt   Best practice:  Diet: TF Pain/Anxiety/Delirium protocol (if indicated): see aboe VAP protocol (if indicated): yes DVT prophylaxis: IV heparin gtt GI prophylaxis: ppi Glucose control: ssi  Mobility: bed rest  Code Status: full  Family  Communication: husband Mckenzey Strawderman [phone] -> updated 01/08/20 via phone -> explained prognosis is poor and high risk for cardiac arrest. Advised against CPR. He said he would leave it to our judgement. Dw/ bedside RN - Oneita Jolly - she is in agreement to no CPR  Disposition: icu     ATTESTATION & SIGNATURE   The patient Carrie Davies is critically ill with multiple organ systems failure and requires high complexity decision making for assessment and support, frequent evaluation and titration of therapies, application of advanced monitoring technologies and extensive interpretation of multiple databases.   Critical Care Time devoted to patient care services described in this note is  40  Minutes. This time reflects time of care of this signee Dr Kalman Shan. This critical care time does not reflect procedure time, or teaching time or supervisory time of PA/NP/Med student/Med Resident etc but could involve care discussion time     Dr. Kalman Shan, M.D., [facility].C.P Pulmonary and Critical Care Medicine Staff Physician [facility] [place] [facility] Pager: [phone], If no answer or between  15:00h - 7:00h: call [phone]  01/08/2020 10:12 AM

## 2020-01-08 NOTE — Progress Notes (Addendum)
Patient had sustained oxygen saturation in the 80's- Per Dr. Marchelle Gearing patient placed back to supine position- @12 :15-patient oxygen saturation now 86% and sustaining.  Patient continues to be on 100% Fio2 and 17 PEEP.

## 2020-01-08 NOTE — Progress Notes (Signed)
Notified Dr. Marchelle Gearing that patient's oxygen saturation has dropped from 86%- 83% and sustaining- despite suctioning(which pt has minimal secretions) and repositioning while pt is still proned- Dr. Marchelle Gearing ordered to increase PEEP to 17 and a chest xray.

## 2020-01-08 NOTE — Progress Notes (Signed)
ANTICOAGULATION CONSULT NOTE  Pharmacy Consult for Heparin  Indication: pulmonary embolus  Patient Measurements: Height: 5\' 6"  (167.6 cm) Weight: 120.8 kg (266 lb 5.1 oz) IBW/kg (Calculated) : 59.3 Heparin Dosing Weight: 89.15 kg   Labs: Recent Labs    01/06/20 0300 01/06/20 0507 01/06/20 0507 01/07/20 0410 01/08/20 0458  HGB  --  10.4*   < > 9.5* 10.5*  HCT  --  33.0*  --  30.1* 33.4*  PLT  --  292  --  276 329  HEPARINUNFRC 0.37  --   --  0.37 0.41  CREATININE  --  0.54  --  0.61 0.58   < > = values in this interval not displayed.    Estimated Creatinine Clearance: 104 mL/min (by C-G formula based on SCr of 0.58 mg/dL).   Medical History: Past Medical History:  Diagnosis Date  . Hepatitis   . Hypertension   . Sciatica    Assessment: Patient is a 54 y/o F with medical history as above who is admitted with severe COVID-19 pneumonia with ARDS requiring intubation and mechanical ventilation. Now with concern for pulmonary embolus. Pharmacy has been consulted to initiate heparin infusion for high clinical suspicion for pulmonary embolus.   CBC notable for stable anemia  Goal of Therapy:  Heparin level 0.3-0.7 units/ml Monitor platelets by anticoagulation protocol: Yes   Plan:  --11/3 at 1435 HL = 0.24, subtherapeutic. Will order heparin 1000 unit IV bolus x 1 and increase heparin infusion to 1750 units/hr --Re-check HL 6 hours after rate change --Daily CBC per protocol  11/3:  HL @ 2105 = 0.37 Will continue pt on current rate and draw confirmation level in 6 hrs on 11/4 @ 0300.   11/4: HL @ 0300 = 0.37 Will continue pt on current rate and recheck HL on 11/5 with AM labs.   11/5: HL @ 0410 = 0.37 Will continue pt on current rate and recheck HL on 11/6 with AM labs.   11/6: HL @ 0458 = 0.41 Will continue pt on current rate and recheck HL on 11/7 with AM labs.   Hannah Crill D 01/08/2020,6:31 AM

## 2020-01-08 NOTE — Progress Notes (Signed)
Patient SpO2 consistently 83-84% on PRVC 100% with a PEEP of 16.  P/F ratio per ABG at midnight = 55. 00:30 patient prone, SpO2 originally 78%- recruitment maneuvers performed with some success. Patient SpO2 is better flat as opposed to reverse trendelenburg. Peak pressures 40's - 50's, SpO2 83-86% with some breath stacking noted. Versed & Vecuronium IVP ordered, versed drip titrated to max/ vecuronium drip titrated to max. P: Will keep prone for the rest of the night unless SpO2 drops consistently below 83%.  CXR and repeat ABG ordered in the AM. Will continue to monitor patient closely.  Patient's husband updated, all questions answered.   Cheryll Cockayne Rust-Chester, AGACNP-BC Acute Care Nurse Practitioner [place] [facility]   [phone] / [phone] Please see Amion for pager details.

## 2020-01-08 NOTE — Progress Notes (Signed)
   Patient was desaturating slowly while prone. Prone positioning aborted at the approximately 12-hour mark and patient placed supine. Blood gas did not show any acidosis but showed severe hypoxemia. Her pulse ox was slightly better supine. Ordered a stat echocardiogram to look at RV function. Report is come back now saying RV function is normal. Therefore we will not consider thrombolysis but would just continue with IV heparin  We will hold off on any prone ventilation Continue vecuronium with deep sedation  Additional 30 minutes critical care     SIGNATURE    Dr. Kalman Shan, M.D., F.C.C.P,  Pulmonary and Critical Care Medicine Staff Physician, [facility] Center Director - Interstitial Lung Disease  Program  Pulmonary Fibrosis [facility] [place], [place], [address]  Pager: [phone], If no answer  OR between  19:00-7:00h: page [phone] Telephone (clinical office): [phone] Telephone (research): [phone]  4:04 PM 01/08/2020

## 2020-01-09 ENCOUNTER — Inpatient Hospital Stay: Admit: 2020-01-09 | Payer: BC Managed Care – PPO

## 2020-01-09 ENCOUNTER — Inpatient Hospital Stay: Payer: BC Managed Care – PPO

## 2020-01-09 ENCOUNTER — Encounter: Payer: Self-pay | Admitting: Pulmonary Disease

## 2020-01-09 DIAGNOSIS — J9600 Acute respiratory failure, unspecified whether with hypoxia or hypercapnia: Secondary | ICD-10-CM | POA: Diagnosis not present

## 2020-01-09 DIAGNOSIS — U071 COVID-19: Secondary | ICD-10-CM | POA: Diagnosis not present

## 2020-01-09 LAB — CBC WITH DIFFERENTIAL/PLATELET
Abs Immature Granulocytes: 1.15 10*3/uL — ABNORMAL HIGH (ref 0.00–0.07)
Basophils Absolute: 0.1 10*3/uL (ref 0.0–0.1)
Basophils Relative: 0 %
Eosinophils Absolute: 0 10*3/uL (ref 0.0–0.5)
Eosinophils Relative: 0 %
HCT: 33.8 % — ABNORMAL LOW (ref 36.0–46.0)
Hemoglobin: 10.7 g/dL — ABNORMAL LOW (ref 12.0–15.0)
Immature Granulocytes: 5 %
Lymphocytes Relative: 6 %
Lymphs Abs: 1.3 10*3/uL (ref 0.7–4.0)
MCH: 29.6 pg (ref 26.0–34.0)
MCHC: 31.7 g/dL (ref 30.0–36.0)
MCV: 93.4 fL (ref 80.0–100.0)
Monocytes Absolute: 1.8 10*3/uL — ABNORMAL HIGH (ref 0.1–1.0)
Monocytes Relative: 8 %
Neutro Abs: 19.2 10*3/uL — ABNORMAL HIGH (ref 1.7–7.7)
Neutrophils Relative %: 81 %
Platelets: 259 10*3/uL (ref 150–400)
RBC: 3.62 MIL/uL — ABNORMAL LOW (ref 3.87–5.11)
RDW: 13 % (ref 11.5–15.5)
WBC: 23.6 10*3/uL — ABNORMAL HIGH (ref 4.0–10.5)
nRBC: 0.2 % (ref 0.0–0.2)

## 2020-01-09 LAB — BASIC METABOLIC PANEL
Anion gap: 8 (ref 5–15)
BUN: 35 mg/dL — ABNORMAL HIGH (ref 6–20)
CO2: 41 mmol/L — ABNORMAL HIGH (ref 22–32)
Calcium: 8.7 mg/dL — ABNORMAL LOW (ref 8.9–10.3)
Chloride: 89 mmol/L — ABNORMAL LOW (ref 98–111)
Creatinine, Ser: 0.51 mg/dL (ref 0.44–1.00)
GFR, Estimated: >60 mL/min (ref >60)
Glucose, Bld: 157 mg/dL — ABNORMAL HIGH (ref 70–99)
Potassium: 4.7 mmol/L (ref 3.5–5.1)
Sodium: 138 mmol/L (ref 135–145)

## 2020-01-09 LAB — PHOSPHORUS: Phosphorus: 3.5 mg/dL (ref 2.5–4.6)

## 2020-01-09 LAB — GLUCOSE, CAPILLARY
Glucose-Capillary: 185 mg/dL — ABNORMAL HIGH (ref 70–99)
Glucose-Capillary: 161 mg/dL — ABNORMAL HIGH (ref 70–99)
Glucose-Capillary: 183 mg/dL — ABNORMAL HIGH (ref 70–99)
Glucose-Capillary: 190 mg/dL — ABNORMAL HIGH (ref 70–99)
Glucose-Capillary: 139 mg/dL — ABNORMAL HIGH (ref 70–99)
Glucose-Capillary: 192 mg/dL — ABNORMAL HIGH (ref 70–99)

## 2020-01-09 LAB — PROCALCITONIN: Procalcitonin: <0.1 ng/mL

## 2020-01-09 LAB — FIBRIN DERIVATIVES D-DIMER (ARMC ONLY): Fibrin derivatives D-dimer (ARMC): 1215.01 ng/mL (FEU) — ABNORMAL HIGH (ref 0.00–499.00)

## 2020-01-09 LAB — C-REACTIVE PROTEIN: CRP: 7.2 mg/dL — ABNORMAL HIGH (ref <1.0)

## 2020-01-09 LAB — MAGNESIUM: Magnesium: 2.5 mg/dL — ABNORMAL HIGH (ref 1.7–2.4)

## 2020-01-09 LAB — HEPARIN LEVEL (UNFRACTIONATED): Heparin Unfractionated: 0.37 IU/mL (ref 0.30–0.70)

## 2020-01-09 LAB — FERRITIN: Ferritin: 551 ng/mL — ABNORMAL HIGH (ref 11–307)

## 2020-01-09 MED ORDER — CEFAZOLIN SODIUM-DEXTROSE 2-4 GM/100ML-% IV SOLN
2.0000 g | Freq: Three times a day (TID) | INTRAVENOUS | Status: AC
  Administered 2020-01-09 – 2020-01-13 (×13): 2 g via INTRAVENOUS
  Filled 2020-01-09 (×13): qty 100

## 2020-01-09 MED ORDER — HYDROMORPHONE BOLUS VIA INFUSION
0.5000 mg | INTRAVENOUS | Status: DC | PRN
  Administered 2020-01-14 – 2020-01-17 (×3): 0.5 mg via INTRAVENOUS
  Filled 2020-01-09: qty 1

## 2020-01-09 MED ORDER — VECURONIUM BROMIDE 10 MG IV SOLR
0.0000 ug/kg/min | Status: AC
  Administered 2020-01-10: 1.4 ug/kg/min via INTRAVENOUS
  Filled 2020-01-09 (×2): qty 100

## 2020-01-09 MED ORDER — SODIUM CHLORIDE 0.9 % IV SOLN
1.0000 mg/h | INTRAVENOUS | Status: DC
  Administered 2020-01-10 – 2020-01-14 (×8): 4 mg/h via INTRAVENOUS
  Administered 2020-01-15 – 2020-01-16 (×3): 3.5 mg/h via INTRAVENOUS
  Administered 2020-01-17 – 2020-01-19 (×5): 4 mg/h via INTRAVENOUS
  Filled 2020-01-09 (×20): qty 5

## 2020-01-09 MED ORDER — MIDAZOLAM 50MG/50ML (1MG/ML) PREMIX INFUSION
2.0000 mg/h | INTRAVENOUS | Status: DC
  Administered 2020-01-09 – 2020-01-14 (×19): 10 mg/h via INTRAVENOUS
  Administered 2020-01-14: 9 mg/h via INTRAVENOUS
  Administered 2020-01-14: 10 mg/h via INTRAVENOUS
  Administered 2020-01-15: 9 mg/h via INTRAVENOUS
  Administered 2020-01-15: 8 mg/h via INTRAVENOUS
  Administered 2020-01-15 (×2): 9 mg/h via INTRAVENOUS
  Administered 2020-01-16: 10 mg/h via INTRAVENOUS
  Administered 2020-01-16 (×3): 8 mg/h via INTRAVENOUS
  Administered 2020-01-17 – 2020-01-19 (×12): 10 mg/h via INTRAVENOUS
  Filled 2020-01-09 (×42): qty 50

## 2020-01-09 MED ORDER — ARTIFICIAL TEARS OPHTHALMIC OINT
1.0000 "application " | TOPICAL_OINTMENT | Freq: Three times a day (TID) | OPHTHALMIC | Status: DC
  Administered 2020-01-10 – 2020-01-19 (×29): 1 via OPHTHALMIC
  Filled 2020-01-09: qty 1

## 2020-01-09 MED ORDER — MIDAZOLAM BOLUS VIA INFUSION
1.0000 mg | INTRAVENOUS | Status: DC | PRN
  Administered 2020-01-14: 1 mg via INTRAVENOUS
  Administered 2020-01-17: 2 mg via INTRAVENOUS
  Filled 2020-01-09: qty 2

## 2020-01-09 MED ORDER — HYDROMORPHONE HCL 1 MG/ML IJ SOLN: 1.0000 mg | Freq: Once | INTRAMUSCULAR | Status: DC

## 2020-01-09 MED ORDER — VECURONIUM BROMIDE 10 MG IV SOLR: 0.1000 mg/kg | INTRAVENOUS | Status: DC | PRN

## 2020-01-09 NOTE — Plan of Care (Signed)
Pt maintained supine this shift, O2 sats adequate with transient desats equal to or greater than 87%.  Vecuronium gtt at 1, TOF twitches 2-3 visible, not read by the monitor, BIS 36-45.  NSR w/ PACs, BP adequate.  Ventilator remains w 100% FIO2 and 16 PEEP.  PT does not respond to painful stimulus. Skin intact.

## 2020-01-09 NOTE — Progress Notes (Signed)
Patient's husband called to get an update. Husband also requested patient to be changed from a partial to full code. E-link MD notified, will notify ICU MD in the AM.

## 2020-01-09 NOTE — Progress Notes (Signed)
ANTICOAGULATION CONSULT NOTE  Pharmacy Consult for Heparin  Indication: pulmonary embolus  Patient Measurements: Height: 5\' 6"  (167.6 cm) Weight: 120 kg (264 lb 8.8 oz) IBW/kg (Calculated) : 59.3 Heparin Dosing Weight: 89.15 kg   Labs: Recent Labs    01/07/20 0410 01/07/20 0410 01/08/20 0458 01/09/20 0533  HGB 9.5*   < > 10.5* 10.7*  HCT 30.1*  --  33.4* 33.8*  PLT 276  --  329 259  HEPARINUNFRC 0.37  --  0.41 0.37  CREATININE 0.61  --  0.58 0.51   < > = values in this interval not displayed.    Estimated Creatinine Clearance: 103.6 mL/min (by C-G formula based on SCr of 0.51 mg/dL).   Medical History: Past Medical History:  Diagnosis Date  . Hepatitis   . Hypertension   . Sciatica    Assessment: Patient is a 54 y/o F with medical history as above who is admitted with severe COVID-19 pneumonia with ARDS requiring intubation and mechanical ventilation. Now with concern for pulmonary embolus. Pharmacy has been consulted to initiate heparin infusion for high clinical suspicion for pulmonary embolus.   CBC notable for stable anemia  Goal of Therapy:  Heparin level 0.3-0.7 units/ml Monitor platelets by anticoagulation protocol: Yes   Plan:  --11/3 at 1435 HL = 0.24, subtherapeutic. Will order heparin 1000 unit IV bolus x 1 and increase heparin infusion to 1750 units/hr --Re-check HL 6 hours after rate change --Daily CBC per protocol  11/3:  HL @ 2105 = 0.37 Will continue pt on current rate and draw confirmation level in 6 hrs on 11/4 @ 0300.   11/4: HL @ 0300 = 0.37 Will continue pt on current rate and recheck HL on 11/5 with AM labs.   11/5: HL @ 0410 = 0.37 Will continue pt on current rate and recheck HL on 11/6 with AM labs.   11/6: HL @ 0458 = 0.41 Will continue pt on current rate and recheck HL on 11/7 with AM labs.   11/7: HL @ 0533 = 0.37 Will continue pt on current rate and recheck HL on 11/8 with AM labs.   Venola Castello D 01/09/2020,6:48 AM

## 2020-01-09 NOTE — Progress Notes (Signed)
CRITICAL CARE NOTE  Date of admit 12/22/2019 LOS 14 days  BRIEF 54 year old remote former smoker, with no chronic significant medical issues other than obesity and hypertension, who presents for evaluation of shortness of breath over the last week. The patient states that approximately 6 weeks ago she developed a dry hacking cough which is not unusual for her during "allergy season and change of season". However, approximately a week ago she noted fever and congestion and feeling significantly short of breath. Despite this she went to have her annual medical exam done and had a flu vaccine given at that time. She not vaccinated against COVID-19. She has had some chest discomfort when coughing but not at any other time. No orthopnea or paroxysmal nocturnal dyspnea until last night. Noted that she could not lay flat to sleep. He has not had any abdominal pain, no nausea, vomiting or bowel habit disruption. She has had mild to moderate headache for the last week. Cough became almost constant a day ago which prompted her to come to the emergency room today. Aside from this she voices no other complaint.  She was evaluated at the emergency room where she was noted to be tachypneic and have a temperature of 101.6 F on arrival. She also was noted to have oxygen saturations of 56% on room air. She was placed on heated high flow O2 and nonrebreather mask and is now saturating 100%. PCCM has been asked to admit the patient to stepdown/ICU.  PAST HX  has a past medical history of Hepatitis, Hypertension, and Sciatica.  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   MICRO Covid PCR 12/06/2019 = Positiver [flu PCR negative] Blood culture 12/25/2019-negative Urine culture 12/27/2019-less than 10,000 colonies insignificant growth Blood culture  01/02/2020-negative Respiratory culture tracheal aspirate 01/03/2020 -rare MSSA MRSA PCR 01/04/2020-negative     ANTIBIOTICS Remdesivir 12/10/2019-12/24/2019 Cefepime 12/27/2019-12/27/2019 Cefepime 01/02/2020-01/06/2020 Cefazolin 01/06/2020- x 7 days     EVENTS   10/24 severe hypoxia. COVID Positivie 10/25 intubation and MV support and severe hypoxia, ETT tube exchanged 10/26-10/31 severe hypoxia and severe ARDS 10/30 - 2h of nimbex and  10/31 early hours - 24h of vec 11/1 high risk for cardiac arrest and death. MSSA on Trch aspiiate Jan 27, 2023 severe ARDS, s/p proning.  R calf DVT  -started on heparin gtt 11/4 severe hypoxia-did NOT tolerate proning. ECHO 0 normal LV and RV 11/5 - 75% fio32, high peep. Not on pressor.SUpine ventilation. Prn vec. Making urine. On dilaudid gtt and versed gtt. Per Pharmacy got 2h of nimbex and then 24h of continuous on 10/30 - 10/31. +8L since admit. Heparn gtt contiues  01/08/2020- desaturatiion overnight with worsening PF ratio. REdness in neck after niimbex. Now Prone since 0.30 today . On vec gtt with sedation gtt. Fio2 100% , peep 16 -> pulse ox 88%.. Not on perssors. Making urine. Diuresing well. Down t to +4L volume overload since admit. LITD CODe   SUBJECTIVE/OVERNIGHT/INTERVAL HX   01/09/2020 -could not tolerate proning yesterday-desaturated.  Made supine.  Supine position better on the ventilator completing 48 hours of continuous paralytic later this evening.  On the ventilator 100% FiO2/PEEP 16. -> pulse ox 97% On sedation with Dilaudid infusion and Versed infusion.  Paralyzed with rocuronium infusion.  On IV heparin infusion.  Tube feeds ongoing. Afebrile  +2.9L volume overload since admit   BP 126/70    Pulse 89    Temp  98.6 F (37 C)    Resp 15    Ht 5\' 6"  (1.676 m)    Wt 120 kg    LMP 08/16/2014    SpO2 98%    BMI 42.70 kg/m    I/O last 3 completed shifts: In: 4030.1 [I.V.:2265.1; NG/GT:1265; IV Piggyback:500] Out: 7275  [Urine:7275] Total I/O In: 857.3 [I.V.:347.3; NG/GT:510] Out: 1560 [Urine:1560]  SpO2: 98 % O2 Flow Rate (L/min): 0 L/min (no flow data to report) FiO2 (%): 100 %  Estimated body mass index is 42.7 kg/m as calculated from the following:   Height as of this encounter: 5\' 6"  (1.676 m).   Weight as of this encounter: 120 kg.   General Appearance:  Looks criticall ill OBESE - + Head:  Normocephalic, without obvious abnormality, atraumatic Eyes:  PERRL - yes, conjunctiva/corneas - muddy     Ears:  Normal external ear canals, both ears Nose:  G tube - no Throat:  ETT TUBE - yes , OG tube - y3s Neck:  Supple,  No enlargement/tenderness/nodules Lungs: Clear to auscultation bilaterally, Ventilator   Synchrony - yes,100% fio2./peep 16 -> pulse ox 97% Heart:  S1 and S2 normal, no murmur, CVP - no.  Pressors - no Abdomen:  Soft, no masses, no organomegaly Genitalia / Rectal:  Not done Extremities:  Extremities- no Skin:  ntact in exposed areas . Sacral area - no Neurologic:  Sedation - yes with vec-> RASS -5 BISS 44 . Moves all 4s - no. CAM-ICU - sedated and paralyzed .      LABS    PULMONARY Recent Labs  Lab 01/06/20 1543 01/07/20 1831 01/08/20 0007 01/08/20 0800 01/08/20 1400  PHART 7.45 7.39 7.36 7.46* 7.48*  PCO2ART 55* 69* 70* 66* 59*  PO2ART 72* 57* 55* 54* 53*  HCO3 38.2* 41.8* 39.5* 46.9* 43.9*  O2SAT 95.0 88.9 86.9 89.4 89.5    CBC Recent Labs  Lab 01/07/20 0410 01/08/20 0458 01/09/20 0533  HGB 9.5* 10.5* 10.7*  HCT 30.1* 33.4* 33.8*  WBC 17.1* 25.2* 23.6*  PLT 276 329 259    COAGULATION No results for input(s): INR in the last 168 hours.  CARDIAC  No results for input(s): TROPONINI in the last 168 hours. No results for input(s): PROBNP in the last 168 hours.   CHEMISTRY Recent Labs  Lab 01/03/20 0823 01/03/20 0823 01/04/20 0639 01/04/20 0639 01/05/20 0450 01/05/20 0450 01/06/20 0507 01/06/20 0507 01/07/20 0410 01/07/20 0410  01/08/20 0458 01/09/20 0533  NA 143   < > 142   < > 144  --  142  --  141  --  135 138  K 3.5   < > 3.5   < > 3.7   < > 4.3   < > 5.1   < > 4.7 4.7  CL 94*   < > 94*   < > 99  --  100  --  101  --  89* 89*  CO2 40*   < > 37*   < > 36*  --  32  --  30  --  35* 41*  GLUCOSE 122*   < > 122*   < > 98  --  164*  --  178*  --  158* 157*  BUN 36*   < > 38*   < > 43*  --  33*  --  32*  --  34* 35*  CREATININE 0.59   < > 0.58   < > 0.62  --  0.54  --  0.61  --  0.58 0.51  CALCIUM 8.3*   < > 8.2*   < > 8.1*  --  8.1*  --  8.6*  --  8.5* 8.7*  MG 2.3   < > 2.4  --  2.6*  --  2.6*  --   --   --  2.7* 2.5*  PHOS 2.9  --  3.0  --  4.9*  --  3.8  --   --   --   --  3.5   < > = values in this interval not displayed.   Estimated Creatinine Clearance: 103.6 mL/min (by C-G formula based on SCr of 0.51 mg/dL).   LIVER Recent Labs  Lab 01/03/20 0823  AST 28  ALT 57*  ALKPHOS 45  BILITOT 0.7  PROT 6.6  ALBUMIN 2.2*     INFECTIOUS Recent Labs  Lab 01/07/20 0410 01/08/20 0458 01/09/20 0533  LATICACIDVEN  --  1.8  --   PROCALCITON <0.10 <0.10 <0.10     ENDOCRINE CBG (last 3)  Recent Labs    01/09/20 0320 01/09/20 0746 01/09/20 1155  GLUCAP 185* 161* 183*    No Known Allergies      IMAGING x48h  - image(s) personally visualized  -   highlighted in bold DG Chest Port 1 View  Result Date: 01/09/2020 CLINICAL DATA:  Intubation, COVID-19 EXAM: PORTABLE CHEST 1 VIEW COMPARISON:  Portable exam 0528 hours compared to 01/08/2020 FINDINGS: Tip of endotracheal tube projects 4.6 cm above carina. Nasogastric tube extends into stomach. RIGHT arm PICC line tip projects over SVC. Normal heart size and mediastinal contours. BILATERAL pulmonary infiltrates consistent with multifocal pneumonia, improved in RIGHT upper lobe since previous exam. No pleural effusion or pneumothorax. IMPRESSION: BILATERAL pulmonary infiltrates consistent with multifocal pneumonia and history of COVID-19 , improved in  RIGHT upper lobe since prior study. Electronically Signed   By: Ulyses Southward M.D.   On: 01/09/2020 11:35   DG Chest Port 1 View  Result Date: 01/08/2020 CLINICAL DATA:  COVID-19, admitted 12 days ago, on ventilator, hypertension, former smoker, hepatitis EXAM: PORTABLE CHEST 1 VIEW COMPARISON:  Portable exam 0802 hours compared to 01/06/2020 FINDINGS: Tip of endotracheal tube projects 4.7 cm above carina. Nasogastric tube extends into stomach. RIGHT arm PICC line tip projects over SVC. Borderline enlargement of cardiac silhouette. Stable mediastinal contours. Patchy infiltrates bilaterally consistent with multifocal pneumonia and history of COVID-19. No pleural effusion or pneumothorax. Probable component of atelectasis at RIGHT base. IMPRESSION: Diffuse infiltrates of COVID-19 pneumonia, little changed. Slightly increased subsegmental atelectasis at RIGHT base. Electronically Signed   By: Ulyses Southward M.D.   On: 01/08/2020 11:42   DG Chest Port 1 View  Result Date: 01/08/2020 CLINICAL DATA:  COVID-19, admitted 12 days ago, on ventilator, hypertension, former smoker, hepatitis EXAM: PORTABLE CHEST 1 VIEW COMPARISON:  Portable exam 1102 hours compared to 0802 hours FINDINGS: Tip of endotracheal tube projects 5.6 cm above carina. Nasogastric tube extends into stomach. RIGHT arm PICC line, tip projecting over proximal SVC. Upper normal heart size. Patchy airspace infiltrates throughout both lungs consistent with multifocal pneumonia and COVID-19. No pleural effusion or pneumothorax. IMPRESSION: Diffuse pulmonary infiltrates of COVID-19 pneumonia, unchanged. Electronically Signed   By: Ulyses Southward M.D.   On: 01/08/2020 11:40   ECHOCARDIOGRAM LIMITED  Result Date: 01/08/2020    ECHOCARDIOGRAM LIMITED REPORT   Patient Name:   AVALEIGH DECUIR [facility] Date of Exam: 01/08/2020 Medical Rec #:  [id number]  Height:       66.0 in Accession #:    [id number]      Weight:       266.3 lb Date of Birth:  1965-11-27        BSA:           2.259 m Patient Age:    54 years        BP:           147/77 mmHg Patient Gender: F               HR:           62 bpm. Exam Location:  [facility] Procedure: 2D Echo Indications:     Acute Respiratory Insufficiency 518.82 / R06.89  History:         Patient has prior history of Echocardiogram examinations, most                  recent 01/06/2020.  Sonographer:     Johnathan Hausen Referring Phys:  [id number] Diagnosing Phys: Debbe Odea MD IMPRESSIONS  1. Left ventricular ejection fraction, by estimation, is 60 to 65%. The left ventricle has normal function. The left ventricle has no regional wall motion abnormalities.  2. Right ventricular systolic function is normal. The right ventricular size is normal. There is normal pulmonary artery systolic pressure.  3. The mitral valve is normal in structure. No evidence of mitral valve regurgitation.  4. The aortic valve is grossly normal. Aortic valve regurgitation is not visualized.  5. The inferior vena cava is dilated in size with >50% respiratory variability, suggesting right atrial pressure of 8 mmHg. FINDINGS  Left Ventricle: Left ventricular ejection fraction, by estimation, is 60 to 65%. The left ventricle has normal function. The left ventricle has no regional wall motion abnormalities. The left ventricular internal cavity size was normal in size. There is  no left ventricular hypertrophy. Right Ventricle: The right ventricular size is normal. Right ventricular systolic function is normal. There is normal pulmonary artery systolic pressure. The tricuspid regurgitant velocity is 2.58 m/s, and with an assumed right atrial pressure of 8 mmHg,  the estimated right ventricular systolic pressure is 34.6 mmHg. Pericardium: There is no evidence of pericardial effusion. Mitral Valve: The mitral valve is normal in structure. Tricuspid Valve: The tricuspid valve is not well visualized. Tricuspid valve regurgitation is not demonstrated. Aortic Valve: The aortic  valve is grossly normal. Aortic valve regurgitation is not visualized. Pulmonic Valve: The pulmonic valve was not well visualized. Aorta: The aortic root is normal in size and structure. Venous: The inferior vena cava is dilated in size with greater than 50% respiratory variability, suggesting right atrial pressure of 8 mmHg. IVC IVC diam: 2.35 cm TRICUSPID VALVE TR Peak grad:   26.6 mmHg TR Vmax:        258.00 cm/s Debbe Odea MD Electronically signed by Debbe Odea MD Signature Date/Time: 01/08/2020/3:14:41 PM    Final       DG Chest Port 1 View  Result Date: 01/09/2020 CLINICAL DATA:  Intubation, COVID-19 EXAM: PORTABLE CHEST 1 VIEW COMPARISON:  Portable exam 0528 hours compared to 01/08/2020 FINDINGS: Tip of endotracheal tube projects 4.6 cm above carina. Nasogastric tube extends into stomach. RIGHT arm PICC line tip projects over SVC. Normal heart size and mediastinal contours. BILATERAL pulmonary infiltrates consistent with multifocal pneumonia, improved in RIGHT upper lobe since previous exam. No pleural effusion or pneumothorax. IMPRESSION: BILATERAL pulmonary infiltrates consistent with multifocal pneumonia and  history of COVID-19 , improved in RIGHT upper lobe since prior study. Electronically Signed   By: Ulyses Southward M.D.   On: 01/09/2020 11:35   ECHOCARDIOGRAM LIMITED  Result Date: 01/08/2020    ECHOCARDIOGRAM LIMITED REPORT   Patient Name:   HANAKO GRUEN [facility] Date of Exam: 01/08/2020 Medical Rec #:  [id number]       Height:       66.0 in Accession #:    [id number]      Weight:       266.3 lb Date of Birth:  [birth year]        BSA:          2.259 m Patient Age:    54 years        BP:           147/77 mmHg Patient Gender: F               HR:           62 bpm. Exam Location:  [facility] Procedure: 2D Echo Indications:     Acute Respiratory Insufficiency 518.82 / R06.89  History:         Patient has prior history of Echocardiogram examinations, most                  recent 01/06/2020.  Sonographer:      Johnathan Hausen Referring Phys:  [id number] Diagnosing Phys: Debbe Odea MD IMPRESSIONS  1. Left ventricular ejection fraction, by estimation, is 60 to 65%. The left ventricle has normal function. The left ventricle has no regional wall motion abnormalities.  2. Right ventricular systolic function is normal. The right ventricular size is normal. There is normal pulmonary artery systolic pressure.  3. The mitral valve is normal in structure. No evidence of mitral valve regurgitation.  4. The aortic valve is grossly normal. Aortic valve regurgitation is not visualized.  5. The inferior vena cava is dilated in size with >50% respiratory variability, suggesting right atrial pressure of 8 mmHg. FINDINGS  Left Ventricle: Left ventricular ejection fraction, by estimation, is 60 to 65%. The left ventricle has normal function. The left ventricle has no regional wall motion abnormalities. The left ventricular internal cavity size was normal in size. There is  no left ventricular hypertrophy. Right Ventricle: The right ventricular size is normal. Right ventricular systolic function is normal. There is normal pulmonary artery systolic pressure. The tricuspid regurgitant velocity is 2.58 m/s, and with an assumed right atrial pressure of 8 mmHg,  the estimated right ventricular systolic pressure is 34.6 mmHg. Pericardium: There is no evidence of pericardial effusion. Mitral Valve: The mitral valve is normal in structure. Tricuspid Valve: The tricuspid valve is not well visualized. Tricuspid valve regurgitation is not demonstrated. Aortic Valve: The aortic valve is grossly normal. Aortic valve regurgitation is not visualized. Pulmonic Valve: The pulmonic valve was not well visualized. Aorta: The aortic root is normal in size and structure. Venous: The inferior vena cava is dilated in size with greater than 50% respiratory variability, suggesting right atrial pressure of 8 mmHg. IVC IVC diam: 2.35 cm TRICUSPID  VALVE TR Peak grad:   26.6 mmHg TR Vmax:        258.00 cm/s Debbe Odea MD Electronically signed by Debbe Odea MD Signature Date/Time: 01/08/2020/3:14:41 PM    Final       ASSESSMENT/PLAN   SEVERE Acute hypoxemic respiratory failure due to COVID-19 pneumonia / ARDS.  Did not tolerate prone ventilation 01/08/2020.  On continuous Nimbex since evening of 01/07/2020  01/09/2020 - > severe refractory ARDS.  On supine ventilation along with vecuronium continuous since late 01/07/2020   plan -Avoid proning continue supine ventilation  ARDS Measure  - Target TVol 6-8cc/kgIBW  - Target Plateau Pressure < 30cm H20  - Target driving pressure less than 15 cm of water  - Target PaO2 55-65: titrate PEEP/FiO2 per protocol    -And vecuronium at the 48-hour time point which is around 9 PM on 01/09/2020  -- VAP bundle  =- continue heparin gtt for dvt  -get repeat echo  Sedation needs   -01/09/2020 - RASS -5, BIS  Score - 45  Sync with vent on Vec  Plan  - bis socre< 60 - dialudid gtt  - versed gtt - continuous vec (started 01/07/20 pm) -ended 48-hour time point which is 01/09/2020 around 9 PM   COVID  plan - IV STEROIDS per protocol [need to set ended]  VAP -MSSA  01/09/2020 - fever improved     Plan  - cefazolin total 7 days    Volume overload   +3L on 11//7/21 and improved with lasix  Plan  - lasix tid to continue with monitoring of BP/BUn/Cerat  Anemia criticall illness  01/09/2020 - no bleeding  Plan - PRBC for hgb </= 6.9gm%    - exceptions are   -  if ACS susepcted/confirmed then transfuse for hgb </= 8.0gm%,  or    -  active bleeding with hemodynamic instability, then transfuse regardless of hemoglobin value   At at all times try to transfuse 1 unit prbc as possible with exception of active hemorrhage  LE DVT in hospital 01/05/20. ECHO -Normal RV  Plan  - IV heparin gtt to continue  - monitor for bleeding and platelets   Best practice:  Diet:  TF Pain/Anxiety/Delirium protocol (if indicated): see aboe VAP protocol (if indicated): yes DVT prophylaxis: IV heparin gtt GI prophylaxis: ppi Glucose control: ssi  Mobility: bed rest  Code Status: full  Family Communication:   -  husband Niurka Benecke [phone] -> updated 01/08/20 via phone -> explained prognosis is poor and high risk for cardiac arrest. Advised against CPR. He said he would leave it to our judgement. Dw/ bedside RN - Oneita Jolly - she is in agreement to no CPR   -Husband Maniya Donovan at the bedside on 01/09/2020  Disposition: icu     ATTESTATION & SIGNATURE   The patient KENIA TEAGARDEN is critically ill with multiple organ systems failure and requires high complexity decision making for assessment and support, frequent evaluation and titration of therapies, application of advanced monitoring technologies and extensive interpretation of multiple databases.   Critical Care Time devoted to patient care services described in this note is  40  Minutes. This time reflects time of care of this signee Dr Kalman Shan. This critical care time does not reflect procedure time, or teaching time or supervisory time of PA/NP/Med student/Med Resident etc but could involve care discussion time     Dr. Kalman Shan, M.D., [facility].C.P Pulmonary and Critical Care Medicine Staff Physician [facility]  Pulmonary and Critical Care Pager: [phone], If no answer or between  15:00h - 7:00h: call [phone]  01/09/2020 3:00 PM

## 2020-01-09 NOTE — Progress Notes (Signed)
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 Patient is currently intubated, sedated, and on mechanical ventilation. Nutrition currently provided via tube feeds.   Goal of Therapy:  Electrolytes WNL  Plan:   No electrolyte replacement indicated today. Continue to monitor.  Hypernatremia: free water increased 10/30 to 200 mL per tube q4h (1.2L/day): sodium now stable - now at 138 (monitor closely)  BMP tomorrow AM  Albina Billet, PharmD, BCPS Clinical Pharmacist 01/09/2020 8:50 AM

## 2020-01-09 NOTE — Progress Notes (Signed)
eLink Physician-Brief Progress Note Patient Name: Carrie Davies DOB: [birth year] MRN: [id number]   Date of Service  01/09/2020  HPI/Events of Note  Bedside RN sent me a secure chat indicating that patient's spouse had called to reverse previous DNR order, I called Carrie Davies and verified that he wanted his wife's FULL CODE status restored. Vecuronium infusion needs to be reordered.  eICU Interventions  Patient's code status changed back to "FULL CODE". Vecuronium infusion reordered.        Thomasene Lot Darneisha Windhorst 01/09/2020, 10:40 PM

## 2020-01-09 NOTE — Progress Notes (Signed)
eLink Physician-Brief Progress Note Patient Name: Carrie Davies DOB: 1965/07/23 MRN: [id number]   Date of Service  01/09/2020  HPI/Events of Note  Patient with severe Covid pneumonia and hypoxic respiratory failure. She requires intermittent paralytics for ventilator dyssynchrony with desaturation and the order has expired. She is on an infusion of 10 mg of Midazolam per hour. She has a normal creatinine.  eICU Interventions  PRN Vecuronium reordered.        Thomasene Lot Akima Slaugh 01/09/2020, 9:54 PM

## 2020-01-10 ENCOUNTER — Inpatient Hospital Stay: Payer: BC Managed Care – PPO

## 2020-01-10 DIAGNOSIS — J80 Acute respiratory distress syndrome: Secondary | ICD-10-CM | POA: Diagnosis not present

## 2020-01-10 DIAGNOSIS — U071 COVID-19: Secondary | ICD-10-CM | POA: Diagnosis not present

## 2020-01-10 DIAGNOSIS — J9600 Acute respiratory failure, unspecified whether with hypoxia or hypercapnia: Secondary | ICD-10-CM | POA: Diagnosis not present

## 2020-01-10 LAB — BASIC METABOLIC PANEL
Anion gap: 10 (ref 5–15)
BUN: 39 mg/dL — ABNORMAL HIGH (ref 6–20)
CO2: 44 mmol/L — ABNORMAL HIGH (ref 22–32)
Calcium: 8.8 mg/dL — ABNORMAL LOW (ref 8.9–10.3)
Chloride: 84 mmol/L — ABNORMAL LOW (ref 98–111)
Creatinine, Ser: 0.54 mg/dL (ref 0.44–1.00)
GFR, Estimated: >60 mL/min (ref >60)
Glucose, Bld: 183 mg/dL — ABNORMAL HIGH (ref 70–99)
Potassium: 4.6 mmol/L (ref 3.5–5.1)
Sodium: 138 mmol/L (ref 135–145)

## 2020-01-10 LAB — CBC WITH DIFFERENTIAL/PLATELET
Abs Immature Granulocytes: 1.28 10*3/uL — ABNORMAL HIGH (ref 0.00–0.07)
Basophils Absolute: 0.1 10*3/uL (ref 0.0–0.1)
Basophils Relative: 0 %
Eosinophils Absolute: 0 10*3/uL (ref 0.0–0.5)
Eosinophils Relative: 0 %
HCT: 36.4 % (ref 36.0–46.0)
Hemoglobin: 11.4 g/dL — ABNORMAL LOW (ref 12.0–15.0)
Immature Granulocytes: 6 %
Lymphocytes Relative: 6 %
Lymphs Abs: 1.4 10*3/uL (ref 0.7–4.0)
MCH: 29.7 pg (ref 26.0–34.0)
MCHC: 31.3 g/dL (ref 30.0–36.0)
MCV: 94.8 fL (ref 80.0–100.0)
Monocytes Absolute: 1.7 10*3/uL — ABNORMAL HIGH (ref 0.1–1.0)
Monocytes Relative: 8 %
Neutro Abs: 18.1 10*3/uL — ABNORMAL HIGH (ref 1.7–7.7)
Neutrophils Relative %: 80 %
Platelets: 210 10*3/uL (ref 150–400)
RBC: 3.84 MIL/uL — ABNORMAL LOW (ref 3.87–5.11)
RDW: 13.7 % (ref 11.5–15.5)
Smear Review: NORMAL
WBC: 22.6 10*3/uL — ABNORMAL HIGH (ref 4.0–10.5)
nRBC: 0.4 % — ABNORMAL HIGH (ref 0.0–0.2)

## 2020-01-10 LAB — PHOSPHORUS: Phosphorus: 3.7 mg/dL (ref 2.5–4.6)

## 2020-01-10 LAB — GLUCOSE, CAPILLARY
Glucose-Capillary: 193 mg/dL — ABNORMAL HIGH (ref 70–99)
Glucose-Capillary: 176 mg/dL — ABNORMAL HIGH (ref 70–99)
Glucose-Capillary: 144 mg/dL — ABNORMAL HIGH (ref 70–99)
Glucose-Capillary: 131 mg/dL — ABNORMAL HIGH (ref 70–99)
Glucose-Capillary: 141 mg/dL — ABNORMAL HIGH (ref 70–99)

## 2020-01-10 LAB — HEPARIN LEVEL (UNFRACTIONATED): Heparin Unfractionated: 0.51 IU/mL (ref 0.30–0.70)

## 2020-01-10 LAB — C-REACTIVE PROTEIN: CRP: 5.7 mg/dL — ABNORMAL HIGH (ref <1.0)

## 2020-01-10 LAB — FERRITIN: Ferritin: 524 ng/mL — ABNORMAL HIGH (ref 11–307)

## 2020-01-10 LAB — FIBRIN DERIVATIVES D-DIMER (ARMC ONLY): Fibrin derivatives D-dimer (ARMC): 1337.21 ng/mL (FEU) — ABNORMAL HIGH (ref 0.00–499.00)

## 2020-01-10 MED ORDER — ACETAZOLAMIDE 250 MG PO TABS
500.0000 mg | ORAL_TABLET | Freq: Every day | ORAL | Status: DC
  Administered 2020-01-10 – 2020-01-12 (×3): 500 mg
  Filled 2020-01-10 (×3): qty 2

## 2020-01-10 NOTE — Progress Notes (Signed)
ANTICOAGULATION CONSULT NOTE  Pharmacy Consult for Heparin  Indication: pulmonary embolus  Patient Measurements: Height: 5\' 6"  (167.6 cm) Weight: 117.4 kg (258 lb 13.1 oz) IBW/kg (Calculated) : 59.3 Heparin Dosing Weight: 89.15 kg   Labs: Recent Labs    01/08/20 0458 01/08/20 0458 01/09/20 0533 01/10/20 0529  HGB 10.5*   < > 10.7* 11.4*  HCT 33.4*  --  33.8* 36.4  PLT 329  --  259 210  HEPARINUNFRC 0.41  --  0.37 0.51  CREATININE 0.58  --  0.51  --    < > = values in this interval not displayed.    Estimated Creatinine Clearance: 102.3 mL/min (by C-G formula based on SCr of 0.51 mg/dL).   Medical History: Past Medical History:  Diagnosis Date  . Hepatitis   . Hypertension   . Sciatica    Assessment: Patient is a 54 y/o F with medical history as above who is admitted with severe COVID-19 pneumonia with ARDS requiring intubation and mechanical ventilation. Now with concern for pulmonary embolus. Pharmacy has been consulted to initiate heparin infusion for high clinical suspicion for pulmonary embolus.   CBC notable for stable anemia  Goal of Therapy:  Heparin level 0.3-0.7 units/ml Monitor platelets by anticoagulation protocol: Yes   Plan:  --11/3 at 1435 HL = 0.24, subtherapeutic. Will order heparin 1000 unit IV bolus x 1 and increase heparin infusion to 1750 units/hr --Re-check HL 6 hours after rate change --Daily CBC per protocol  11/3:  HL @ 2105 = 0.37 Will continue pt on current rate and draw confirmation level in 6 hrs on 11/4 @ 0300.   11/4: HL @ 0300 = 0.37 Will continue pt on current rate and recheck HL on 11/5 with AM labs.   11/5: HL @ 0410 = 0.37 Will continue pt on current rate and recheck HL on 11/6 with AM labs.   11/6: HL @ 0458 = 0.41 Will continue pt on current rate and recheck HL on 11/7 with AM labs.   11/7: HL @ 0533 = 0.37 Will continue pt on current rate and recheck HL on 11/8 with AM labs.   11/8: HL @ 0529 = 0.51 Will continue  pt on current rate and recheck HL on 11/9 with AM labs.   Pranavi Aure D 01/10/2020,6:32 AM

## 2020-01-10 NOTE — Progress Notes (Signed)
CRITICAL CARE NOTE  Date of admit 12/18/2019 LOS 15 days  BRIEF 54 year old remote former smoker, with no chronic significant medical issues other than obesity and hypertension, who presents for evaluation of shortness of breath over the last week. The patient states that approximately 6 weeks ago she developed a dry hacking cough which is not unusual for her during "allergy season and change of season". However, approximately a week ago she noted fever and congestion and feeling significantly short of breath. Despite this she went to have her annual medical exam done and had a flu vaccine given at that time. She not vaccinated against COVID-19. She has had some chest discomfort when coughing but not at any other time. No orthopnea or paroxysmal nocturnal dyspnea until last night. Noted that she could not lay flat to sleep. He has not had any abdominal pain, no nausea, vomiting or bowel habit disruption. She has had mild to moderate headache for the last week. Cough became almost constant a day ago which prompted her to come to the emergency room today. Aside from this she voices no other complaint.  She was evaluated at the emergency room where she was noted to be tachypneic and have a temperature of 101.6 F on arrival. She also was noted to have oxygen saturations of 56% on room air. She was placed on heated high flow O2 and nonrebreather mask and is now saturating 100%. PCCM has been asked to admit the patient to stepdown/ICU.  PAST HX  has a past medical history of Hepatitis, Hypertension, and Sciatica.  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   MICRO Covid PCR 12/18/2019 = Positiver [flu PCR negative] Blood culture 12/14/2019-negative Urine culture 12/27/2019-less than 10,000 colonies insignificant growth Blood culture  01/02/2020-negative Respiratory culture tracheal aspirate 01/03/2020 -rare MSSA MRSA PCR 01/04/2020-negative     ANTIBIOTICS Remdesivir 12/25/2019-01/02/2020 Cefepime 12/27/2019-12/27/2019 Cefepime 01/02/2020-01/06/2020 Cefazolin 01/06/2020- x 7 days     EVENTS   10/24 severe hypoxia. COVID Positivie 10/25 intubation and MV support and severe hypoxia, ETT tube exchanged 10/26-10/31 severe hypoxia and severe ARDS 10/30 - 2h of nimbex and  10/31 early hours - 24h of vec 11/1 high risk for cardiac arrest and death. MSSA on Trch aspiiate 01-31-23 severe ARDS, s/p proning.  R calf DVT  -started on heparin gtt 11/4 severe hypoxia-did NOT tolerate proning. ECHO 0 normal LV and RV 11/5 - 75% fio32, high peep. Not on pressor.SUpine ventilation. Prn vec. Making urine. On dilaudid gtt and versed gtt. Per Pharmacy got 2h of nimbex and then 24h of continuous on 10/30 - 10/31. +8L since admit. Heparn gtt contiues  01/08/2020- desaturatiion overnight with worsening PF ratio. REdness in neck after niimbex. Now Prone since 0.30 today . On vec gtt with sedation gtt. Fio2 100% , peep 16 -> pulse ox 88%.. Not on perssors. Making urine. Diuresing well. Down t to +4L volume overload since admit. LITD CODe  CC Follow up ARDS covid pneumonia  HPI 01/10/2020 -could NOT tolerated proning  sedation with Dilaudid infusion and Versed infusion.   Paralyzed with rocuronium infusion.   On IV heparin infusion.  Severe hypoxia  Vent Mode: PRVC FiO2 (%):  [90 %-100 %] 90 % Set Rate:  [30 bmp] 30 bmp Vt Set:  [400 mL] 400 mL PEEP:  [16 cmH20] 16 cmH20 Plateau Pressure:  [37 cmH20] 37 cmH20     BP (!) 141/80  Pulse 88   Temp 98.2 F (36.8 C)   Resp (!) 30   Ht 5\' 6"  (1.676 m)   Wt 117.4 kg   LMP 08/16/2014   SpO2 92%   BMI 41.77 kg/m    I/O last 3 completed shifts: In: 4394 [I.V.:1947.8; NG/GT:2046.3; IV Piggyback:399.9] Out: 6710 [Urine:6710] No intake/output data recorded.  SpO2: 92 % O2  Flow Rate (L/min): 0 L/min (no flow data to report) FiO2 (%): 90 %  Estimated body mass index is 41.77 kg/m as calculated from the following:   Height as of this encounter: 5\' 6"  (1.676 m).   Weight as of this encounter: 117.4 kg.   PHYSICAL EXAMINATION:  GENERAL:critically ill appearing, +resp distress Morbidly obese NECK: Supple. No thyromegaly. No nodules. No JVD.  PULMONARY: +rhonchi,  CARDIOVASCULAR: S1 and S2. Regular rate and rhythm. No murmurs, rubs, or gallops.  GASTROINTESTINAL: Soft, nontender, -distended. Positive bowel sounds.  MUSCULOSKELETAL: No swelling, clubbing, or edema.  NEUROLOGIC: obtunded SKIN:intact,warm,dry       LABS    PULMONARY Recent Labs  Lab 01/06/20 1543 01/07/20 1831 01/08/20 0007 01/08/20 0800 01/08/20 1400  PHART 7.45 7.39 7.36 7.46* 7.48*  PCO2ART 55* 69* 70* 66* 59*  PO2ART 72* 57* 55* 54* 53*  HCO3 38.2* 41.8* 39.5* 46.9* 43.9*  O2SAT 95.0 88.9 86.9 89.4 89.5    CBC Recent Labs  Lab 01/08/20 0458 01/09/20 0533 01/10/20 0529  HGB 10.5* 10.7* 11.4*  HCT 33.4* 33.8* 36.4  WBC 25.2* 23.6* 22.6*  PLT 329 259 210    COAGULATION No results for input(s): INR in the last 168 hours.  CARDIAC  No results for input(s): TROPONINI in the last 168 hours. No results for input(s): PROBNP in the last 168 hours.   CHEMISTRY Recent Labs  Lab 01/04/20 0639 01/04/20 0639 01/05/20 0450 01/05/20 0450 01/06/20 0507 01/06/20 0507 01/07/20 0410 01/07/20 0410 01/08/20 0458 01/08/20 0458 01/09/20 0533 01/10/20 0529  NA 142   < > 144   < > 142  --  141  --  135  --  138 138  K 3.5   < > 3.7   < > 4.3   < > 5.1   < > 4.7   < > 4.7 4.6  CL 94*   < > 99   < > 100  --  101  --  89*  --  89* 84*  CO2 37*   < > 36*   < > 32  --  30  --  35*  --  41* 44*  GLUCOSE 122*   < > 98   < > 164*  --  178*  --  158*  --  157* 183*  BUN 38*   < > 43*   < > 33*  --  32*  --  34*  --  35* 39*  CREATININE 0.58   < > 0.62   < > 0.54  --  0.61   --  0.58  --  0.51 0.54  CALCIUM 8.2*   < > 8.1*   < > 8.1*  --  8.6*  --  8.5*  --  8.7* 8.8*  MG 2.4  --  2.6*  --  2.6*  --   --   --  2.7*  --  2.5*  --   PHOS 3.0  --  4.9*  --  3.8  --   --   --   --   --  3.5 3.7   < > =  values in this interval not displayed.   Estimated Creatinine Clearance: 102.3 mL/min (by C-G formula based on SCr of 0.54 mg/dL).   LIVER No results for input(s): AST, ALT, ALKPHOS, BILITOT, PROT, ALBUMIN, INR in the last 168 hours.   INFECTIOUS Recent Labs  Lab 01/07/20 0410 01/08/20 0458 01/09/20 0533  LATICACIDVEN  --  1.8  --   PROCALCITON <0.10 <0.10 <0.10     ENDOCRINE CBG (last 3)  Recent Labs    01/09/20 2318 01/10/20 0335 01/10/20 0729  GLUCAP 192* 193* 176*    No Known Allergies      IMAGING x48h  - image(s) personally visualized  -   highlighted in bold DG Chest Port 1 View  Result Date: 01/09/2020 CLINICAL DATA:  Intubation, COVID-19 EXAM: PORTABLE CHEST 1 VIEW COMPARISON:  Portable exam 0528 hours compared to 01/08/2020 FINDINGS: Tip of endotracheal tube projects 4.6 cm above carina. Nasogastric tube extends into stomach. RIGHT arm PICC line tip projects over SVC. Normal heart size and mediastinal contours. BILATERAL pulmonary infiltrates consistent with multifocal pneumonia, improved in RIGHT upper lobe since previous exam. No pleural effusion or pneumothorax. IMPRESSION: BILATERAL pulmonary infiltrates consistent with multifocal pneumonia and history of COVID-19 , improved in RIGHT upper lobe since prior study. Electronically Signed   By: Ulyses Southward M.D.   On: 01/09/2020 11:35   DG Chest Port 1 View  Result Date: 01/08/2020 CLINICAL DATA:  COVID-19, admitted 12 days ago, on ventilator, hypertension, former smoker, hepatitis EXAM: PORTABLE CHEST 1 VIEW COMPARISON:  Portable exam 0802 hours compared to 01/06/2020 FINDINGS: Tip of endotracheal tube projects 4.7 cm above carina. Nasogastric tube extends into stomach. RIGHT arm PICC  line tip projects over SVC. Borderline enlargement of cardiac silhouette. Stable mediastinal contours. Patchy infiltrates bilaterally consistent with multifocal pneumonia and history of COVID-19. No pleural effusion or pneumothorax. Probable component of atelectasis at RIGHT base. IMPRESSION: Diffuse infiltrates of COVID-19 pneumonia, little changed. Slightly increased subsegmental atelectasis at RIGHT base. Electronically Signed   By: Ulyses Southward M.D.   On: 01/08/2020 11:42   DG Chest Port 1 View  Result Date: 01/08/2020 CLINICAL DATA:  COVID-19, admitted 12 days ago, on ventilator, hypertension, former smoker, hepatitis EXAM: PORTABLE CHEST 1 VIEW COMPARISON:  Portable exam 1102 hours compared to 0802 hours FINDINGS: Tip of endotracheal tube projects 5.6 cm above carina. Nasogastric tube extends into stomach. RIGHT arm PICC line, tip projecting over proximal SVC. Upper normal heart size. Patchy airspace infiltrates throughout both lungs consistent with multifocal pneumonia and COVID-19. No pleural effusion or pneumothorax. IMPRESSION: Diffuse pulmonary infiltrates of COVID-19 pneumonia, unchanged. Electronically Signed   By: Ulyses Southward M.D.   On: 01/08/2020 11:40   ECHOCARDIOGRAM LIMITED  Result Date: 01/08/2020    ECHOCARDIOGRAM LIMITED REPORT   Patient Name:   BERYLE BADAL [facility] Date of Exam: 01/08/2020 Medical Rec #:  [id number]       Height:       66.0 in Accession #:    [id number]      Weight:       266.3 lb Date of Birth:  [birth year]        BSA:          2.259 m Patient Age:    54 years        BP:           147/77 mmHg Patient Gender: F               HR:  62 bpm. Exam Location:  [facility] Procedure: 2D Echo Indications:     Acute Respiratory Insufficiency 518.82 / R06.89  History:         Patient has prior history of Echocardiogram examinations, most                  recent 01/06/2020.  Sonographer:     Johnathan Hausen Referring Phys:  [id number] Diagnosing Phys: Debbe Odea MD  IMPRESSIONS  1. Left ventricular ejection fraction, by estimation, is 60 to 65%. The left ventricle has normal function. The left ventricle has no regional wall motion abnormalities.  2. Right ventricular systolic function is normal. The right ventricular size is normal. There is normal pulmonary artery systolic pressure.  3. The mitral valve is normal in structure. No evidence of mitral valve regurgitation.  4. The aortic valve is grossly normal. Aortic valve regurgitation is not visualized.  5. The inferior vena cava is dilated in size with >50% respiratory variability, suggesting right atrial pressure of 8 mmHg. FINDINGS  Left Ventricle: Left ventricular ejection fraction, by estimation, is 60 to 65%. The left ventricle has normal function. The left ventricle has no regional wall motion abnormalities. The left ventricular internal cavity size was normal in size. There is  no left ventricular hypertrophy. Right Ventricle: The right ventricular size is normal. Right ventricular systolic function is normal. There is normal pulmonary artery systolic pressure. The tricuspid regurgitant velocity is 2.58 m/s, and with an assumed right atrial pressure of 8 mmHg,  the estimated right ventricular systolic pressure is 34.6 mmHg. Pericardium: There is no evidence of pericardial effusion. Mitral Valve: The mitral valve is normal in structure. Tricuspid Valve: The tricuspid valve is not well visualized. Tricuspid valve regurgitation is not demonstrated. Aortic Valve: The aortic valve is grossly normal. Aortic valve regurgitation is not visualized. Pulmonic Valve: The pulmonic valve was not well visualized. Aorta: The aortic root is normal in size and structure. Venous: The inferior vena cava is dilated in size with greater than 50% respiratory variability, suggesting right atrial pressure of 8 mmHg. IVC IVC diam: 2.35 cm TRICUSPID VALVE TR Peak grad:   26.6 mmHg TR Vmax:        258.00 cm/s Debbe Odea MD Electronically  signed by Debbe Odea MD Signature Date/Time: 01/08/2020/3:14:41 PM    Final       No results found.    ASSESSMENT/PLAN   SEVERE Acute hypoxemic respiratory failure due to COVID-19 pneumonia / ARDS.   severe refractory ARDS.  On supine ventilation along with vecuronium continuous since late 01/07/2020   Severe ACUTE Hypoxic and Hypercapnic Respiratory Failure -continue Mechanical Ventilator support -continue Bronchodilator Therapy -Wean Fio2 and PEEP as tolerated -VAP/VENT bundle implementation -Avoid proning continue supine ventilation  Acute hypoxemic respiratory failure due to COVID-19 pneumonia / ARDS Mechanical ventilation via ARDS protocol, target PRVC 6 cc/kg Wean PEEP and FiO2 as able Goal plateau pressure less than 30, driving pressure less than 15 Paralytics if necessary for vent synchrony, gas exchange Deep sedation per PAD protocol, goal RASS -4,  Diuresis as blood pressure and renal function can tolerate, goal CVP 5-8.   diuresis as tolerated based on Kidney function VAP prevention order set    NEUROLOGY Acute toxic metabolic encephalopathy, need for sedation Goal RASS -2 to -3   ID COVID - IV STEROIDS per protocol VAP -MSSA 01/09/2020 - fever improved      Volume overload  +3L on 11//7/21 and improved with lasix  lasix tid  to continue with monitoring of BP/BUn/Cerat   LE DVT in hospital 01/05/20. ECHO -Normal RV  - IV heparin gtt to continue  - monitor for bleeding and platelets Presumed PE    ELECTROLYTES -follow labs as needed -replace as needed -pharmacy consultation and following   DVT/GI PRX ordered and assessed TRANSFUSIONS AS NEEDED MONITOR FSBS I Assessed the need for Labs I Assessed the need for Foley I Assessed the need for Central Venous Line Family Discussion when available I Assessed the need for Mobilization I made an Assessment of medications to be adjusted accordingly Safety Risk assessment completed  CASE  DISCUSSED IN MULTIDISCIPLINARY ROUNDS WITH ICU TEAM    Critical Care Time devoted to patient care services described in this note is 56 minutes.   Overall, patient is critically ill, prognosis is guarded.  Patient with Multiorgan failure and at high risk for cardiac arrest and death.    Lucie Leather, M.D.  [facility]  Medical Director [facility] Medical Director [facility] Cardio-Pulmonary Department

## 2020-01-10 NOTE — Progress Notes (Signed)
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 Patient is currently intubated, sedated, and on mechanical ventilation.   Nutrition: Tube feeds + free water (180 mL/day) Diuretics: IV Lasix (d/c 11/8) switched to acetazolamide    Goal of Therapy:  Electrolytes WNL  Plan:   No electrolyte replacement indicated today  BMP tomorrow AM  Tressie Ellis 01/10/2020 12:32 PM

## 2020-01-10 NOTE — TOC Initial Note (Signed)
Transition of Care [facility]) - Initial/Assessment Note    Patient Details  Name: Carrie Davies MRN: [id number] Date of Birth: [birth year]  Transition of Care [facility]) CM/SW Contact:    Marina Goodell Phone Number:  [phone] 01/10/2020, 1:47 PM  Clinical Narrative:                  CSW received request to contact Winfield,Edward (Spouse) [phone] today 01/10/2020.  CSW spoke with Mr. Fassler and explained TOC role in patient care. Mr. Balla stated he could not understand why he was allowed to visit the patient over the weekend and now that he changed her code he was not allowed to visit her.  CSW explained that I was told by the ICU staff the reason why the visitation changed was due to the change from partial code back to full code. CSW explained that I was told the patient's could only receive visitors if they were comfort care, once the code was changed the patient was not allowed visitors until it was 21 days after COVID diagnosis.  Mr. Johannsen stated his frustration about this change bus was not upset, he wants the hospital staff to know he is aware they are working hard to take care of the patient.  Mr. Cislo stated "I don't want you to give up on her.  I don't want anyone to give up on her." Mr. Billiot inquired about getting the patient transferred to another hospital.  CSW explained that there would need to be an Attending physician in the other hospital who is willing to take on the patient. CSW explained transfers are very difficult but if the patient wanted to try he could contact the other hospitals and let this CSW know if he wanted the Attending to contact them.  Mr. Mittelman verbalized understanding. Mr. Kessenich would like regular updates from CSW on how the patient is doing that day.  CSW stated that I would not be abel to give him medical information.  Mr. Rief verbalized understanding.  Expected Discharge Plan: Home/Self Care Barriers to Discharge: Continued Medical  Work up   Patient Goals and CMS Choice        Expected Discharge Plan and Services Expected Discharge Plan: Home/Self Care In-house Referral: Clinical Social Work     Living arrangements for the past 2 months: Mobile Home                                      Prior Living Arrangements/Services Living arrangements for the past 2 months: Mobile Home Lives with:: Spouse (Boyington,Edward (Spouse) [phone]) Patient language and need for interpreter reviewed:: Yes        Need for Family Participation in Patient Care: Yes (Comment) Care giver support system in place?: Yes (comment)   Criminal Activity/Legal Involvement Pertinent to Current Situation/Hospitalization: No - Comment as needed  Activities of Daily Living Home Assistive Devices/Equipment: None ADL Screening (condition at time of admission) Patient's cognitive ability adequate to safely complete daily activities?: Yes Is the patient deaf or have difficulty hearing?: No Does the patient have difficulty seeing, even when wearing glasses/contacts?: No Does the patient have difficulty concentrating, remembering, or making decisions?: No Patient able to express need for assistance with ADLs?: Yes Does the patient have difficulty dressing or bathing?: No Independently performs ADLs?: Yes (appropriate for developmental age) Does the patient have difficulty walking or climbing stairs?: No Weakness of  Legs: None Weakness of Arms/Hands: None  Permission Sought/Granted Permission sought to share information with : Family Supports Permission granted to share information with : No (Patient intubated and sedated)  Share Information with NAME: Pennebaker,Edward (Spouse) [phone]           Emotional Assessment Appearance:: Appears stated age Attitude/Demeanor/Rapport: Unable to Assess Affect (typically observed): Unable to Assess   Alcohol / Substance Use: Not Applicable Psych Involvement: No  (comment)  Admission diagnosis:  SOB (shortness of breath) [R06.02] Hypoxia [R09.02] Acute respiratory failure due to COVID-19 (HCC) [U07.1, J96.00] Patient Active Problem List   Diagnosis Date Noted  . Acute respiratory failure due to COVID-19 (HCC) 01/01/2020  . Obesity 07/18/2014  . HTN (hypertension) 07/18/2014   PCP:  Jerl Mina, MD Pharmacy:   [facility] [id number] - [place], [place] - [address] AT [facility] OF [address] [place] [address] Phone: [phone] Fax: [phone]     Social Determinants of Health (SDOH) Interventions    Readmission Risk Interventions No flowsheet data found.

## 2020-01-11 DIAGNOSIS — U071 COVID-19: Secondary | ICD-10-CM | POA: Diagnosis not present

## 2020-01-11 DIAGNOSIS — J9600 Acute respiratory failure, unspecified whether with hypoxia or hypercapnia: Secondary | ICD-10-CM | POA: Diagnosis not present

## 2020-01-11 LAB — BASIC METABOLIC PANEL
Anion gap: 12 (ref 5–15)
BUN: 40 mg/dL — ABNORMAL HIGH (ref 6–20)
CO2: 38 mmol/L — ABNORMAL HIGH (ref 22–32)
Calcium: 8.6 mg/dL — ABNORMAL LOW (ref 8.9–10.3)
Chloride: 88 mmol/L — ABNORMAL LOW (ref 98–111)
Creatinine, Ser: 0.57 mg/dL (ref 0.44–1.00)
GFR, Estimated: >60 mL/min (ref >60)
Glucose, Bld: 161 mg/dL — ABNORMAL HIGH (ref 70–99)
Potassium: 4.5 mmol/L (ref 3.5–5.1)
Sodium: 138 mmol/L (ref 135–145)

## 2020-01-11 LAB — CBC WITH DIFFERENTIAL/PLATELET
Abs Immature Granulocytes: 1.24 10*3/uL — ABNORMAL HIGH (ref 0.00–0.07)
Basophils Absolute: 0.1 10*3/uL (ref 0.0–0.1)
Basophils Relative: 0 %
Eosinophils Absolute: 0.1 10*3/uL (ref 0.0–0.5)
Eosinophils Relative: 0 %
HCT: 36.4 % (ref 36.0–46.0)
Hemoglobin: 11.3 g/dL — ABNORMAL LOW (ref 12.0–15.0)
Immature Granulocytes: 5 %
Lymphocytes Relative: 5 %
Lymphs Abs: 1.2 10*3/uL (ref 0.7–4.0)
MCH: 29.6 pg (ref 26.0–34.0)
MCHC: 31 g/dL (ref 30.0–36.0)
MCV: 95.3 fL (ref 80.0–100.0)
Monocytes Absolute: 1.7 10*3/uL — ABNORMAL HIGH (ref 0.1–1.0)
Monocytes Relative: 7 %
Neutro Abs: 19.2 10*3/uL — ABNORMAL HIGH (ref 1.7–7.7)
Neutrophils Relative %: 83 %
Platelets: 167 10*3/uL (ref 150–400)
RBC: 3.82 MIL/uL — ABNORMAL LOW (ref 3.87–5.11)
RDW: 13.7 % (ref 11.5–15.5)
WBC: 23.6 10*3/uL — ABNORMAL HIGH (ref 4.0–10.5)
nRBC: 0.3 % — ABNORMAL HIGH (ref 0.0–0.2)

## 2020-01-11 LAB — GLUCOSE, CAPILLARY
Glucose-Capillary: 134 mg/dL — ABNORMAL HIGH (ref 70–99)
Glucose-Capillary: 141 mg/dL — ABNORMAL HIGH (ref 70–99)
Glucose-Capillary: 151 mg/dL — ABNORMAL HIGH (ref 70–99)
Glucose-Capillary: 162 mg/dL — ABNORMAL HIGH (ref 70–99)
Glucose-Capillary: 163 mg/dL — ABNORMAL HIGH (ref 70–99)
Glucose-Capillary: 196 mg/dL — ABNORMAL HIGH (ref 70–99)
Glucose-Capillary: 230 mg/dL — ABNORMAL HIGH (ref 70–99)

## 2020-01-11 LAB — HEPARIN LEVEL (UNFRACTIONATED): Heparin Unfractionated: 0.45 IU/mL (ref 0.30–0.70)

## 2020-01-11 LAB — PHOSPHORUS: Phosphorus: 5.1 mg/dL — ABNORMAL HIGH (ref 2.5–4.6)

## 2020-01-11 MED ORDER — VITAL 1.5 CAL PO LIQD
1000.0000 mL | ORAL | Status: DC
  Administered 2020-01-11 – 2020-01-19 (×9): 1000 mL

## 2020-01-11 MED ORDER — PROSOURCE TF PO LIQD
45.0000 mL | Freq: Three times a day (TID) | ORAL | Status: DC
  Administered 2020-01-12 – 2020-01-19 (×23): 45 mL
  Filled 2020-01-11: qty 45

## 2020-01-11 NOTE — Progress Notes (Signed)
ANTICOAGULATION CONSULT NOTE  Pharmacy Consult for Heparin  Indication: pulmonary embolus  Patient Measurements: Height: 5\' 6"  (167.6 cm) Weight: 120 kg (264 lb 8.8 oz) IBW/kg (Calculated) : 59.3 Heparin Dosing Weight: 89.15 kg   Labs: Recent Labs    01/09/20 0533 01/09/20 0533 01/10/20 0529 01/11/20 0508  HGB 10.7*   < > 11.4* 11.3*  HCT 33.8*  --  36.4 36.4  PLT 259  --  210 167  HEPARINUNFRC 0.37  --  0.51 0.45  CREATININE 0.51  --  0.54 0.57   < > = values in this interval not displayed.    Estimated Creatinine Clearance: 103.6 mL/min (by C-G formula based on SCr of 0.57 mg/dL).   Medical History: Past Medical History:  Diagnosis Date  . Hepatitis   . Hypertension   . Sciatica    Assessment: Patient is a 54 y/o F with medical history as above who is admitted with severe COVID-19 pneumonia with ARDS requiring intubation and mechanical ventilation. Now with concern for pulmonary embolus. Pharmacy has been consulted to initiate heparin infusion for high clinical suspicion for pulmonary embolus.   CBC notable for stable anemia  Goal of Therapy:  Heparin level 0.3-0.7 units/ml Monitor platelets by anticoagulation protocol: Yes   Plan:  --11/3 at 1435 HL = 0.24, subtherapeutic. Will order heparin 1000 unit IV bolus x 1 and increase heparin infusion to 1750 units/hr --Re-check HL 6 hours after rate change --Daily CBC per protocol  11/3:  HL @ 2105 = 0.37 Will continue pt on current rate and draw confirmation level in 6 hrs on 11/4 @ 0300.   11/4: HL @ 0300 = 0.37 Will continue pt on current rate and recheck HL on 11/5 with AM labs.   11/5: HL @ 0410 = 0.37 Will continue pt on current rate and recheck HL on 11/6 with AM labs.   11/6: HL @ 0458 = 0.41 Will continue pt on current rate and recheck HL on 11/7 with AM labs.   11/7: HL @ 0533 = 0.37 Will continue pt on current rate and recheck HL on 11/8 with AM labs.   11/8: HL @ 0529 = 0.51 Will continue pt  on current rate and recheck HL on 11/9 with AM labs.   11/9: HL @ 0508 0.45, therapeutic, CBC stable Will continue Heparin at current rate and recheck HL and CBC in am  13/9 A 01/11/2020,6:21 AM

## 2020-01-11 NOTE — Progress Notes (Addendum)
CRITICAL CARE NOTE 54 year old remote former smoker, with no chronic significant medical issues other than obesity and hypertension, who presents for evaluation of shortness of breath over the last week. The patient states that approximately 6 weeks ago she developed a dry hacking cough which is not unusual for her during "allergy season and change of season". However, approximately a week ago she noted fever and congestion and feeling significantly short of breath. Despite this she went to have her annual medical exam done and had a flu vaccine given at that time. She not vaccinated against COVID-19. She has had some chest discomfort when coughing but not at any other time. No orthopnea or paroxysmal nocturnal dyspnea until last night. Noted that she could not lay flat to sleep. He has not had any abdominal pain, no nausea, vomiting or bowel habit disruption. She has had mild to moderate headache for the last week. Cough became almost constant a day ago which prompted her to come to the emergency room today. Aside from this she voices no other complaint.  She was evaluated at the emergency room where she was noted to be tachypneic and have a temperature of 101.6 F on arrival. She also was noted to have oxygen saturations of 56% on room air. She was placed on heated high flow O2 and nonrebreather mask and is now saturating 100%. PCCM has been asked to admit the patient to stepdown/ICU.  MICRO Covid PCR January 13, 2020 = Positiver [flu PCR negative] Blood culture January 13, 2020-negative Urine culture 12/27/2019-less than 10,000 colonies insignificant growth Blood culture 01/02/2020-negative Respiratory culture tracheal aspirate 01/03/2020 -rare MSSA MRSA PCR 01/04/2020-negative  ANTIBIOTICS Remdesivir 13-Jan-2020-January 13, 2020 Cefepime 12/27/2019-12/27/2019 Cefepime 01/02/2020-01/06/2020 Cefazolin 01/06/2020- x 7 days  10/24 severe hypoxia. COVID Positivie 10/25 intubation and MV support and severe  hypoxia, ETT tube exchanged 10/26-10/31 severe hypoxia and severe ARDS 10/30 - 2h of nimbex and  10/31 early hours - 24h of vec 11/1 high risk for cardiac arrest and death. MSSA on Trch aspiiate 2023-01-23 severe ARDS, s/p proning.  R calf DVT  -started on heparin gtt 11/4 severe hypoxia-did NOT tolerate proning. ECHO 0 normal LV and RV 11/5 - 75% fio32, high peep. Not on pressor.SUpine ventilation. Prn vec. Making urine. On dilaudid gtt and versed gtt. Per Pharmacy got 2h of nimbex and then 24h of continuous on 10/30 - 10/31. +8L since admit. Heparn gtt contiues  01/08/2020- desaturatiion overnight with worsening PF ratio. REdness in neck after niimbex. Now Prone since 0.30 today . On vec gtt with sedation gtt. Fio2 100% , peep 16 -> pulse ox 88%.. Not on perssors. Making urine. Diuresing well. Down t to +4L volume overload since admit. LITD CODe 11/8 remains very sick, severe ARDS, FULL CODE, husband updated   CC  follow up respiratory failure  SUBJECTIVE Patient remains critically ill Prognosis is guarded Severe ARDS  Vent Mode: PRVC FiO2 (%):  [100 %] 100 % Set Rate:  [30 bmp] 30 bmp Vt Set:  [400 mL] 400 mL PEEP:  [16 cmH20] 16 cmH20 CBC    Component Value Date/Time   WBC 23.6 (H) 01/11/2020 0508   RBC 3.82 (L) 01/11/2020 0508   HGB 11.3 (L) 01/11/2020 0508   HCT 36.4 01/11/2020 0508   PLT 167 01/11/2020 0508   MCV 95.3 01/11/2020 0508   MCH 29.6 01/11/2020 0508   MCHC 31.0 01/11/2020 0508   RDW 13.7 01/11/2020 0508   LYMPHSABS 1.2 01/11/2020 0508   MONOABS 1.7 (H) 01/11/2020 0508   EOSABS 0.1 01/11/2020  0508   BASOSABS 0.1 01/11/2020 0508   BMP Latest Ref Rng & Units 01/11/2020 01/10/2020 01/09/2020  Glucose 70 - 99 mg/dL 350(K) 938(H) 829(H)  BUN 6 - 20 mg/dL 37(J) 69(C) 78(L)  Creatinine 0.44 - 1.00 mg/dL 3.81 0.17 5.10  BUN/Creat Ratio 9 - 23 - - -  Sodium 135 - 145 mmol/L 138 138 138  Potassium 3.5 - 5.1 mmol/L 4.5 4.6 4.7  Chloride 98 - 111 mmol/L 88(L) 84(L) 89(L)   CO2 22 - 32 mmol/L 38(H) 44(H) 41(H)  Calcium 8.9 - 10.3 mg/dL 2.5(E) 5.2(D) 7.8(E)      BP 114/70   Pulse 94   Temp 100.2 F (37.9 C)   Resp 17   Ht 5\' 6"  (1.676 m)   Wt 120 kg   LMP 08/16/2014   SpO2 95%   BMI 42.70 kg/m    I/O last 3 completed shifts: In: 2856.7 [I.V.:1730; NG/GT:627; IV Piggyback:499.7] Out: 5015 [Urine:5015] No intake/output data recorded.  SpO2: 95 % O2 Flow Rate (L/min): 0 L/min (no flow data to report) FiO2 (%): 100 %  Estimated body mass index is 42.7 kg/m as calculated from the following:   Height as of this encounter: 5\' 6"  (1.676 m).   Weight as of this encounter: 120 kg.  SIGNIFICANT EVENTS   REVIEW OF SYSTEMS  PATIENT IS UNABLE TO PROVIDE COMPLETE REVIEW OF SYSTEMS DUE TO SEVERE CRITICAL ILLNESS      COVID-19 DISASTER DECLARATION:   FULL CONTACT PHYSICAL EXAMINATION WAS NOT POSSIBLE DUE TO TREATMENT OF COVID-19  AND CONSERVATION OF PERSONAL PROTECTIVE EQUIPMENT, LIMITED EXAM FINDINGS INCLUDE-   PHYSICAL EXAMINATION:  GENERAL:critically ill appearing, +resp distress NEUROLOGIC: obtunded, GCS<8   Patient assessed or the symptoms described in the history of present illness.  In the context of the Global COVID-19 pandemic, which necessitated consideration that the patient might be at risk for infection with the SARS-CoV-2 virus that causes COVID-19, Institutional protocols and algorithms that pertain to the evaluation of patients at risk for COVID-19 are in a state of rapid change based on information released by regulatory bodies including the CDC and federal and state organizations. These policies and algorithms were followed during the patient's care while in hospital.    MEDICATIONS: I have reviewed all medications and confirmed regimen as documented   CULTURE RESULTS   Recent Results (from the past 240 hour(s))  Culture, blood (single) w Reflex to ID Panel     Status: None   Collection Time: 01/02/20  7:10 PM    Specimen: BLOOD  Result Value Ref Range Status   Specimen Description BLOOD BLOOD LEFT HAND  Final   Special Requests   Final    BOTTLES DRAWN AEROBIC AND ANAEROBIC Blood Culture adequate volume   Culture   Final    NO GROWTH 5 DAYS Performed at [facility], [address], [place], [address] [place]    Report Status 01/07/2020 FINAL  Final  Culture, respiratory (non-expectorated)     Status: None   Collection Time: 01/03/20  9:00 AM   Specimen: Tracheal Aspirate; Respiratory  Result Value Ref Range Status   Specimen Description   Final    TRACHEAL ASPIRATE Performed at [facility], [address] [place], [address] [place]    Special Requests   Final    NONE Performed at [facility], [address] [place], [address] [place]    Gram Stain   Final    MODERATE WBC PRESENT, PREDOMINANTLY PMN FEW  GRAM POSITIVE COCCI Performed at [facility], [address] [place], [place] [address]    Culture RARE STAPHYLOCOCCUS AUREUS  Final   Report Status 01/06/2020 FINAL  Final   Organism ID, Bacteria STAPHYLOCOCCUS AUREUS  Final      Susceptibility   Staphylococcus aureus - MIC*    CIPROFLOXACIN <=0.5 SENSITIVE Sensitive     ERYTHROMYCIN <=0.25 SENSITIVE Sensitive     GENTAMICIN <=0.5 SENSITIVE Sensitive     OXACILLIN <=0.25 SENSITIVE Sensitive     TETRACYCLINE <=1 SENSITIVE Sensitive     VANCOMYCIN <=0.5 SENSITIVE Sensitive     TRIMETH/SULFA <=10 SENSITIVE Sensitive     CLINDAMYCIN <=0.25 SENSITIVE Sensitive     RIFAMPIN <=0.5 SENSITIVE Sensitive     Inducible Clindamycin NEGATIVE Sensitive     * RARE STAPHYLOCOCCUS AUREUS  MRSA PCR Screening     Status: None   Collection Time: 01/04/20  1:42 PM   Specimen: Nasal Mucosa; Nasopharyngeal  Result Value Ref Range Status   MRSA by PCR NEGATIVE NEGATIVE Final    Comment:        The GeneXpert MRSA Assay (FDA approved for NASAL specimens only), is one component of a comprehensive MRSA  colonization surveillance program. It is not intended to diagnose MRSA infection nor to guide or monitor treatment for MRSA infections. Performed at [facility], [address], [place], [place] [address]           IMAGING    No results found.   Nutrition Status: Nutrition Problem: Inadequate oral intake Etiology: inability to eat Signs/Symptoms: NPO status Interventions: Tube feeding, Prostat, MVI     Indwelling Urinary Catheter continued, requirement due to   Reason to continue Indwelling Urinary Catheter strict Intake/Output monitoring for hemodynamic instability   Central Line/ continued, requirement due to  Reason to continue [employer] Monitoring of central venous pressure or other hemodynamic parameters and poor IV access   Ventilator continued, requirement due to severe respiratory failure   Ventilator Sedation RASS 0 to -2      ASSESSMENT AND PLAN SYNOPSIS  Admitted for COVID 19 pneumonia Acute hypoxemic respiratory failure due to COVID-19 pneumonia / ARDS Mechanical ventilation via ARDS protocol, target PRVC 6 cc/kg Wean PEEP and FiO2 as able Goal plateau pressure less than 30, driving pressure less than 15 Paralytics if necessary for vent synchrony, gas exchange Cycle prone positioning if necessary for oxygenation Deep sedation per PAD protocol, goal RASS -4, currently fentanyl, midazolam Diuresis as blood pressure and renal function can tolerate, goal CVP 5-8.   diuresis as tolerated based on Kidney function VAP prevention order set Remdesivir  IV STEROIDS   Severe ACUTE Hypoxic and Hypercapnic Respiratory Failure -continue Full MV support -continue Bronchodilator Therapy -Wean Fio2 and PEEP as tolerated -VAP/VENT bundle implementation Will need TRACH for SURVIVAL  ACUTE DIASTOLIC CARDIAC FAILURE-  -Lasix as tolerated   +DVT Continue heparin drip  Morbid obesity, possible OSA.   Will certainly impact respiratory  mechanics, ventilator weaning Suspect will need to consider additional PEEP   NEUROLOGY Acute toxic metabolic encephalopathy, need for sedation Goal RASS -2 to -3  CARDIAC ICU monitoring  ID -continue IV abx as prescibed -follow up cultures  GI GI PROPHYLAXIS as indicated   DIET-->TF's as tolerated Constipation protocol as indicated  ENDO - will use ICU hypoglycemic\Hyperglycemia protocol if indicated     ELECTROLYTES -follow labs as needed -replace as needed -pharmacy consultation and following   DVT/GI PRX ordered and assessed TRANSFUSIONS AS  NEEDED MONITOR FSBS I Assessed the need for Labs I Assessed the need for Foley I Assessed the need for Central Venous Line Family Discussion when available I Assessed the need for Mobilization I made an Assessment of medications to be adjusted accordingly Safety Risk assessment completed   CASE DISCUSSED IN MULTIDISCIPLINARY ROUNDS WITH ICU TEAM  Critical Care Time devoted to patient care services described in this note is 56 minutes.   Overall, patient is critically ill, prognosis is guarded.  Patient with Multiorgan failure and at high risk for cardiac arrest and death.    Lucie Leather, M.D.  [facility]  Medical Director [facility] Medical Director [facility] Cardio-Pulmonary Department

## 2020-01-11 NOTE — Progress Notes (Signed)
Nutrition Follow-up  DOCUMENTATION CODES:   Morbid obesity  INTERVENTION:  Initiate new goal TF regimen of Vital 1.5 Cal at 70 mL/hr (1680 mL goal daily volume) + PROSource TF 45 mL TID per tube. Provides 2640 kcal, 146 grams of protein, 1277 mL H2O daily.  Goal TF regimen meets 100% RDIs for vitamins/minerals.  NUTRITION DIAGNOSIS:   Inadequate oral intake related to inability to eat as evidenced by NPO status.  Ongoing.  GOAL:   Patient will meet greater than or equal to 90% of their needs  Met with TF regimen.  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  10/25 intubated 10/26 ETT replaced due to blown cuff  Patient is currently intubated on ventilator support MV: 11.5 L/min Temp (24hrs), Avg:99.8 F (37.7 C), Min:99 F (37.2 C), Max:101.1 F (38.4 C)  Medications reviewed and include: free water 30 mL Q4hrs, Novolog 0-20 units Q4hrs, Solu-Medrol 20 mg BID IV, MVI daily, Protonix, cefazolin, heparin infusion, Dilaudid gtt, Versed gtt.  Labs reviewed: CBG 141-196, Chloride 88, CO2 38, BUN 40, Phosphorus 5.1.  I/O: 3165 mL UOP yesterday (1.1 mL/kg/hr)  Weight trend: 120 kg on 11/9; +0.2 kg from 10/26  Enteral Access: 18 Fr. OGT placed 10/25; extends into stomach still per chest x-ray 11/7  TF regimen: Vital 1.5 Cal at 55 mL/hr + PROSource TF 90 mL TID  Discussed with RN and on rounds.  Diet Order:   Diet Order            Diet NPO time specified  Diet effective now                EDUCATION NEEDS:   No education needs have been identified at this time  Skin:  Skin Assessment: Reviewed RN Assessment  Last BM:  01/10/2020 - small type 6  Height:   Ht Readings from Last 1 Encounters:  12/10/2019 _0  (1.676 m)    Weight:   Wt Readings from Last 1 Encounters:  01/11/20 120 kg   Ideal Body Weight:  59.1 kg  BMI:  Body mass index is 42.7 kg/m.  Estimated Nutritional Needs:   Kcal:  2679  Protein:  140-150 grams  Fluid:  >/= 2 L/day  Jacklynn Barnacle, MS, RD, LDN Pager number available on Amion

## 2020-01-11 NOTE — Progress Notes (Signed)
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 Patient is currently intubated, sedated, and on mechanical ventilation.   Nutrition: Tube feeds + free water (180 mL/day) Diuretics: IV Lasix (d/c 11/8) switched to acetazolamide    Goal of Therapy:  Electrolytes WNL  Plan:   No electrolyte replacement indicated today  Will follow peripherally  Pricilla Riffle, PharmD 01/11/2020 2:45 PM

## 2020-01-12 ENCOUNTER — Inpatient Hospital Stay: Payer: BC Managed Care – PPO

## 2020-01-12 LAB — COMPREHENSIVE METABOLIC PANEL
ALT: 39 U/L (ref 0–44)
AST: 28 U/L (ref 15–41)
Albumin: 2.4 g/dL — ABNORMAL LOW (ref 3.5–5.0)
Alkaline Phosphatase: 50 U/L (ref 38–126)
Anion gap: 10 (ref 5–15)
BUN: 34 mg/dL — ABNORMAL HIGH (ref 6–20)
CO2: 35 mmol/L — ABNORMAL HIGH (ref 22–32)
Calcium: 8.7 mg/dL — ABNORMAL LOW (ref 8.9–10.3)
Chloride: 91 mmol/L — ABNORMAL LOW (ref 98–111)
Creatinine, Ser: 0.42 mg/dL — ABNORMAL LOW (ref 0.44–1.00)
GFR, Estimated: >60 mL/min (ref >60)
Glucose, Bld: 206 mg/dL — ABNORMAL HIGH (ref 70–99)
Potassium: 4.3 mmol/L (ref 3.5–5.1)
Sodium: 136 mmol/L (ref 135–145)
Total Bilirubin: 0.6 mg/dL (ref 0.3–1.2)
Total Protein: 6.6 g/dL (ref 6.5–8.1)

## 2020-01-12 LAB — PHOSPHORUS: Phosphorus: 3.3 mg/dL (ref 2.5–4.6)

## 2020-01-12 LAB — CBC WITH DIFFERENTIAL/PLATELET
Abs Immature Granulocytes: 1.14 10*3/uL — ABNORMAL HIGH (ref 0.00–0.07)
Basophils Absolute: 0.1 10*3/uL (ref 0.0–0.1)
Basophils Relative: 0 %
Eosinophils Absolute: 0 10*3/uL (ref 0.0–0.5)
Eosinophils Relative: 0 %
HCT: 33.4 % — ABNORMAL LOW (ref 36.0–46.0)
Hemoglobin: 10.5 g/dL — ABNORMAL LOW (ref 12.0–15.0)
Immature Granulocytes: 5 %
Lymphocytes Relative: 5 %
Lymphs Abs: 1.2 10*3/uL (ref 0.7–4.0)
MCH: 29.6 pg (ref 26.0–34.0)
MCHC: 31.4 g/dL (ref 30.0–36.0)
MCV: 94.1 fL (ref 80.0–100.0)
Monocytes Absolute: 1.1 10*3/uL — ABNORMAL HIGH (ref 0.1–1.0)
Monocytes Relative: 5 %
Neutro Abs: 19.5 10*3/uL — ABNORMAL HIGH (ref 1.7–7.7)
Neutrophils Relative %: 85 %
Platelets: 133 10*3/uL — ABNORMAL LOW (ref 150–400)
RBC: 3.55 MIL/uL — ABNORMAL LOW (ref 3.87–5.11)
RDW: 13.3 % (ref 11.5–15.5)
WBC: 23.1 10*3/uL — ABNORMAL HIGH (ref 4.0–10.5)
nRBC: 0.3 % — ABNORMAL HIGH (ref 0.0–0.2)

## 2020-01-12 LAB — GLUCOSE, CAPILLARY
Glucose-Capillary: 196 mg/dL — ABNORMAL HIGH (ref 70–99)
Glucose-Capillary: 188 mg/dL — ABNORMAL HIGH (ref 70–99)
Glucose-Capillary: 156 mg/dL — ABNORMAL HIGH (ref 70–99)
Glucose-Capillary: 186 mg/dL — ABNORMAL HIGH (ref 70–99)
Glucose-Capillary: 169 mg/dL — ABNORMAL HIGH (ref 70–99)

## 2020-01-12 LAB — MAGNESIUM: Magnesium: 2.3 mg/dL (ref 1.7–2.4)

## 2020-01-12 LAB — BLOOD GAS, ARTERIAL

## 2020-01-12 LAB — HEPARIN LEVEL (UNFRACTIONATED): Heparin Unfractionated: 0.39 IU/mL (ref 0.30–0.70)

## 2020-01-12 MED ORDER — FUROSEMIDE 10 MG/ML IJ SOLN
40.0000 mg | Freq: Once | INTRAMUSCULAR | Status: AC
  Administered 2020-01-12: 40 mg via INTRAVENOUS
  Filled 2020-01-12: qty 4

## 2020-01-12 MED ORDER — PANTOPRAZOLE SODIUM 40 MG PO PACK
40.0000 mg | PACK | Freq: Two times a day (BID) | ORAL | Status: DC
  Administered 2020-01-12 – 2020-01-19 (×14): 40 mg
  Filled 2020-01-12 (×14): qty 20

## 2020-01-12 NOTE — TOC Progression Note (Signed)
Transition of Care [facility]) - Progression Note    Patient Details  Name: Carrie Davies MRN: [id number] Date of Birth: 1965/04/08  Transition of Care [facility]) CM/SW Contact  Marina Goodell Phone Number: [phone] 01/12/2020, 4:11 PM  Clinical Narrative:     CSW received message from [facility] [phone], requesting update on patient status.  CSW called and left voicemail, requesting a return call.   Expected Discharge Plan: Home/Self Care Barriers to Discharge: Continued Medical Work up  Expected Discharge Plan and Services Expected Discharge Plan: Home/Self Care In-house Referral: Clinical Social Work     Living arrangements for the past 2 months: Mobile Home                                       Social Determinants of Health (SDOH) Interventions    Readmission Risk Interventions No flowsheet data found.

## 2020-01-12 NOTE — Plan of Care (Signed)
Pt continues on fiO2 of 100%. Husband updated frequently throughout day. Pt tolerating current vent settings and discontinuation of paralytic gtt, however remains in severe ARDS.

## 2020-01-12 NOTE — Progress Notes (Signed)
CRITICAL CARE NOTE 54 year old remote former smoker, with no chronic significant medical issues other than obesity and hypertension, who presents for evaluation of shortness of breath over the last week. The patient states that approximately 6 weeks ago she developed a dry hacking cough which is not unusual for her during "allergy season and change of season". However, approximately a week ago she noted fever and congestion and feeling significantly short of breath. Despite this she went to have her annual medical exam done and had a flu vaccine given at that time. She not vaccinated against COVID-19. She has had some chest discomfort when coughing but not at any other time. No orthopnea or paroxysmal nocturnal dyspnea until last night. Noted that she could not lay flat to sleep. He has not had any abdominal pain, no nausea, vomiting or bowel habit disruption. She has had mild to moderate headache for the last week. Cough became almost constant a day ago which prompted her to come to the emergency room today. Aside from this she voices no other complaint.  She was evaluated at the emergency room where she was noted to be tachypneic and have a temperature of 101.6 F on arrival. She also was noted to have oxygen saturations of 56% on room air. She was placed on heated high flow O2 and nonrebreather mask and is now saturating 100%. PCCM has been asked to admit the patient to stepdown/ICU.  MICRO Covid PCR 01/02/2020 = Positiver [flu PCR negative] Blood culture 2020-01-02-negative Urine culture 12/27/2019-less than 10,000 colonies insignificant growth Blood culture 01/02/2020-negative Respiratory culture tracheal aspirate 01/03/2020 -rare MSSA MRSA PCR 01/04/2020-negative  ANTIBIOTICS Remdesivir 01/02/2020-2020/01/02 Cefepime 12/27/2019-12/27/2019 Cefepime 01/02/2020-01/06/2020 Cefazolin 01/06/2020- x 7 days  10/24 severe hypoxia. COVID Positivie 10/25 intubation and MV support and severe  hypoxia, ETT tube exchanged 10/26-10/31 severe hypoxia and severe ARDS 10/30 - 2h of nimbex and  10/31 early hours - 24h of vec 11/1 high risk for cardiac arrest and death. MSSA on Trch aspiiate 2023/01/12 severe ARDS, s/p proning.  R calf DVT  -started on heparin gtt 11/4 severe hypoxia-did NOT tolerate proning. ECHO 0 normal LV and RV 11/5 - 75% fio32, high peep. Not on pressor.SUpine ventilation. Prn vec. Making urine. On dilaudid gtt and versed gtt. Per Pharmacy got 2h of nimbex and then 24h of continuous on 10/30 - 10/31. +8L since admit. Heparn gtt contiues  01/08/2020- desaturatiion overnight with worsening PF ratio. REdness in neck after niimbex. Now Prone since 0.30 today . On vec gtt with sedation gtt. Fio2 100% , peep 16 -> pulse ox 88%.. Not on perssors. Making urine. Diuresing well. Down t to +4L volume overload since admit. LITD CODe 11/8 remains very sick, severe ARDS, FULL CODE, husband updated 11/10- patient had not tolerated proning. She is making urine. CXR with pulm edema, will diurese and monitor PICC CVP trend only.   CC  follow up respiratory failure  SUBJECTIVE Patient remains critically ill Prognosis is guarded Severe ARDS  Vent Mode: PRVC FiO2 (%):  [100 %] 100 % Set Rate:  [30 bmp] 30 bmp Vt Set:  [400 mL] 400 mL PEEP:  [16 cmH20] 16 cmH20 CBC    Component Value Date/Time   WBC 23.1 (H) 01/12/2020 0634   RBC 3.55 (L) 01/12/2020 0634   HGB 10.5 (L) 01/12/2020 0634   HCT 33.4 (L) 01/12/2020 0634   PLT 133 (L) 01/12/2020 0634   MCV 94.1 01/12/2020 0634   MCH 29.6 01/12/2020 0634   MCHC 31.4 01/12/2020 1157  RDW 13.3 01/12/2020 0634   LYMPHSABS 1.2 01/12/2020 0634   MONOABS 1.1 (H) 01/12/2020 0634   EOSABS 0.0 01/12/2020 0634   BASOSABS 0.1 01/12/2020 0634   BMP Latest Ref Rng & Units 01/12/2020 01/11/2020 01/10/2020  Glucose 70 - 99 mg/dL 852(D) 782(U) 235(T)  BUN 6 - 20 mg/dL 61(W) 43(X) 54(M)  Creatinine 0.44 - 1.00 mg/dL 0.86(P) 6.19 5.09  BUN/Creat  Ratio 9 - 23 - - -  Sodium 135 - 145 mmol/L 136 138 138  Potassium 3.5 - 5.1 mmol/L 4.3 4.5 4.6  Chloride 98 - 111 mmol/L 91(L) 88(L) 84(L)  CO2 22 - 32 mmol/L 35(H) 38(H) 44(H)  Calcium 8.9 - 10.3 mg/dL 3.2(I) 7.1(I) 4.5(Y)      BP 122/67   Pulse 86   Temp 99 F (37.2 C)   Resp 13   Ht 5\' 6"  (1.676 m)   Wt 117 kg   LMP 08/16/2014   SpO2 92%   BMI 41.63 kg/m    I/O last 3 completed shifts: In: 6809.2 [I.V.:1455; Other:100; 01-14-2006; IV Piggyback:497.7] Out: 3650 [Urine:3650] Total I/O In: -  Out: 225 [Urine:225]  SpO2: 92 % O2 Flow Rate (L/min): 0 L/min (no flow data to report) FiO2 (%): 100 %  Estimated body mass index is 41.63 kg/m as calculated from the following:   Height as of this encounter: 5\' 6"  (1.676 m).   Weight as of this encounter: 117 kg.  SIGNIFICANT EVENTS   REVIEW OF SYSTEMS  PATIENT IS UNABLE TO PROVIDE COMPLETE REVIEW OF SYSTEMS DUE TO SEVERE CRITICAL ILLNESS    PHYSICAL EXAMINATION:  GENERAL:critically ill appearing, +resp distress HEENT - neck supple no JVD mouth with moist mucosa RESPIRATORY - ronchi b/l  CARDIOVASCULAR - NSR no MRG appreciated GI - +bs x4, obese abdomen Musculoskeletal - 1+ edema  NEUROLOGIC: obtunded, GCS<8   MEDICATIONS: I have reviewed all medications and confirmed regimen as documented   CULTURE RESULTS   Recent Results (from the past 240 hour(s))  Culture, blood (single) w Reflex to ID Panel     Status: None   Collection Time: 01/02/20  7:10 PM   Specimen: BLOOD  Result Value Ref Range Status   Specimen Description BLOOD BLOOD LEFT HAND  Final   Special Requests   Final    BOTTLES DRAWN AEROBIC AND ANAEROBIC Blood Culture adequate volume   Culture   Final    NO GROWTH 5 DAYS Performed at [facility], [address], [place], [address] [place]    Report Status 01/07/2020 FINAL  Final  Culture, respiratory (non-expectorated)     Status: None   Collection Time: 01/03/20  9:00 AM    Specimen: Tracheal Aspirate; Respiratory  Result Value Ref Range Status   Specimen Description   Final    TRACHEAL ASPIRATE Performed at [facility], [address] [place], [address] [place]    Special Requests   Final    NONE Performed at [facility], [address] [place], [address] [place]    Gram Stain   Final    MODERATE WBC PRESENT, PREDOMINANTLY PMN FEW GRAM POSITIVE COCCI Performed at [facility], [address] [place], [address] [place]    Culture RARE STAPHYLOCOCCUS AUREUS  Final   Report Status 01/06/2020 FINAL  Final   Organism ID, Bacteria STAPHYLOCOCCUS AUREUS  Final      Susceptibility   Staphylococcus aureus - MIC*    CIPROFLOXACIN <=0.5 SENSITIVE Sensitive     ERYTHROMYCIN <=  0.25 SENSITIVE Sensitive     GENTAMICIN <=0.5 SENSITIVE Sensitive     OXACILLIN <=0.25 SENSITIVE Sensitive     TETRACYCLINE <=1 SENSITIVE Sensitive     VANCOMYCIN <=0.5 SENSITIVE Sensitive     TRIMETH/SULFA <=10 SENSITIVE Sensitive     CLINDAMYCIN <=0.25 SENSITIVE Sensitive     RIFAMPIN <=0.5 SENSITIVE Sensitive     Inducible Clindamycin NEGATIVE Sensitive     * RARE STAPHYLOCOCCUS AUREUS  MRSA PCR Screening     Status: None   Collection Time: 01/04/20  1:42 PM   Specimen: Nasal Mucosa; Nasopharyngeal  Result Value Ref Range Status   MRSA by PCR NEGATIVE NEGATIVE Final    Comment:        The GeneXpert MRSA Assay (FDA approved for NASAL specimens only), is one component of a comprehensive MRSA colonization surveillance program. It is not intended to diagnose MRSA infection nor to guide or monitor treatment for MRSA infections. Performed at [facility], [address], [place], [place] [address]           IMAGING    DG Chest Port 1 View  Result Date: 01/12/2020 CLINICAL DATA:  Respiratory failure. EXAM: PORTABLE CHEST 1 VIEW COMPARISON:  01/10/2020 FINDINGS: The endotracheal tube, NG tube and right PICC line are stable.  Persistent diffuse interstitial and airspace process in the lungs. No pleural effusion or pneumothorax. IMPRESSION: 1. Stable support apparatus. 2. Persistent diffuse interstitial and airspace process. Electronically Signed   By: Rudie Meyer M.D.   On: 01/12/2020 06:39     Nutrition Status: Nutrition Problem: Inadequate oral intake Etiology: inability to eat Signs/Symptoms: NPO status Interventions: Tube feeding, Prostat, MVI     Indwelling Urinary Catheter continued, requirement due to   Reason to continue Indwelling Urinary Catheter strict Intake/Output monitoring for hemodynamic instability   Central Line/ continued, requirement due to  Reason to continue [employer] Monitoring of central venous pressure or other hemodynamic parameters and poor IV access   Ventilator continued, requirement due to severe respiratory failure   Ventilator Sedation RASS 0 to -2      ASSESSMENT AND PLAN SYNOPSIS   Acute hypoxemic respiratory failure due to COVID-19 pneumonia / ARDS Mechanical ventilation via ARDS protocol, target PRVC 6 cc/kg Wean PEEP and FiO2 as able Goal plateau pressure less than 30, driving pressure less than 15 Paralytics if necessary for vent synchrony, gas exchange Cycle prone positioning if necessary for oxygenation Deep sedation per PAD protocol, goal RASS -4, currently fentanyl, midazolam Diuresis as blood pressure and renal function can tolerate, goal CVP 5-8.   diuresis as tolerated based on Kidney function VAP prevention order set Remdesivir  01/12/20- PICC CVP trending, goal diuresis with 1L/24h.  When FIo2 <85% re-attempt proning.  DC diamox and instead increase minute ventilation from 10.5>>15 to treat hyercapnia and resultant alkalemia    ACUTE DIASTOLIC CARDIAC FAILURE-  -Lasix as tolerated   +DVT Continue heparin drip  Morbid obesity, possible OSA.   Will certainly impact respiratory mechanics, ventilator weaning Suspect will need to consider  additional PEEP   NEUROLOGY Acute toxic metabolic encephalopathy, need for sedation Goal RASS -2 to -3  CARDIAC ICU monitoring  ID -continue IV abx as prescibed -follow up cultures  GI GI PROPHYLAXIS as indicated   DIET-->TF's as tolerated Constipation protocol as indicated  ENDO - will use ICU hypoglycemic\Hyperglycemia protocol if indicated     ELECTROLYTES -follow labs as needed -replace as needed -pharmacy consultation and following   DVT/GI PRX ordered and  assessed TRANSFUSIONS AS NEEDED MONITOR FSBS I Assessed the need for Labs I Assessed the need for Foley I Assessed the need for Central Venous Line Family Discussion when available I Assessed the need for Mobilization I made an Assessment of medications to be adjusted accordingly Safety Risk assessment completed   CASE DISCUSSED IN MULTIDISCIPLINARY ROUNDS WITH ICU TEAM  Critical Care Time devoted to patient care services described in this note is 33 minutes.   Overall, patient is critically ill, prognosis is guarded.  Patient with Multiorgan failure and at high risk for cardiac arrest and death.     Vida Rigger, M.D.  Pulmonary & Critical Care Medicine  [facility]

## 2020-01-12 NOTE — Progress Notes (Signed)
ANTICOAGULATION CONSULT NOTE  Pharmacy Consult for Heparin  Indication: pulmonary embolus  Patient Measurements: Height: 5\' 6"  (167.6 cm) Weight: 117 kg (257 lb 15 oz) IBW/kg (Calculated) : 59.3 Heparin Dosing Weight: 89.15 kg   Labs: Recent Labs    01/10/20 0529 01/10/20 0529 01/11/20 0508 01/12/20 0634  HGB 11.4*   < > 11.3* 10.5*  HCT 36.4  --  36.4 33.4*  PLT 210  --  167 133*  HEPARINUNFRC 0.51  --  0.45 0.39  CREATININE 0.54  --  0.57 0.42*   < > = values in this interval not displayed.    Estimated Creatinine Clearance: 102.1 mL/min (A) (by C-G formula based on SCr of 0.42 mg/dL (L)).   Medical History: Past Medical History:  Diagnosis Date  . Hepatitis   . Hypertension   . Sciatica    Assessment: Patient is a 54 y/o F with medical history as above who is admitted with severe COVID-19 pneumonia with ARDS requiring intubation and mechanical ventilation. Now with concern for pulmonary embolus. Pharmacy has been consulted to initiate heparin infusion for high clinical suspicion for pulmonary embolus.    Goal of Therapy:  Heparin level 0.3-0.7 units/ml Monitor platelets by anticoagulation protocol: Yes   Plan:  Heparin level remains therapeutic for several days. Will continue heparin drip at 1750 units/hr and check HL and CBC daily with morning labs.  57, PharmD 01/12/2020,8:37 AM

## 2020-01-12 NOTE — Progress Notes (Signed)
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 Patient is currently intubated, sedated, and on mechanical ventilation.   Nutrition: Tube feeds + free water (180 mL/day) Diuretics: IV Lasix (d/c 11/8) switched to acetazolamide    Goal of Therapy:  Electrolytes WNL  Plan:   No electrolyte replacement indicated today  Will follow peripherally  Pricilla Riffle, PharmD 01/12/2020 8:45 AM

## 2020-01-12 NOTE — Progress Notes (Signed)
Pt keys, wallet, phone, purse given to husband.

## 2020-01-13 ENCOUNTER — Inpatient Hospital Stay: Payer: BC Managed Care – PPO

## 2020-01-13 ENCOUNTER — Inpatient Hospital Stay: Payer: Self-pay

## 2020-01-13 LAB — CBC WITH DIFFERENTIAL/PLATELET
Abs Immature Granulocytes: 1.27 10*3/uL — ABNORMAL HIGH (ref 0.00–0.07)
Basophils Absolute: 0.1 10*3/uL (ref 0.0–0.1)
Basophils Relative: 0 %
Eosinophils Absolute: 0 10*3/uL (ref 0.0–0.5)
Eosinophils Relative: 0 %
HCT: 33.8 % — ABNORMAL LOW (ref 36.0–46.0)
Hemoglobin: 10.5 g/dL — ABNORMAL LOW (ref 12.0–15.0)
Immature Granulocytes: 6 %
Lymphocytes Relative: 5 %
Lymphs Abs: 1.2 10*3/uL (ref 0.7–4.0)
MCH: 29.5 pg (ref 26.0–34.0)
MCHC: 31.1 g/dL (ref 30.0–36.0)
MCV: 94.9 fL (ref 80.0–100.0)
Monocytes Absolute: 1.2 10*3/uL — ABNORMAL HIGH (ref 0.1–1.0)
Monocytes Relative: 5 %
Neutro Abs: 18.8 10*3/uL — ABNORMAL HIGH (ref 1.7–7.7)
Neutrophils Relative %: 84 %
Platelets: 129 10*3/uL — ABNORMAL LOW (ref 150–400)
RBC: 3.56 MIL/uL — ABNORMAL LOW (ref 3.87–5.11)
RDW: 13.9 % (ref 11.5–15.5)
Smear Review: NORMAL
WBC: 22.5 10*3/uL — ABNORMAL HIGH (ref 4.0–10.5)
nRBC: 0.4 % — ABNORMAL HIGH (ref 0.0–0.2)

## 2020-01-13 LAB — GLUCOSE, CAPILLARY
Glucose-Capillary: 155 mg/dL — ABNORMAL HIGH (ref 70–99)
Glucose-Capillary: 158 mg/dL — ABNORMAL HIGH (ref 70–99)
Glucose-Capillary: 169 mg/dL — ABNORMAL HIGH (ref 70–99)
Glucose-Capillary: 183 mg/dL — ABNORMAL HIGH (ref 70–99)
Glucose-Capillary: 188 mg/dL — ABNORMAL HIGH (ref 70–99)
Glucose-Capillary: 207 mg/dL — ABNORMAL HIGH (ref 70–99)

## 2020-01-13 LAB — COMPREHENSIVE METABOLIC PANEL
ALT: 41 U/L (ref 0–44)
AST: 38 U/L (ref 15–41)
Albumin: 2.4 g/dL — ABNORMAL LOW (ref 3.5–5.0)
Alkaline Phosphatase: 49 U/L (ref 38–126)
Anion gap: 9 (ref 5–15)
BUN: 41 mg/dL — ABNORMAL HIGH (ref 6–20)
CO2: 36 mmol/L — ABNORMAL HIGH (ref 22–32)
Calcium: 8.5 mg/dL — ABNORMAL LOW (ref 8.9–10.3)
Chloride: 93 mmol/L — ABNORMAL LOW (ref 98–111)
Creatinine, Ser: 0.51 mg/dL (ref 0.44–1.00)
GFR, Estimated: >60 mL/min (ref >60)
Glucose, Bld: 200 mg/dL — ABNORMAL HIGH (ref 70–99)
Potassium: 4.7 mmol/L (ref 3.5–5.1)
Sodium: 138 mmol/L (ref 135–145)
Total Bilirubin: 0.4 mg/dL (ref 0.3–1.2)
Total Protein: 6.3 g/dL — ABNORMAL LOW (ref 6.5–8.1)

## 2020-01-13 LAB — HEPARIN LEVEL (UNFRACTIONATED): Heparin Unfractionated: 0.63 IU/mL (ref 0.30–0.70)

## 2020-01-13 LAB — MAGNESIUM: Magnesium: 2.3 mg/dL (ref 1.7–2.4)

## 2020-01-13 LAB — PHOSPHORUS: Phosphorus: 4.1 mg/dL (ref 2.5–4.6)

## 2020-01-13 MED ORDER — VECURONIUM BOLUS VIA INFUSION
0.0800 mg/kg | Freq: Once | INTRAVENOUS | Status: AC
  Administered 2020-01-13: 9.4 mg via INTRAVENOUS
  Filled 2020-01-13: qty 10

## 2020-01-13 MED ORDER — VECURONIUM BROMIDE 10 MG IV SOLR
0.0000 ug/kg/min | Status: DC
  Administered 2020-01-13: 1 ug/kg/min via INTRAVENOUS
  Administered 2020-01-14: 1.239 ug/kg/min via INTRAVENOUS
  Administered 2020-01-14: 1.14 ug/kg/min via INTRAVENOUS
  Administered 2020-01-15: 1.24 ug/kg/min via INTRAVENOUS
  Filled 2020-01-13 (×4): qty 100

## 2020-01-13 MED ORDER — FUROSEMIDE 10 MG/ML IJ SOLN
40.0000 mg | Freq: Once | INTRAMUSCULAR | Status: AC
  Administered 2020-01-13: 40 mg via INTRAVENOUS
  Filled 2020-01-13: qty 4

## 2020-01-13 MED ORDER — VECURONIUM BROMIDE 10 MG IV SOLR
INTRAVENOUS | Status: AC
  Administered 2020-01-13: 10 mg
  Filled 2020-01-13: qty 10

## 2020-01-13 MED ORDER — VECURONIUM BROMIDE 10 MG IV SOLR
INTRAVENOUS | Status: AC
  Filled 2020-01-13: qty 10

## 2020-01-13 MED ORDER — FUROSEMIDE 10 MG/ML IJ SOLN
40.0000 mg | INTRAMUSCULAR | Status: AC
  Administered 2020-01-13: 40 mg via INTRAVENOUS
  Filled 2020-01-13: qty 4

## 2020-01-13 MED ORDER — VECURONIUM BROMIDE 10 MG IV SOLR
10.0000 mg | INTRAVENOUS | Status: DC | PRN
  Administered 2020-01-13: 10 mg via INTRAVENOUS

## 2020-01-13 NOTE — Progress Notes (Signed)
Peripherally Inserted Central Catheter Placement  The IV Nurse has discussed with the patient and/or persons authorized to consent for the patient, the purpose of this procedure and the potential benefits and risks involved with this procedure.  The benefits include less needle sticks, lab draws from the catheter, and the patient may be discharged home with the catheter. Risks include, but not limited to, infection, bleeding, blood clot (thrombus formation), and puncture of an artery; nerve damage and irregular heartbeat and possibility to perform a PICC exchange if needed/ordered by physician.  Alternatives to this procedure were also discussed.  Bard Power PICC patient education guide, fact sheet on infection prevention and patient information card has been provided to patient /or left at bedside.    PICC Placement Documentation  PICC Triple Lumen 01/13/20 PICC Right Basilic 42 cm 0 cm (Active)  Indication for Insertion or Continuance of Line Vasoactive infusions 01/13/20 2235  Exposed Catheter (cm) 0 cm 01/13/20 2235  Site Assessment Clean;Dry;Intact 01/13/20 2235  Lumen #1 Status Blood return noted;Flushed;Saline locked 01/13/20 2235  Lumen #2 Status Blood return noted;Flushed;Saline locked 01/13/20 2235  Lumen #3 Status Blood return noted;Flushed;Saline locked 01/13/20 2235  Dressing Type Transparent;Occlusive;Securing device 01/13/20 2235  Dressing Status Clean;Dry;Intact 01/13/20 2235  Antimicrobial disc in place? Yes 01/13/20 2235  Safety Lock Not Applicable 01/13/20 2235  Line Adjustment (NICU/IV Team Only) No 01/13/20 2235  Dressing Intervention New dressing 01/13/20 2235  Dressing Change Due 01/20/20 01/13/20 2235       Christeen Douglas 01/13/2020, 11:11 PM

## 2020-01-13 NOTE — Progress Notes (Signed)
CRITICAL CARE NOTE 54 year old remote former smoker, with no chronic significant medical issues other than obesity and hypertension, who presents for evaluation of shortness of breath over the last week. The patient states that approximately 6 weeks ago she developed a dry hacking cough which is not unusual for her during "allergy season and change of season". However, approximately a week ago she noted fever and congestion and feeling significantly short of breath. Despite this she went to have her annual medical exam done and had a flu vaccine given at that time. She not vaccinated against COVID-19. She has had some chest discomfort when coughing but not at any other time. No orthopnea or paroxysmal nocturnal dyspnea until last night. Noted that she could not lay flat to sleep. He has not had any abdominal pain, no nausea, vomiting or bowel habit disruption. She has had mild to moderate headache for the last week. Cough became almost constant a day ago which prompted her to come to the emergency room today. Aside from this she voices no other complaint.  She was evaluated at the emergency room where she was noted to be tachypneic and have a temperature of 101.6 F on arrival. She also was noted to have oxygen saturations of 56% on room air. She was placed on heated high flow O2 and nonrebreather mask and is now saturating 100%. PCCM has been asked to admit the patient to stepdown/ICU.  MICRO Covid PCR 22-Jan-2020 = Positiver [flu PCR negative] Blood culture 01/22/20-negative Urine culture 12/27/2019-less than 10,000 colonies insignificant growth Blood culture 01/02/2020-negative Respiratory culture tracheal aspirate 01/03/2020 -rare MSSA MRSA PCR 01/04/2020-negative  ANTIBIOTICS Remdesivir 22-Jan-2020-22-Jan-2020 Cefepime 12/27/2019-12/27/2019 Cefepime 01/02/2020-01/06/2020 Cefazolin 01/06/2020- x 7 days  10/24 severe hypoxia. COVID Positivie 10/25 intubation and MV support and severe  hypoxia, ETT tube exchanged 10/26-10/31 severe hypoxia and severe ARDS 10/30 - 2h of nimbex and  10/31 early hours - 24h of vec 11/1 high risk for cardiac arrest and death. MSSA on Trch aspiiate 2023-02-01 severe ARDS, s/p proning.  R calf DVT  -started on heparin gtt 11/4 severe hypoxia-did NOT tolerate proning. ECHO 0 normal LV and RV 11/5 - 75% fio32, high peep. Not on pressor.SUpine ventilation. Prn vec. Making urine. On dilaudid gtt and versed gtt. Per Pharmacy got 2h of nimbex and then 24h of continuous on 10/30 - 10/31. +8L since admit. Heparn gtt contiues  01/08/2020- desaturatiion overnight with worsening PF ratio. REdness in neck after niimbex. Now Prone since 0.30 today . On vec gtt with sedation gtt. Fio2 100% , peep 16 -> pulse ox 88%.. Not on perssors. Making urine. Diuresing well. Down t to +4L volume overload since admit. LITD CODe 11/8 remains very sick, severe ARDS, FULL CODE, husband updated 11/10- patient had not tolerated proning. She is making urine. CXR with pulm edema, will diurese and monitor PICC CVP trend only.  11/11- continue current care, patient unchanged over 24h with severe ARDS  CC  follow up respiratory failure  SUBJECTIVE Patient remains critically ill Prognosis is guarded Severe ARDS  Vent Mode: PRVC FiO2 (%):  [100 %] 100 % Set Rate:  [27 bmp-30 bmp] 30 bmp Vt Set:  [400 mL-480 mL] 400 mL PEEP:  [16 cmH20] 16 cmH20 Plateau Pressure:  [22 cmH20-24 cmH20] 24 cmH20 CBC    Component Value Date/Time   WBC 22.5 (H) 01/13/2020 0534   RBC 3.56 (L) 01/13/2020 0534   HGB 10.5 (L) 01/13/2020 0534   HCT 33.8 (L) 01/13/2020 0534   PLT 129 (  L) 01/13/2020 0534   MCV 94.9 01/13/2020 0534   MCH 29.5 01/13/2020 0534   MCHC 31.1 01/13/2020 0534   RDW 13.9 01/13/2020 0534   LYMPHSABS 1.2 01/13/2020 0534   MONOABS 1.2 (H) 01/13/2020 0534   EOSABS 0.0 01/13/2020 0534   BASOSABS 0.1 01/13/2020 0534   BMP Latest Ref Rng & Units 01/13/2020 01/12/2020 01/11/2020   Glucose 70 - 99 mg/dL 161(W) 960(A) 540(J)  BUN 6 - 20 mg/dL 81(X) 91(Y) 78(G)  Creatinine 0.44 - 1.00 mg/dL 9.56 2.13(Y) 8.65  BUN/Creat Ratio 9 - 23 - - -  Sodium 135 - 145 mmol/L 138 136 138  Potassium 3.5 - 5.1 mmol/L 4.7 4.3 4.5  Chloride 98 - 111 mmol/L 93(L) 91(L) 88(L)  CO2 22 - 32 mmol/L 36(H) 35(H) 38(H)  Calcium 8.9 - 10.3 mg/dL 7.8(I) 6.9(G) 2.9(B)      BP 119/69   Pulse 94   Temp (!) 101.3 F (38.5 C)   Resp 15   Ht 5\' 6"  (1.676 m)   Wt 117 kg   LMP 08/16/2014   SpO2 93%   BMI 41.63 kg/m    I/O last 3 completed shifts: In: 6280.8 [I.V.:1474.4; NG/GT:4380; IV Piggyback:426.4] Out: 3450 [Urine:3450] No intake/output data recorded.  SpO2: 93 % O2 Flow Rate (L/min): 0 L/min (no flow data to report) FiO2 (%): 100 %  Estimated body mass index is 41.63 kg/m as calculated from the following:   Height as of this encounter: 5\' 6"  (1.676 m).   Weight as of this encounter: 117 kg.  SIGNIFICANT EVENTS   REVIEW OF SYSTEMS  PATIENT IS UNABLE TO PROVIDE COMPLETE REVIEW OF SYSTEMS DUE TO SEVERE CRITICAL ILLNESS    PHYSICAL EXAMINATION:  GENERAL:critically ill appearing, +resp distress HEENT - neck supple no JVD mouth with moist mucosa RESPIRATORY - ronchi b/l  CARDIOVASCULAR - NSR no MRG appreciated GI - +bs x4, obese abdomen Musculoskeletal - 1+ edema  NEUROLOGIC: obtunded, GCS<8   MEDICATIONS: I have reviewed all medications and confirmed regimen as documented   CULTURE RESULTS   Recent Results (from the past 240 hour(s))  MRSA PCR Screening     Status: None   Collection Time: 01/04/20  1:42 PM   Specimen: Nasal Mucosa; Nasopharyngeal  Result Value Ref Range Status   MRSA by PCR NEGATIVE NEGATIVE Final    Comment:        The GeneXpert MRSA Assay (FDA approved for NASAL specimens only), is one component of a comprehensive MRSA colonization surveillance program. It is not intended to diagnose MRSA infection nor to guide or monitor treatment  for MRSA infections. Performed at [facility], [address] [place], [address] [place]           IMAGING    No results found.   Nutrition Status: Nutrition Problem: Inadequate oral intake Etiology: inability to eat Signs/Symptoms: NPO status Interventions: Tube feeding, Prostat, MVI     Indwelling Urinary Catheter continued, requirement due to   Reason to continue Indwelling Urinary Catheter strict Intake/Output monitoring for hemodynamic instability   Central Line/ continued, requirement due to  Reason to continue [place] Monitoring of central venous pressure or other hemodynamic parameters and poor IV access   Ventilator continued, requirement due to severe respiratory failure   Ventilator Sedation RASS 0 to -2      ASSESSMENT AND PLAN SYNOPSIS   Acute hypoxemic respiratory failure due to COVID-19 pneumonia / ARDS Mechanical ventilation via ARDS protocol, target PRVC 6 cc/kg Wean PEEP  and FiO2 as able Goal plateau pressure less than 30, driving pressure less than 15 Paralytics if necessary for vent synchrony, gas exchange Cycle prone positioning if necessary for oxygenation Deep sedation per PAD protocol, goal RASS -4, currently fentanyl, midazolam Diuresis as blood pressure and renal function can tolerate, goal CVP 5-8.   diuresis as tolerated based on Kidney function VAP prevention order set Remdesivir  11/10-11/21- PICC CVP trending, goal diuresis with 1L/24h.  When FIo2 <85% re-attempt proning.  DC diamox and instead increase minute ventilation from 10.5>>15 to treat hyercapnia and resultant alkalemia    ACUTE DIASTOLIC CARDIAC FAILURE-  -Lasix as tolerated   +DVT Continue heparin drip  Morbid obesity, possible OSA.   Will certainly impact respiratory mechanics, ventilator weaning Suspect will need to consider additional PEEP   NEUROLOGY Acute toxic metabolic encephalopathy, need for sedation Goal RASS -2 to  -3  CARDIAC ICU monitoring  ID -continue IV abx as prescibed -follow up cultures  GI GI PROPHYLAXIS as indicated   DIET-->TF's as tolerated Constipation protocol as indicated  ENDO - will use ICU hypoglycemic\Hyperglycemia protocol if indicated     ELECTROLYTES -follow labs as needed -replace as needed -pharmacy consultation and following   DVT/GI PRX ordered and assessed TRANSFUSIONS AS NEEDED MONITOR FSBS I Assessed the need for Labs I Assessed the need for Foley I Assessed the need for Central Venous Line Family Discussion when available I Assessed the need for Mobilization I made an Assessment of medications to be adjusted accordingly Safety Risk assessment completed   CASE DISCUSSED IN MULTIDISCIPLINARY ROUNDS WITH ICU TEAM  Critical Care Time devoted to patient care services described in this note is 33 minutes.   Overall, patient is critically ill, prognosis is guarded.  Patient with Multiorgan failure and at high risk for cardiac arrest and death.     Vida Rigger, M.D.  Pulmonary & Critical Care Medicine  [facility]

## 2020-01-13 NOTE — Progress Notes (Signed)
ANTICOAGULATION CONSULT NOTE  Pharmacy Consult for Heparin  Indication: pulmonary embolus  Patient Measurements: Height: 5\' 6"  (167.6 cm) Weight: 117 kg (257 lb 15 oz) IBW/kg (Calculated) : 59.3 Heparin Dosing Weight: 89.15 kg   Labs: Recent Labs    01/11/20 0508 01/11/20 0508 01/12/20 0634 01/13/20 0534  HGB 11.3*   < > 10.5* 10.5*  HCT 36.4  --  33.4* 33.8*  PLT 167  --  133* 129*  HEPARINUNFRC 0.45  --  0.39 0.63  CREATININE 0.57  --  0.42* 0.51   < > = values in this interval not displayed.    Estimated Creatinine Clearance: 102.1 mL/min (by C-G formula based on SCr of 0.51 mg/dL).   Medical History: Past Medical History:  Diagnosis Date  . Hepatitis   . Hypertension   . Sciatica    Assessment: Patient is a 54 y/o F with medical history as above who is admitted with severe COVID-19 pneumonia with ARDS requiring intubation and mechanical ventilation. Now with concern for pulmonary embolus. Pharmacy has been consulted to initiate heparin infusion for high clinical suspicion for pulmonary embolus.    Goal of Therapy:  Heparin level 0.3-0.7 units/ml Monitor platelets by anticoagulation protocol: Yes   Plan:  Heparin level 11/11 @ 0534 = 0.63, remains therapeutic for several days. Will continue heparin drip at 1750 units/hr and check HL and CBC daily with morning labs.  57, PharmD 01/13/2020,6:26 AM

## 2020-01-13 NOTE — Progress Notes (Signed)
Picc line migrated approx 4cm out during dressing change. CXR done to verify placement after, tip is over distal brachiocephalic. Results told to [facility]. Ok to use PICC given current infusions. Plan to discuss tomorrow with PICC team.

## 2020-01-13 NOTE — TOC Progression Note (Addendum)
Transition of Care [facility]) - Progression Note    Patient Details  Name: Carrie Davies MRN: [id number] Date of Birth: June 28, 1965  [facility] CM/SW Contact  Marina Goodell Phone Number:  [phone] 01/13/2020, 3:49 PM  Clinical Narrative:      CSW received call from Fertile at [facility] [phone].  Kirt Boys wanted to update CSW on the different services that were available to [name] and she stated she was having trouble reaching [name] to let him know.  CSW stated she would reach out to [facility] and let him know and give him the contact information.    Patient is still FULL CODE, as per Mr. [facility] request.   Expected Discharge Plan: Home/Self Care Barriers to Discharge: Continued Medical Work up  Expected Discharge Plan and Services Expected Discharge Plan: Home/Self Care In-house Referral: Clinical Social Work     Living arrangements for the past 2 months: Mobile Home                                       Social Determinants of Health (SDOH) Interventions    Readmission Risk Interventions No flowsheet data found.

## 2020-01-14 DIAGNOSIS — U071 COVID-19: Secondary | ICD-10-CM | POA: Diagnosis not present

## 2020-01-14 DIAGNOSIS — Z7189 Other specified counseling: Secondary | ICD-10-CM | POA: Diagnosis not present

## 2020-01-14 DIAGNOSIS — J9600 Acute respiratory failure, unspecified whether with hypoxia or hypercapnia: Secondary | ICD-10-CM | POA: Diagnosis not present

## 2020-01-14 DIAGNOSIS — Z515 Encounter for palliative care: Secondary | ICD-10-CM | POA: Diagnosis not present

## 2020-01-14 LAB — COMPREHENSIVE METABOLIC PANEL
ALT: 52 U/L — ABNORMAL HIGH (ref 0–44)
AST: 43 U/L — ABNORMAL HIGH (ref 15–41)
Albumin: 2.8 g/dL — ABNORMAL LOW (ref 3.5–5.0)
Alkaline Phosphatase: 51 U/L (ref 38–126)
Anion gap: 12 (ref 5–15)
BUN: 42 mg/dL — ABNORMAL HIGH (ref 6–20)
CO2: 40 mmol/L — ABNORMAL HIGH (ref 22–32)
Calcium: 8.9 mg/dL (ref 8.9–10.3)
Chloride: 89 mmol/L — ABNORMAL LOW (ref 98–111)
Creatinine, Ser: 0.53 mg/dL (ref 0.44–1.00)
GFR, Estimated: >60 mL/min (ref >60)
Glucose, Bld: 166 mg/dL — ABNORMAL HIGH (ref 70–99)
Potassium: 4.5 mmol/L (ref 3.5–5.1)
Sodium: 141 mmol/L (ref 135–145)
Total Bilirubin: 0.8 mg/dL (ref 0.3–1.2)
Total Protein: 6.9 g/dL (ref 6.5–8.1)

## 2020-01-14 LAB — CBC WITH DIFFERENTIAL/PLATELET
Abs Immature Granulocytes: 1.19 10*3/uL — ABNORMAL HIGH (ref 0.00–0.07)
Basophils Absolute: 0.1 10*3/uL (ref 0.0–0.1)
Basophils Relative: 0 %
Eosinophils Absolute: 0 10*3/uL (ref 0.0–0.5)
Eosinophils Relative: 0 %
HCT: 37.8 % (ref 36.0–46.0)
Hemoglobin: 11.7 g/dL — ABNORMAL LOW (ref 12.0–15.0)
Immature Granulocytes: 4 %
Lymphocytes Relative: 4 %
Lymphs Abs: 1.2 10*3/uL (ref 0.7–4.0)
MCH: 29.5 pg (ref 26.0–34.0)
MCHC: 31 g/dL (ref 30.0–36.0)
MCV: 95.2 fL (ref 80.0–100.0)
Monocytes Absolute: 1.7 10*3/uL — ABNORMAL HIGH (ref 0.1–1.0)
Monocytes Relative: 5 %
Neutro Abs: 28.6 10*3/uL — ABNORMAL HIGH (ref 1.7–7.7)
Neutrophils Relative %: 87 %
Platelets: 80 10*3/uL — ABNORMAL LOW (ref 150–400)
RBC: 3.97 MIL/uL (ref 3.87–5.11)
RDW: 14.6 % (ref 11.5–15.5)
Smear Review: NORMAL
WBC: 32.8 10*3/uL — ABNORMAL HIGH (ref 4.0–10.5)
nRBC: 0.5 % — ABNORMAL HIGH (ref 0.0–0.2)

## 2020-01-14 LAB — HEPARIN LEVEL (UNFRACTIONATED): Heparin Unfractionated: 0.49 IU/mL (ref 0.30–0.70)

## 2020-01-14 LAB — BLOOD GAS, ARTERIAL
Acid-Base Excess: 14.5 mmol/L — ABNORMAL HIGH (ref 0.0–2.0)
Bicarbonate: 41.8 mmol/L — ABNORMAL HIGH (ref 20.0–28.0)
FIO2: 1
MECHVT: 400 mL
O2 Saturation: 90 %
PEEP: 16 cmH2O
Patient temperature: 37
RATE: 30 resp/min
pCO2 arterial: 66 mmHg (ref 32.0–48.0)
pH, Arterial: 7.41 (ref 7.350–7.450)
pO2, Arterial: 58 mmHg — ABNORMAL LOW (ref 83.0–108.0)

## 2020-01-14 LAB — GLUCOSE, CAPILLARY
Glucose-Capillary: 162 mg/dL — ABNORMAL HIGH (ref 70–99)
Glucose-Capillary: 163 mg/dL — ABNORMAL HIGH (ref 70–99)
Glucose-Capillary: 165 mg/dL — ABNORMAL HIGH (ref 70–99)
Glucose-Capillary: 166 mg/dL — ABNORMAL HIGH (ref 70–99)
Glucose-Capillary: 169 mg/dL — ABNORMAL HIGH (ref 70–99)
Glucose-Capillary: 190 mg/dL — ABNORMAL HIGH (ref 70–99)
Glucose-Capillary: 217 mg/dL — ABNORMAL HIGH (ref 70–99)

## 2020-01-14 LAB — PHOSPHORUS: Phosphorus: 4.7 mg/dL — ABNORMAL HIGH (ref 2.5–4.6)

## 2020-01-14 LAB — MAGNESIUM: Magnesium: 2.5 mg/dL — ABNORMAL HIGH (ref 1.7–2.4)

## 2020-01-14 MED ORDER — METHYLPREDNISOLONE SODIUM SUCC 40 MG IJ SOLR
40.0000 mg | Freq: Once | INTRAMUSCULAR | Status: AC
  Administered 2020-01-14: 40 mg via INTRAVENOUS
  Filled 2020-01-14: qty 1

## 2020-01-14 MED ORDER — HYDROMORPHONE HCL 2 MG PO TABS
2.0000 mg | ORAL_TABLET | Freq: Four times a day (QID) | ORAL | Status: DC
  Administered 2020-01-14 – 2020-01-17 (×12): 2 mg
  Filled 2020-01-14 (×12): qty 1

## 2020-01-14 MED ORDER — FUROSEMIDE 10 MG/ML PO SOLN
40.0000 mg | Freq: Every day | ORAL | Status: DC
  Administered 2020-01-14: 40 mg
  Filled 2020-01-14: qty 5
  Filled 2020-01-14: qty 4
  Filled 2020-01-14 (×2): qty 5

## 2020-01-14 MED ORDER — CHLORDIAZEPOXIDE HCL 25 MG PO CAPS
25.0000 mg | ORAL_CAPSULE | Freq: Four times a day (QID) | ORAL | Status: DC
  Administered 2020-01-14 – 2020-01-16 (×12): 25 mg
  Filled 2020-01-14 (×12): qty 1

## 2020-01-14 NOTE — Progress Notes (Signed)
Patient hypoxic in the 80's at shift change, on PRVC: 100%, peep: 16, TV: 480 with peak pressures in the 50's.  - Patient received vecuronium IVP at 20:00, Sobi, RT called bedside: assessed for cuff leak. No leak present. Patient remains hypoxic 77% - 84% - Second vecuronium IVP given at 21:00, vecuronium drip ordered. - PICC team consulted to assess PICC line since it had been retracted 4 cm. - Increased PEEP to 18, turned TV to 400.  - PICC exchanged, STAT CXR to r/o PTX.  CXR negative for PTX, stable opacities. PICC in optimal place, paralytic infusing with sedation.  Patient remains hypoxic dropping SpO2 to 68% - 72%. Patient prone at 00:15, SpO2 sustaining > 80%. Will continue to monitor.  Updated patient's husband regarding events of the evening.  Cheryll Cockayne Rust-Chester, AGACNP-BC Acute Care Nurse Practitioner [facility]   [phone] / [phone] Please see Amion for pager details.

## 2020-01-14 NOTE — Progress Notes (Signed)
ANTICOAGULATION CONSULT NOTE  Pharmacy Consult for Heparin  Indication: pulmonary embolus  Patient Measurements: Height: 5\' 6"  (167.6 cm) Weight: 117 kg (257 lb 15 oz) IBW/kg (Calculated) : 59.3 Heparin Dosing Weight: 89.15 kg   Labs: Recent Labs    01/12/20 0634 01/13/20 0534 01/14/20 0344  HGB 10.5* 10.5*  --   HCT 33.4* 33.8*  --   PLT 133* 129*  --   HEPARINUNFRC 0.39 0.63 0.49  CREATININE 0.42* 0.51 0.53    Estimated Creatinine Clearance: 102.1 mL/min (by C-G formula based on SCr of 0.53 mg/dL).   Medical History: Past Medical History:  Diagnosis Date  . Hepatitis   . Hypertension   . Sciatica    Assessment: Patient is a 54 y/o F with medical history as above who is admitted with severe COVID-19 pneumonia with ARDS requiring intubation and mechanical ventilation. Now with concern for pulmonary embolus. Pharmacy has been consulted to initiate heparin infusion for high clinical suspicion for pulmonary embolus.    Goal of Therapy:  Heparin level 0.3-0.7 units/ml Monitor platelets by anticoagulation protocol: Yes   Plan:  Heparin level 11/12 @ 0344 = 0.49, remains therapeutic for several days. Will continue heparin drip at 1750 units/hr and check HL and CBC daily with morning labs.  13/12, PharmD 01/14/2020,5:38 AM

## 2020-01-14 NOTE — Progress Notes (Addendum)
Neuro: tolerating sedation and paralytic well, beginning to transition to PO sedation-starting on PO dilaudid and librium Resp: tolerating vent settings well, pt's sats ranged from 68-86% throughout the day, she tends to sat higher when in the reverse trendelenburg position CV: afebrile, warm and well perfused GIGU: foley in place, fecal management system in place, OG in place-restarted feeds/FWF, tolerating well  Skin: sacral mepilex in place, some errythema to patient's buttock Social: Husband came to visit and had meeting with NP Arlyss Repress and Dr. Karna Christmas for updates and planning of care.  Zorita Pang also came to provide pt with her last rights, Syliva Overman was at the bedside to provide additional support.  Husband was appropriate and supportive, he is very hopeful for a positive outcome for the patient.

## 2020-01-14 NOTE — Progress Notes (Signed)
CRITICAL CARE NOTE 54 year old remote former smoker, with no chronic significant medical issues other than obesity and hypertension, who presents for evaluation of shortness of breath over the last week. The patient states that approximately 6 weeks ago she developed a dry hacking cough which is not unusual for her during "allergy season and change of season". However, approximately a week ago she noted fever and congestion and feeling significantly short of breath. Despite this she went to have her annual medical exam done and had a flu vaccine given at that time. She not vaccinated against COVID-19. She has had some chest discomfort when coughing but not at any other time. No orthopnea or paroxysmal nocturnal dyspnea until last night. Noted that she could not lay flat to sleep. He has not had any abdominal pain, no nausea, vomiting or bowel habit disruption. She has had mild to moderate headache for the last week. Cough became almost constant a day ago which prompted her to come to the emergency room today. Aside from this she voices no other complaint.  She was evaluated at the emergency room where she was noted to be tachypneic and have a temperature of 101.6 F on arrival. She also was noted to have oxygen saturations of 56% on room air. She was placed on heated high flow O2 and nonrebreather mask and is now saturating 100%. PCCM has been asked to admit the patient to stepdown/ICU.  MICRO Covid PCR 12/08/2019 = Positiver [flu PCR negative] Blood culture 12/19/2019-negative Urine culture 12/27/2019-less than 10,000 colonies insignificant growth Blood culture 01/02/2020-negative Respiratory culture tracheal aspirate 01/03/2020 -rare MSSA MRSA PCR 01/04/2020-negative  ANTIBIOTICS Remdesivir 12/22/2019-12/16/2019 Cefepime 12/27/2019-12/27/2019 Cefepime 01/02/2020-01/06/2020 Cefazolin 01/06/2020- x 7 days  10/24 severe hypoxia. COVID Positivie 10/25 intubation and MV support and severe  hypoxia, ETT tube exchanged 10/26-10/31 severe hypoxia and severe ARDS 10/30 - 2h of nimbex and  10/31 early hours - 24h of vec 11/1 high risk for cardiac arrest and death. MSSA on Trch aspiiate 2023-01-06 severe ARDS, s/p proning.  R calf DVT  -started on heparin gtt 11/4 severe hypoxia-did NOT tolerate proning. ECHO 0 normal LV and RV 11/5 - 75% fio32, high peep. Not on pressor.SUpine ventilation. Prn vec. Making urine. On dilaudid gtt and versed gtt. Per Pharmacy got 2h of nimbex and then 24h of continuous on 10/30 - 10/31. +8L since admit. Heparn gtt contiues  01/08/2020- desaturatiion overnight with worsening PF ratio. REdness in neck after niimbex. Now Prone since 0.30 today . On vec gtt with sedation gtt. Fio2 100% , peep 16 -> pulse ox 88%.. Not on perssors. Making urine. Diuresing well. Down t to +4L volume overload since admit. LITD CODe 11/8 remains very sick, severe ARDS, FULL CODE, husband updated 11/10- patient had not tolerated proning. She is making urine. CXR with pulm edema, will diurese and monitor PICC CVP trend only.  11/11-11/12 continue current care, patient unchanged over 24h with severe ARDS.  Poor prognosis maximal support on ventilatory with hypoxemia <70%.  Husband was updated and asked to come in person as patient high risk of cardiac/respiratory arrest.  CC  follow up respiratory failure  SUBJECTIVE Patient remains critically ill Prognosis is guarded Severe ARDS  Vent Mode: PRVC FiO2 (%):  [100 %] 100 % Set Rate:  [30 bmp] 30 bmp Vt Set:  [400 mL-480 mL] 400 mL PEEP:  [16 cmH20-18 cmH20] 18 cmH20 Plateau Pressure:  [45 cmH20-54 cmH20] 48 cmH20 CBC    Component Value Date/Time   WBC 32.8 (H)  01/14/2020 0344   RBC 3.97 01/14/2020 0344   HGB 11.7 (L) 01/14/2020 0344   HCT 37.8 01/14/2020 0344   PLT 80 (L) 01/14/2020 0344   MCV 95.2 01/14/2020 0344   MCH 29.5 01/14/2020 0344   MCHC 31.0 01/14/2020 0344   RDW 14.6 01/14/2020 0344   LYMPHSABS 1.2 01/14/2020  0344   MONOABS 1.7 (H) 01/14/2020 0344   EOSABS 0.0 01/14/2020 0344   BASOSABS 0.1 01/14/2020 0344   BMP Latest Ref Rng & Units 01/14/2020 01/13/2020 01/12/2020  Glucose 70 - 99 mg/dL 937(J) 696(V) 893(Y)  BUN 6 - 20 mg/dL 10(F) 75(Z) 02(H)  Creatinine 0.44 - 1.00 mg/dL 8.52 7.78 2.42(P)  BUN/Creat Ratio 9 - 23 - - -  Sodium 135 - 145 mmol/L 141 138 136  Potassium 3.5 - 5.1 mmol/L 4.5 4.7 4.3  Chloride 98 - 111 mmol/L 89(L) 93(L) 91(L)  CO2 22 - 32 mmol/L 40(H) 36(H) 35(H)  Calcium 8.9 - 10.3 mg/dL 8.9 5.3(I) 1.4(E)      BP 107/70   Pulse 83   Temp 97.6 F (36.4 C) (Oral)   Resp (!) 30   Ht 5\' 6"  (1.676 m)   Wt 117 kg   LMP 08/16/2014   SpO2 (!) 82%   BMI 41.63 kg/m    I/O last 3 completed shifts: In: 2967.5 [I.V.:1340.5; NG/GT:1400; IV Piggyback:227] Out: 4050 [Urine:3900; Stool:150] Total I/O In: 368.9 [I.V.:265.1; NG/GT:103.8] Out: 500 [Urine:460; Stool:40]  SpO2: (!) 82 % O2 Flow Rate (L/min): 0 L/min (no flow data to report) FiO2 (%): 100 %  Estimated body mass index is 41.63 kg/m as calculated from the following:   Height as of this encounter: 5\' 6"  (1.676 m).   Weight as of this encounter: 117 kg.  SIGNIFICANT EVENTS   REVIEW OF SYSTEMS  PATIENT IS UNABLE TO PROVIDE COMPLETE REVIEW OF SYSTEMS DUE TO SEVERE CRITICAL ILLNESS    PHYSICAL EXAMINATION:  GENERAL:critically ill appearing, +resp distress HEENT - neck supple no JVD mouth with moist mucosa RESPIRATORY - ronchi b/l  CARDIOVASCULAR - NSR no MRG appreciated GI - +bs x4, obese abdomen Musculoskeletal - 1+ edema  NEUROLOGIC: obtunded, GCS<8   MEDICATIONS: I have reviewed all medications and confirmed regimen as documented   CULTURE RESULTS   No results found for this or any previous visit (from the past 240 hour(s)).        IMAGING    DG Chest Port 1 View  Result Date: 01/13/2020 CLINICAL DATA:  Hypoxia EXAM: PORTABLE CHEST 1 VIEW COMPARISON:  01/13/2020, 01/12/2020,  01/10/2020 FINDINGS: Endotracheal tube tip is about 2.8 cm superior to carina. Esophageal tube tip below the diaphragm but incompletely visualized. Right upper extremity central venous catheter tip over the SVC. Widespread ground-glass opacities and patchy consolidations without significant change. Stable cardiomediastinal silhouette. No pneumothorax. IMPRESSION: 1. Right upper extremity central venous catheter tip over the SVC. 2. Widespread airspace disease without significant change since radiograph performed earlier today. Electronically Signed   By: 13/12/2019 M.D.   On: 01/13/2020 23:14   DG Chest Port 1 View  Result Date: 01/13/2020 CLINICAL DATA:  COVID EXAM: PORTABLE CHEST 1 VIEW COMPARISON:  01/12/2020, 01/10/2020, 01/09/2020 FINDINGS: Endotracheal tube tip is about 2 cm superior to the carina. Esophageal tube tip is below the diaphragm but incompletely visualized. Right upper extremity central venous catheter tip appears slightly retracted, the tip overlies the distal brachiocephalic region. Continued bilateral ground-glass opacities and patchy consolidations, without significant change. Stable cardiomediastinal silhouette. No pneumothorax. IMPRESSION:  1. No significant interval change in bilateral ground-glass opacities and patchy consolidations. 2. Right upper extremity central venous catheter tip appears slightly retracted, the tip now overlies the distal brachiocephalic region. Electronically Signed   By: Jasmine Pang M.D.   On: 01/13/2020 17:20   [place] EKG SITE RITE  Result Date: 01/13/2020 If Site Rite image not attached, placement could not be confirmed due to current cardiac rhythm.    Nutrition Status: Nutrition Problem: Inadequate oral intake Etiology: inability to eat Signs/Symptoms: NPO status Interventions: Tube feeding, Prostat, MVI     Indwelling Urinary Catheter continued, requirement due to   Reason to continue Indwelling Urinary Catheter strict Intake/Output  monitoring for hemodynamic instability   Central Line/ continued, requirement due to  Reason to continue [employer] Monitoring of central venous pressure or other hemodynamic parameters and poor IV access   Ventilator continued, requirement due to severe respiratory failure   Ventilator Sedation RASS 0 to -2      ASSESSMENT AND PLAN SYNOPSIS   Acute hypoxemic respiratory failure due to COVID-19 pneumonia / ARDS Mechanical ventilation via ARDS protocol, target PRVC 6 cc/kg Wean PEEP and FiO2 as able Goal plateau pressure less than 30, driving pressure less than 15 Paralytics if necessary for vent synchrony, gas exchange Cycle prone positioning if necessary for oxygenation Deep sedation per PAD protocol, goal RASS -4, currently fentanyl, midazolam Diuresis as blood pressure and renal function can tolerate, goal CVP 5-8.   diuresis as tolerated based on Kidney function VAP prevention order set Remdesivir  11/10-11/11- plan to minimize IV infusions, change sedation to PO with goal rass negative 4 while paralyzed. Continue diuresis.    ACUTE DIASTOLIC CARDIAC FAILURE-  -Lasix as tolerated   +DVT Continue heparin drip  Morbid obesity, possible OSA.   Will certainly impact respiratory mechanics, ventilator weaning Suspect will need to consider additional PEEP   NEUROLOGY Acute toxic metabolic encephalopathy, need for sedation Goal RASS -2 to -3  CARDIAC ICU monitoring  ID -continue IV abx as prescibed -follow up cultures  GI GI PROPHYLAXIS as indicated   DIET-->TF's as tolerated Constipation protocol as indicated  ENDO - will use ICU hypoglycemic\Hyperglycemia protocol if indicated     ELECTROLYTES -follow labs as needed -replace as needed -pharmacy consultation and following   DVT/GI PRX ordered and assessed TRANSFUSIONS AS NEEDED MONITOR FSBS I Assessed the need for Labs I Assessed the need for Foley I Assessed the need for Central Venous  Line Family Discussion when available I Assessed the need for Mobilization I made an Assessment of medications to be adjusted accordingly Safety Risk assessment completed   CASE DISCUSSED IN MULTIDISCIPLINARY ROUNDS WITH ICU TEAM  Critical Care Time devoted to patient care services described in this note is 33 minutes.   Overall, patient is critically ill, prognosis is guarded.  Patient with Multiorgan failure and at high risk for cardiac arrest and death.     Vida Rigger, M.D.  Pulmonary & Critical Care Medicine  [facility]

## 2020-01-14 NOTE — Progress Notes (Addendum)
Notified Dr. Karna Christmas via telephone pt continues to decompensate with O2 sats decreasing to 69-80% despite multiple interventions throughout the shift which include: decreasing TV from 480 to 400 due to elevated peak pressures >50;  increasing PEEP to 18; attempted to change vent settings from [facility] to PC, however pt developed worsening hypoxia requiring immediate need to change vent settings back to [facility] mode; recruitment maneuvers performed; stat CXR obtained to r/o pneumothorax which was negative; vecuronium bolus ordered followed by vecuronium gtt; and pt proned.  Pts husband contacted via telephone regarding significant decline in pts condition and wants pt to remain a FULL CODE.  Following proning pt O2 sats initially increased to 80-85%, however she later developed severe hypoxia again with O2 sats decreasing to 74-77%. Therefore, pts position changed back to supine with O2 sats remaining @77 %.  Will administer 1x dose of 40 mg iv solumedrol and continue to monitor/assess pt. Pt at HIGH RISK for cardiac arrest followed by SUDDEN DEATH.   Critical Care Time devoted to patient care services described in this note is an additional 30 minutes.  , AGNP  Pulmonary/Critical Care Pager [phone] (please enter 7 digits) PCCM Consult Pager [phone] (please enter 7 digits)

## 2020-01-14 NOTE — Progress Notes (Signed)
[place] paged by palliative care NP to assist in coordinating [place] priest's visit to administer last rites to pt., who is in critical condition in ICU w/COVID.  [place] called [employer] and communicated need for priest's visit --> Fr. Vincent scheduled to come.  CH met pt.'s husband, CCM, and palliative care NP in ICU consult rm.; husband trying to process pt.'s critical condition; he is struggling to come to terms with pt.'s acute illness in comparison with his own non-symptomatic experience w/COVID; he shared that he continues to want to believe there is hope for pt. and is resistant to 'pulling the plug', but also is receptive to information being communicated by medical team about pt.'s prognosis.  Husband expressed frustration re: visitation policy that allows visitation in-rm. for COVID pt.s only in EOL situations; husband invited to visit pt. at bedside by CCM and charge RN.  When priest arrived, [facility] assisted in priest's donning PPE and accompanied him into rm., where he administered Anointing of the Sick w/husband at bedside.  [place] remains available as needed and will alert evening CH of situation.

## 2020-01-14 NOTE — Consult Note (Signed)
Consultation Note Date: 01/14/2020   Patient Name: Carrie Davies  DOB: Jan 04, 1966  MRN: [id number]  Age / Sex: 54 y.o., female  PCP: [place] Pink, MD Referring Physician: Flora Lipps, MD  Reason for Consultation: Establishing goals of care  HPI/Patient Profile: 54 y.o. female  with past medical history of HTN admitted on 12/06/2019 with shortness of breath and cough. Found to be hypoxic in ED.  COVID-19 positive. Required intubation 10/25. Also with bacterial pna. Has R calf DVT. Has not been able to tolerate prone position. PMT consulted for [place].   Clinical Assessment and Goals of Care: I have reviewed medical records including EPIC notes, labs and imaging, received report from Dr. Lanney Gins and RN, assessed the patient and then spoke with patient's spouse Percell Miller  to discuss diagnosis prognosis, [place], EOL wishes, disposition and options.  I introduced Palliative Medicine as specialized medical care for people living with serious illness. It focuses on providing relief from the symptoms and stress of a serious illness. The goal is to improve quality of life for both the patient and the family.  We discussed a brief life review of the patient. Percell Miller shares patient worked at [employer] in [place]. He tells me they have been married 37 years. They have an 90 year old son. He tells me Camyah is very religious.   As far as functional and nutritional status, Irelyn was doing very well prior to COVID infection. No health concerns - fully functional.    We discussed patient's current illness and what it means in the larger context of patient's on-going co-morbidities.  Natural disease trajectory and expectations at EOL were discussed. We discussed severity of her illness and dependence on ventilator. Discussed despite maximum support Alvita is not improving and we worry she is nearing end of life. He expresses understanding but Mr. Abdullah shares he remains  hopeful for a miracle.   Many concepts were discussed at Mr. Calmes prompting such as trach, ECMO, and medications used in COVID treatment. Mr. Kundert expresses that he understands everything that is an option for Maeva is being offered and she is being treated with very aggressive medical interventions.   We discussed code status. Encouraged Mr. Greenfield to consider DNR status understanding evidenced based poor outcomes in similar hospitalized patients, as the cause of the arrest is likely associated with terminal disease rather than a reversible acute cardio-pulmonary event.  He tells me he understands this but cannot agree to DNR status as he does not think he can live with this decision. He wants to know that if Donnae passes every attempt was made to save her life.   Mr. Lacko shares his desire for a Catholic priest to visit Verdis Frederickson - discussed with chaplain who is arranging.  Dr. Lanney Gins later joined our conversation and also provided information to Mr. Laubscher about Marlynn's condition.   Discussed with Mr. Klinck the importance of continued conversation with family and the medical providers regarding overall plan of care and treatment options, ensuring decisions are within the context of the patient's values and GOCs.    Emotional support provided to Mr. Louis throughout conversation.  Mr. Pickford was very concerned about being able to visit his wife inside her room since she may be near end of life - this was arranged by Dr. Lanney Gins and charge RN.   Questions and concerns were addressed. The family was encouraged to call with questions or concerns.   Primary Decision Maker NEXT OF KIN spouse Tymira Horkey  SUMMARY OF RECOMMENDATIONS   - long discussion about severity of illness, spouse understands, requests ongoing full code/full scope support   Code Status/Advance Care Planning:  Full code  Prognosis:   High risk for death at any time  Discharge Planning: To Be  Determined      Primary Diagnoses: Present on Admission: . Acute respiratory failure due to COVID-19 [facility])   I have reviewed the medical record, interviewed the patient and family, and examined the patient. The following aspects are pertinent.  Past Medical History:  Diagnosis Date  . Hepatitis   . Hypertension   . Sciatica    Social History   Socioeconomic History  . Marital status: Married    Spouse name: Not on file  . Number of children: Not on file  . Years of education: Not on file  . Highest education level: Not on file  Occupational History  . Not on file  Tobacco Use  . Smoking status: Former Research scientist (life sciences)  . Smokeless tobacco: Never Used  Vaping Use  . Vaping Use: Never used  Substance and Sexual Activity  . Alcohol use: No  . Drug use: No  . Sexual activity: Yes    Birth control/protection: None  Other Topics Concern  . Not on file  Social History Narrative  . Not on file   Social Determinants of Health   Financial Resource Strain:   . Difficulty of Paying Living Expenses: Not on file  Food Insecurity:   . Worried About Charity fundraiser in the Last Year: Not on file  . Ran Out of Food in the Last Year: Not on file  Transportation Needs:   . Lack of Transportation (Medical): Not on file  . Lack of Transportation (Non-Medical): Not on file  Physical Activity:   . Days of Exercise per Week: Not on file  . Minutes of Exercise per Session: Not on file  Stress:   . Feeling of Stress : Not on file  Social Connections:   . Frequency of Communication with Friends and Family: Not on file  . Frequency of Social Gatherings with Friends and Family: Not on file  . Attends Religious Services: Not on file  . Active Member of Clubs or Organizations: Not on file  . Attends Archivist Meetings: Not on file  . Marital Status: Not on file   Family History  Problem Relation Age of Onset  . Cancer - Other Mother   . Breast cancer Mother 56  . Breast  cancer Paternal Grandmother 81   Scheduled Meds: . artificial tears  1 application Both Eyes I2M  . chlordiazePOXIDE  25 mg Per Tube QID  . chlorhexidine gluconate (MEDLINE KIT)  15 mL Mouth Rinse BID  . Chlorhexidine Gluconate Cloth  6 each Topical Daily  . feeding supplement (PROSource TF)  45 mL Per Tube TID  . free water  30 mL Per Tube Q4H  .  HYDROmorphone (DILAUDID) injection  1 mg Intravenous Once  . HYDROmorphone  2 mg Per Tube Q6H  . insulin aspart  0-20 Units Subcutaneous Q4H  . mouth rinse  15 mL Mouth Rinse 10 times per day  . methylPREDNISolone (SOLU-MEDROL) injection  20 mg Intravenous BID  . multivitamin with minerals  1 tablet Per Tube Daily  . pantoprazole sodium  40 mg Per Tube BID   Continuous Infusions: . sodium chloride Stopped (01/12/20 2259)  . feeding supplement (VITAL 1.5 CAL) 70 mL/hr at 01/14/20 1600  . heparin 1,750  Units/hr (01/14/20 1600)  . HYDROmorphone 4 mg/hr (01/14/20 1600)  . midazolam 10 mg/hr (01/14/20 1600)  . vecuronium (NORCURON) infusion 169m/100mL (1 mg/mL) 1.24 mcg/kg/min (01/14/20 1600)   PRN Meds:.acetaminophen, docusate, HYDROmorphone, midazolam, midazolam, ondansetron (ZOFRAN) IV, polyethylene glycol, sodium chloride flush No Known Allergies Review of Systems  Unable to perform ROS: Intubated    Physical Exam Constitutional:      Comments: Remains on mechanical ventilation with sedation and paralytics     Vital Signs: BP 118/71 (BP Location: Left Arm)   Pulse 88   Temp 97.6 F (36.4 C) (Oral)   Resp (!) 30   Ht _0  (1.676 m)   Wt 117 kg   LMP 08/16/2014   SpO2 (!) 78%   BMI 41.63 kg/m  Pain Scale: CPOT   Pain Score: Asleep   SpO2: SpO2: (!) 78 % O2 Device:SpO2: (!) 78 % O2 Flow Rate: .O2 Flow Rate (L/min): 0 L/min (no flow data to report)  IO: Intake/output summary:   Intake/Output Summary (Last 24 hours) at 01/14/2020 1638 Last data filed at 01/14/2020 1600 Gross per 24 hour  Intake 1346.68 ml  Output  2260 ml  Net -913.32 ml    LBM: Last BM Date: 01/14/20 Baseline Weight: Weight: 123.8 kg Most recent weight: Weight: 117 kg     Palliative Assessment/Data: PPS 10%    Time Total: 110 minutes Greater than 50%  of this time was spent counseling and coordinating care related to the above assessment and plan.  SJuel Burrow DNP, AGNP-C Palliative Medicine Team [phone]: [phone]

## 2020-01-14 NOTE — TOC Progression Note (Signed)
Transition of Care [facility]) - Progression Note    Patient Details  Name: Carrie Davies MRN: [id number] Date of Birth: 1965/03/25  Transition [facility] CM/SW Contact  Marina Goodell Phone Number: [phone] 01/14/2020, 1:44 PM  Clinical Narrative:     CSW spoke with Cutbirth,Edward (Spouse) [phone].  Mr. Zaccaro had questions about the accuracy of the length of stay, stating the patient had been in the hospital 21 days.  CSW stated the chart showed the patient has been admitted for 19 days.  CSW explained to Mr. Wiesman the patient was admitted on the 25th and the days are counted in overnight stays.  Mr. Altmann was insistent it was 21 days since she came to the ED on the 24th. CSW explained even if we counted the 24th, the first full day would be on the 25th.  Mr. Pennings verbalized understanding but disagreed with CSW. Mr. Bagent stated he was on his way to meeting with Palliative Care RN and Attending, because "they told me she tanked last night.'  CSW stated I was aware of the patient's current status and knew Attending wanted to meet with Mr. Briones.  CSW relayed a message from [place] at [facility] [phone] for Mr. Clinkscale, stating [facility] wanted to offer services for Mr. Freestone and his son.  Mr. Curington stated his biggest concern is for his son, "who has mild autism and keeps on saying his mom is going to get better and come home."  Mr. Filsinger then stated he didn't want to give hope and that he was still hoping for a miracle.  CSW verbalized understanding.  Expected Discharge Plan: Home/Self Care Barriers to Discharge: Continued Medical Work up  Expected Discharge Plan and Services Expected Discharge Plan: Home/Self Care In-house Referral: Clinical Social Work     Living arrangements for the past 2 months: Mobile Home                                       Social Determinants of Health (SDOH) Interventions    Readmission Risk Interventions No flowsheet  data found.

## 2020-01-15 LAB — CBC WITH DIFFERENTIAL/PLATELET
Abs Immature Granulocytes: 0.97 10*3/uL — ABNORMAL HIGH (ref 0.00–0.07)
Basophils Absolute: 0.1 10*3/uL (ref 0.0–0.1)
Basophils Relative: 0 %
Eosinophils Absolute: 0 10*3/uL (ref 0.0–0.5)
Eosinophils Relative: 0 %
HCT: 34.1 % — ABNORMAL LOW (ref 36.0–46.0)
Hemoglobin: 10.6 g/dL — ABNORMAL LOW (ref 12.0–15.0)
Immature Granulocytes: 4 %
Lymphocytes Relative: 5 %
Lymphs Abs: 1.2 10*3/uL (ref 0.7–4.0)
MCH: 29.9 pg (ref 26.0–34.0)
MCHC: 31.1 g/dL (ref 30.0–36.0)
MCV: 96.1 fL (ref 80.0–100.0)
Monocytes Absolute: 1.3 10*3/uL — ABNORMAL HIGH (ref 0.1–1.0)
Monocytes Relative: 6 %
Neutro Abs: 20.2 10*3/uL — ABNORMAL HIGH (ref 1.7–7.7)
Neutrophils Relative %: 85 %
Platelets: 35 10*3/uL — ABNORMAL LOW (ref 150–400)
RBC: 3.55 MIL/uL — ABNORMAL LOW (ref 3.87–5.11)
RDW: 14.6 % (ref 11.5–15.5)
Smear Review: DECREASED
WBC: 23.7 10*3/uL — ABNORMAL HIGH (ref 4.0–10.5)
nRBC: 0.5 % — ABNORMAL HIGH (ref 0.0–0.2)

## 2020-01-15 LAB — COMPREHENSIVE METABOLIC PANEL
ALT: 41 U/L (ref 0–44)
AST: 30 U/L (ref 15–41)
Albumin: 2.5 g/dL — ABNORMAL LOW (ref 3.5–5.0)
Alkaline Phosphatase: 46 U/L (ref 38–126)
Anion gap: 12 (ref 5–15)
BUN: 47 mg/dL — ABNORMAL HIGH (ref 6–20)
CO2: 39 mmol/L — ABNORMAL HIGH (ref 22–32)
Calcium: 8.5 mg/dL — ABNORMAL LOW (ref 8.9–10.3)
Chloride: 90 mmol/L — ABNORMAL LOW (ref 98–111)
Creatinine, Ser: 0.4 mg/dL — ABNORMAL LOW (ref 0.44–1.00)
GFR, Estimated: >60 mL/min (ref >60)
Glucose, Bld: 220 mg/dL — ABNORMAL HIGH (ref 70–99)
Potassium: 4.6 mmol/L (ref 3.5–5.1)
Sodium: 141 mmol/L (ref 135–145)
Total Bilirubin: 0.7 mg/dL (ref 0.3–1.2)
Total Protein: 6.5 g/dL (ref 6.5–8.1)

## 2020-01-15 LAB — GLUCOSE, CAPILLARY
Glucose-Capillary: 214 mg/dL — ABNORMAL HIGH (ref 70–99)
Glucose-Capillary: 167 mg/dL — ABNORMAL HIGH (ref 70–99)
Glucose-Capillary: 185 mg/dL — ABNORMAL HIGH (ref 70–99)
Glucose-Capillary: 247 mg/dL — ABNORMAL HIGH (ref 70–99)
Glucose-Capillary: 153 mg/dL — ABNORMAL HIGH (ref 70–99)
Glucose-Capillary: 132 mg/dL — ABNORMAL HIGH (ref 70–99)

## 2020-01-15 LAB — APTT
aPTT: 34 seconds (ref 24–36)
aPTT: 35 seconds (ref 24–36)
aPTT: 33 seconds (ref 24–36)

## 2020-01-15 LAB — MAGNESIUM: Magnesium: 2.3 mg/dL (ref 1.7–2.4)

## 2020-01-15 LAB — PHOSPHORUS: Phosphorus: 3.7 mg/dL (ref 2.5–4.6)

## 2020-01-15 LAB — HEPARIN LEVEL (UNFRACTIONATED): Heparin Unfractionated: 0.69 IU/mL (ref 0.30–0.70)

## 2020-01-15 MED ORDER — SODIUM CHLORIDE 0.9% FLUSH
3.0000 mL | INTRAVENOUS | Status: DC | PRN
  Administered 2020-01-17: 3 mL via INTRAVENOUS

## 2020-01-15 MED ORDER — FUROSEMIDE 10 MG/ML IJ SOLN
40.0000 mg | Freq: Two times a day (BID) | INTRAMUSCULAR | Status: DC
  Administered 2020-01-15 – 2020-01-18 (×6): 40 mg via INTRAVENOUS
  Filled 2020-01-15 (×6): qty 4

## 2020-01-15 MED ORDER — VECURONIUM BROMIDE 10 MG IV SOLR
10.0000 mg | INTRAVENOUS | Status: DC | PRN
  Administered 2020-01-15 – 2020-01-16 (×6): 10 mg via INTRAVENOUS
  Filled 2020-01-15 (×7): qty 10

## 2020-01-15 MED ORDER — ARGATROBAN 50 MG/50ML IV SOLN
2.2800 ug/kg/min | INTRAVENOUS | Status: DC
  Administered 2020-01-15: 0.5 ug/kg/min via INTRAVENOUS
  Administered 2020-01-16: 2.28 ug/kg/min via INTRAVENOUS
  Administered 2020-01-16: 1.1 ug/kg/min via INTRAVENOUS
  Administered 2020-01-16: 1.86 ug/kg/min via INTRAVENOUS
  Administered 2020-01-16: 1.9 ug/kg/min via INTRAVENOUS
  Administered 2020-01-17 (×2): 2.28 ug/kg/min via INTRAVENOUS
  Filled 2020-01-15 (×8): qty 50

## 2020-01-15 MED ORDER — FUROSEMIDE 10 MG/ML IJ SOLN
60.0000 mg | Freq: Once | INTRAMUSCULAR | Status: AC
  Administered 2020-01-15: 60 mg via INTRAVENOUS
  Filled 2020-01-15: qty 6

## 2020-01-15 MED ORDER — SODIUM CHLORIDE 0.9 % IV SOLN
250.0000 mL | INTRAVENOUS | Status: DC | PRN
  Administered 2020-01-17: 250 mL via INTRAVENOUS

## 2020-01-15 MED ORDER — SODIUM CHLORIDE 0.9% FLUSH
3.0000 mL | Freq: Two times a day (BID) | INTRAVENOUS | Status: DC
  Administered 2020-01-15 – 2020-01-18 (×8): 3 mL via INTRAVENOUS

## 2020-01-15 MED ORDER — ALBUMIN HUMAN 25 % IV SOLN
12.5000 g | Freq: Every day | INTRAVENOUS | Status: DC
  Administered 2020-01-15 – 2020-01-19 (×5): 12.5 g via INTRAVENOUS
  Filled 2020-01-15 (×5): qty 50

## 2020-01-15 NOTE — Progress Notes (Signed)
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 Patient is currently intubated, sedated, and on mechanical ventilation.   Nutrition: Tube feeds + free water (180 mL/day) Diuretics: IV Lasix   Goal of Therapy:  Electrolytes WNL  Plan:  On Lasix 40 mg IV BID  No electrolyte replacement indicated today  Will follow peripherally  Carlota Philley A, PharmD 01/15/2020 12:10 PM

## 2020-01-15 NOTE — Progress Notes (Signed)
ANTICOAGULATION CONSULT NOTE  Pharmacy Consult for  Argatroban Indication: pulmonary embolus/  ?HIT  Patient Measurements: Height: 5\' 6"  (167.6 cm) Weight: 117 kg (257 lb 15 oz) IBW/kg (Calculated) : 59.3 Heparin Dosing Weight: 89.15 kg   Labs: Recent Labs    01/13/20 0534 01/13/20 0534 01/14/20 0344 01/15/20 0407 01/15/20 0936 01/15/20 1546  HGB 10.5*   < > 11.7* 10.6*  --   --   HCT 33.8*  --  37.8 34.1*  --   --   PLT 129*  --  80* 35*  --   --   APTT  --   --   --   --  34 35  HEPARINUNFRC 0.63  --  0.49 0.69  --   --   CREATININE 0.51  --  0.53 0.40*  --   --    < > = values in this interval not displayed.   Estimated Creatinine Clearance: 102.1 mL/min (A) (by C-G formula based on SCr of 0.4 mg/dL (L)).  Medical History: Past Medical History:  Diagnosis Date  . Hepatitis   . Hypertension   . Sciatica    Assessment: Patient is a 54 y/o F with medical history as above who is admitted with severe COVID-19 pneumonia with ARDS requiring intubation and mechanical ventilation. Now with concern for pulmonary embolus. Pharmacy has been consulted to initiate heparin infusion for high clinical suspicion for pulmonary embolus.  Heparin level 11/13 @ 0407 = 0.69, PLTs have dropped. H/H stable.  PLTs have trended down 129 > 80 > 35. Notified 59 NP in ICU and will switch to Argatroban 0.29mcg/Kg/min, check HIT panel. Transition to Argatroban drip on 11/13 for possibe HIT  11/13  APTT@ 0936= 34. Will increase Argatroban drip by 30% to 0.65 mcg/kg/min.   Goal of Therapy:  APTT 50-90 seconds for Argatroban Monitor platelets by anticoagulation protocol: Yes   Plan:   11/13 aPTT @ 1546 35 sec.  Subtherapeutic. No argatroban interruptions or line issues. Will  increase Argatroban drip by 30% to 0.845 mcg/kg/min. Nursing aware of changes. Will check aPTT in 4 hrs until therapeutic and stable. CBC daily  12/13, PharmD 01/15/2020,4:49 PM

## 2020-01-15 NOTE — Progress Notes (Addendum)
ANTICOAGULATION CONSULT NOTE  Pharmacy Consult for Heparin >> Argatroban Indication: pulmonary embolus  Patient Measurements: Height: 5\' 6"  (167.6 cm) Weight: 117 kg (257 lb 15 oz) IBW/kg (Calculated) : 59.3 Heparin Dosing Weight: 89.15 kg   Labs: Recent Labs    01/13/20 0534 01/13/20 0534 01/14/20 0344 01/15/20 0407  HGB 10.5*   < > 11.7* 10.6*  HCT 33.8*  --  37.8 34.1*  PLT 129*  --  80* 35*  HEPARINUNFRC 0.63  --  0.49 0.69  CREATININE 0.51  --  0.53 0.40*   < > = values in this interval not displayed.   Estimated Creatinine Clearance: 102.1 mL/min (A) (by C-G formula based on SCr of 0.4 mg/dL (L)).  Medical History: Past Medical History:  Diagnosis Date  . Hepatitis   . Hypertension   . Sciatica    Assessment: Patient is a 54 y/o F with medical history as above who is admitted with severe COVID-19 pneumonia with ARDS requiring intubation and mechanical ventilation. Now with concern for pulmonary embolus. Pharmacy has been consulted to initiate heparin infusion for high clinical suspicion for pulmonary embolus.   Goal of Therapy:  APTT 50-90 seconds for Argatroban Monitor platelets by anticoagulation protocol: Yes   Plan:  Heparin level 11/13 @ 0407 = 0.69, PLTs have dropped. H/H stable.  PLTs have trended down 129 > 80 > 35. Notified 59 NP in ICU and will switch to Argatroban 0.74mcg/Kg/min, check HIT panel. Will check aPTT every 2 hrs until therapeutic and stable.  4m, PharmD 01/15/2020,5:38 AM

## 2020-01-15 NOTE — Progress Notes (Signed)
Patient's O2 86-76 throughout night Provider aware. Patient Afebrile and synchronous on the vent.Provider instructed nurse no to titrate patients sedation, but to contiue to give oral sedation medication.

## 2020-01-15 NOTE — Progress Notes (Signed)
O2 saturation 85-87% on ventilator. Pt not responsive, on continuous paralytic. Per MD discontinue continuous paralytic. Paralytic turned off. 2 twitches at this time. Will re-assess and titrate sedation when pt has 4 twitches.

## 2020-01-15 NOTE — Progress Notes (Signed)
ANTICOAGULATION CONSULT NOTE  Pharmacy Consult for  Argatroban Indication: pulmonary embolus/  ?HIT  Patient Measurements: Height: 5\' 6"  (167.6 cm) Weight: 117 kg (257 lb 15 oz) IBW/kg (Calculated) : 59.3 Heparin Dosing Weight: 89.15 kg   Labs: Recent Labs    01/13/20 0534 01/13/20 0534 01/14/20 0344 01/15/20 0407 01/15/20 0936  HGB 10.5*   < > 11.7* 10.6*  --   HCT 33.8*  --  37.8 34.1*  --   PLT 129*  --  80* 35*  --   APTT  --   --   --   --  34  HEPARINUNFRC 0.63  --  0.49 0.69  --   CREATININE 0.51  --  0.53 0.40*  --    < > = values in this interval not displayed.   Estimated Creatinine Clearance: 102.1 mL/min (A) (by C-G formula based on SCr of 0.4 mg/dL (L)).  Medical History: Past Medical History:  Diagnosis Date  . Hepatitis   . Hypertension   . Sciatica    Assessment: Patient is a 54 y/o F with medical history as above who is admitted with severe COVID-19 pneumonia with ARDS requiring intubation and mechanical ventilation. Now with concern for pulmonary embolus. Pharmacy has been consulted to initiate heparin infusion for high clinical suspicion for pulmonary embolus.  Heparin level 11/13 @ 0407 = 0.69, PLTs have dropped. H/H stable.  PLTs have trended down 129 > 80 > 35. Notified 59 NP in ICU and will switch to Argatroban 0.84mcg/Kg/min, check HIT panel. Transition to Argatroban drip on 11/13 for possibe HIT   Goal of Therapy:  APTT 50-90 seconds for Argatroban Monitor platelets by anticoagulation protocol: Yes   Plan:  11/13  APTT@ 0936= 34. Will increase Argatroban drip by 30% to 0.65 mcg/kg/min.  Will check aPTT in 4 hrs until therapeutic and stable. CBC daily  Renea Schoonmaker A, PharmD 01/15/2020,11:54 AM

## 2020-01-15 NOTE — Progress Notes (Signed)
ANTICOAGULATION CONSULT NOTE  Pharmacy Consult for  Argatroban Indication: pulmonary embolus/  ?HIT  Patient Measurements: Height: 5\' 6"  (167.6 cm) Weight: 117 kg (257 lb 15 oz) IBW/kg (Calculated) : 59.3 Heparin Dosing Weight: 89.15 kg   Labs: Recent Labs    01/13/20 0534 01/13/20 0534 01/14/20 0344 01/15/20 0407 01/15/20 0936 01/15/20 1546  HGB 10.5*   < > 11.7* 10.6*  --   --   HCT 33.8*  --  37.8 34.1*  --   --   PLT 129*  --  80* 35*  --   --   APTT  --   --   --   --  34 35  HEPARINUNFRC 0.63  --  0.49 0.69  --   --   CREATININE 0.51  --  0.53 0.40*  --   --    < > = values in this interval not displayed.   Estimated Creatinine Clearance: 102.1 mL/min (A) (by C-G formula based on SCr of 0.4 mg/dL (L)).  Medical History: Past Medical History:  Diagnosis Date  . Hepatitis   . Hypertension   . Sciatica    Assessment: Patient is a 54 y/o F with medical history as above who is admitted with severe COVID-19 pneumonia with ARDS requiring intubation and mechanical ventilation. Now with concern for pulmonary embolus. Pharmacy has been consulted to initiate heparin infusion for high clinical suspicion for pulmonary embolus.  Heparin level 11/13 @ 0407 = 0.69, PLTs have dropped. H/H stable.  PLTs have trended down 129 > 80 > 35. Notified 59 NP in ICU and will switch to Argatroban 0.91mcg/Kg/min, check HIT panel. Transition to Argatroban drip on 11/13 for possibe HIT   11/13  APTT@ 0936= 34. Will increase Argatroban drip by 30% to 0.65 mcg/kg/min.    11/13 aPTT @ 1546 35 sec.  Subtherapeutic. No argatroban interruptions or line issues. Increased Argatroban drip by 30% to 0.845 mcg/kg/min.  Goal of Therapy:  APTT 50-90 seconds for Argatroban Monitor platelets by anticoagulation protocol: Yes   Plan:  11/13 aPTT @ 2203, 33 sec.  Subtherapeutic. No argatroban interruptions or line issues. Will  increase Argatroban drip by 30% to 1.0985 mcg/kg/min (will round to 1.10  mcg/kg/min). Nursing aware of changes. Will check aPTT in 4 hrs until therapeutic and stable. CBC daily  2204, PharmD 01/15/2020,9:36 PM

## 2020-01-15 NOTE — Progress Notes (Signed)
CRITICAL CARE NOTE 54 year old remote former smoker, with no chronic significant medical issues other than obesity and hypertension, who presents for evaluation of shortness of breath over the last week. The patient states that approximately 6 weeks ago she developed a dry hacking cough which is not unusual for her during "allergy season and change of season". However, approximately a week ago she noted fever and congestion and feeling significantly short of breath. Despite this she went to have her annual medical exam done and had a flu vaccine given at that time. She not vaccinated against COVID-19. She has had some chest discomfort when coughing but not at any other time. No orthopnea or paroxysmal nocturnal dyspnea until last night. Noted that she could not lay flat to sleep. He has not had any abdominal pain, no nausea, vomiting or bowel habit disruption. She has had mild to moderate headache for the last week. Cough became almost constant a day ago which prompted her to come to the emergency room today. Aside from this she voices no other complaint.  She was evaluated at the emergency room where she was noted to be tachypneic and have a temperature of 101.6 F on arrival. She also was noted to have oxygen saturations of 56% on room air. She was placed on heated high flow O2 and nonrebreather mask and is now saturating 100%. PCCM has been asked to admit the patient to stepdown/ICU.  MICRO Covid PCR 12/18/2019 = Positiver [flu PCR negative] Blood culture 12/06/2019-negative Urine culture 12/27/2019-less than 10,000 colonies insignificant growth Blood culture 01/02/2020-negative Respiratory culture tracheal aspirate 01/03/2020 -rare MSSA MRSA PCR 01/04/2020-negative  ANTIBIOTICS Remdesivir 12/18/2019-12/25/2019 Cefepime 12/27/2019-12/27/2019 Cefepime 01/02/2020-01/06/2020 Cefazolin 01/06/2020- x 7 days  10/24 severe hypoxia. COVID Positivie 10/25 intubation and MV support and severe  hypoxia, ETT tube exchanged 10/26-10/31 severe hypoxia and severe ARDS 10/30 - 2h of nimbex and  10/31 early hours - 24h of vec 11/1 high risk for cardiac arrest and death. MSSA on Trch aspiiate 01-27-2023 severe ARDS, s/p proning.  R calf DVT  -started on heparin gtt 11/4 severe hypoxia-did NOT tolerate proning. ECHO 0 normal LV and RV 11/5 - 75% fio32, high peep. Not on pressor.SUpine ventilation. Prn vec. Making urine. On dilaudid gtt and versed gtt. Per Pharmacy got 2h of nimbex and then 24h of continuous on 10/30 - 10/31. +8L since admit. Heparn gtt contiues  01/08/2020- desaturatiion overnight with worsening PF ratio. REdness in neck after niimbex. Now Prone since 0.30 today . On vec gtt with sedation gtt. Fio2 100% , peep 16 -> pulse ox 88%.. Not on perssors. Making urine. Diuresing well. Down t to +4L volume overload since admit. LITD CODe 11/8 remains very sick, severe ARDS, FULL CODE, husband updated 11/10- patient had not tolerated proning. She is making urine. CXR with pulm edema, will diurese and monitor PICC CVP trend only.  11/11-11/12 continue current care, patient unchanged over 24h with severe ARDS.  Poor prognosis maximal support on ventilatory with hypoxemia <70%.  Husband was updated and asked to come in person as patient high risk of cardiac/respiratory arrest. 11/13- patient off paralytics, she is on pressure control without dyssynchrony and lung mechanics.  Plan to continue fully aggressive measures as per husband. Lasix increased today due to improved BP.   CC  follow up respiratory failure  SUBJECTIVE Patient remains critically ill Prognosis is guarded Severe ARDS  Vent Mode: PCV FiO2 (%):  [95 %-100 %] 100 % Set Rate:  [26 bmp-30 bmp] 26 bmp Vt  Set:  [400 mL-4100 mL] 400 mL PEEP:  [12 cmH20-18 cmH20] 12 cmH20 Plateau Pressure:  [32 cmH20-43 cmH20] 32 cmH20 CBC    Component Value Date/Time   WBC 23.7 (H) 01/15/2020 0407   RBC 3.55 (L) 01/15/2020 0407   HGB 10.6  (L) 01/15/2020 0407   HCT 34.1 (L) 01/15/2020 0407   PLT 35 (L) 01/15/2020 0407   MCV 96.1 01/15/2020 0407   MCH 29.9 01/15/2020 0407   MCHC 31.1 01/15/2020 0407   RDW 14.6 01/15/2020 0407   LYMPHSABS 1.2 01/15/2020 0407   MONOABS 1.3 (H) 01/15/2020 0407   EOSABS 0.0 01/15/2020 0407   BASOSABS 0.1 01/15/2020 0407   BMP Latest Ref Rng & Units 01/15/2020 01/14/2020 01/13/2020  Glucose 70 - 99 mg/dL 440(N) 027(O) 536(U)  BUN 6 - 20 mg/dL 44(I) 34(V) 42(V)  Creatinine 0.44 - 1.00 mg/dL 9.56(L) 8.75 6.43  BUN/Creat Ratio 9 - 23 - - -  Sodium 135 - 145 mmol/L 141 141 138  Potassium 3.5 - 5.1 mmol/L 4.6 4.5 4.7  Chloride 98 - 111 mmol/L 90(L) 89(L) 93(L)  CO2 22 - 32 mmol/L 39(H) 40(H) 36(H)  Calcium 8.9 - 10.3 mg/dL 3.2(R) 8.9 5.1(O)      BP 117/64    Pulse 82    Temp 98.4 F (36.9 C) (Axillary)    Resp (!) 26    Ht 5\' 6"  (1.676 m)    Wt 117 kg    LMP 08/16/2014    SpO2 (!) 78%    BMI 41.63 kg/m    I/O last 3 completed shifts: In: 1965 [I.V.:1511.2; NG/GT:453.8] Out: 3025 [Urine:2935; Stool:90] Total I/O In: 1074.5 [I.V.:115.5; NG/GT:959] Out: 550 [Urine:550]  SpO2: (!) 78 % O2 Flow Rate (L/min): 0 L/min (no flow data to report) FiO2 (%): (S) 100 %  Estimated body mass index is 41.63 kg/m as calculated from the following:   Height as of this encounter: 5\' 6"  (1.676 m).   Weight as of this encounter: 117 kg.  SIGNIFICANT EVENTS   REVIEW OF SYSTEMS  PATIENT IS UNABLE TO PROVIDE COMPLETE REVIEW OF SYSTEMS DUE TO SEVERE CRITICAL ILLNESS    PHYSICAL EXAMINATION:  GENERAL:critically ill appearing, +resp distress HEENT - neck supple no JVD mouth with moist mucosa RESPIRATORY - ronchi b/l  CARDIOVASCULAR - NSR no MRG appreciated GI - +bs x4, obese abdomen Musculoskeletal - 1+ edema  NEUROLOGIC: obtunded, GCS<8T   MEDICATIONS: I have reviewed all medications and confirmed regimen as documented   CULTURE RESULTS   No results found for this or any previous visit  (from the past 240 hour(s)).        IMAGING    No results found.   Nutrition Status: Nutrition Problem: Inadequate oral intake Etiology: inability to eat Signs/Symptoms: NPO status Interventions: Tube feeding, Prostat, MVI     Indwelling Urinary Catheter continued, requirement due to   Reason to continue Indwelling Urinary Catheter strict Intake/Output monitoring for hemodynamic instability   Central Line/ continued, requirement due to  Reason to continue 3026 Monitoring of central venous pressure or other hemodynamic parameters and poor IV access   Ventilator continued, requirement due to severe respiratory failure   Ventilator Sedation RASS 0 to -2      ASSESSMENT AND PLAN SYNOPSIS   Acute hypoxemic respiratory failure due to COVID-19 pneumonia / ARDS Mechanical ventilation via ARDS protocol, target PRVC 6 cc/kg Wean PEEP and FiO2 as able Goal plateau pressure less than 30, driving pressure less than 15 Paralytics if  necessary for vent synchrony, gas exchange Cycle prone positioning if necessary for oxygenation Deep sedation per PAD protocol, goal RASS -4, currently fentanyl, midazolam Diuresis as blood pressure and renal function can tolerate, goal CVP 5-8.   diuresis as tolerated based on Kidney function VAP prevention order set Remdesivir  11/10-11/11- plan to minimize IV infusions, change sedation to PO with goal rass negative 4 while paralyzed. Continue diuresis.    ACUTE DIASTOLIC CARDIAC FAILURE-  -Lasix as tolerated   +DVT Continue heparin drip  Morbid obesity, possible OSA.   Will certainly impact respiratory mechanics, ventilator weaning Suspect will need to consider additional PEEP   NEUROLOGY Acute toxic metabolic encephalopathy, need for sedation Goal RASS -2 to -3  CARDIAC ICU monitoring  ID -continue IV abx as prescibed -follow up cultures  GI GI PROPHYLAXIS as indicated   DIET-->TF's as tolerated Constipation  protocol as indicated  ENDO - will use ICU hypoglycemic\Hyperglycemia protocol if indicated     ELECTROLYTES -follow labs as needed -replace as needed -pharmacy consultation and following   DVT/GI PRX ordered and assessed TRANSFUSIONS AS NEEDED MONITOR FSBS I Assessed the need for Labs I Assessed the need for Foley I Assessed the need for Central Venous Line Family Discussion when available I Assessed the need for Mobilization I made an Assessment of medications to be adjusted accordingly Safety Risk assessment completed   CASE DISCUSSED IN MULTIDISCIPLINARY ROUNDS WITH ICU TEAM  Critical Care Time devoted to patient care services described in this note is 33 minutes.   Overall, patient is critically ill, prognosis is guarded.  Patient with Multiorgan failure and at high risk for cardiac arrest and death.     Vida Rigger, M.D.  Pulmonary & Critical Care Medicine  [facility]

## 2020-01-16 ENCOUNTER — Inpatient Hospital Stay: Payer: BC Managed Care – PPO

## 2020-01-16 ENCOUNTER — Encounter: Payer: Self-pay | Admitting: Pulmonary Disease

## 2020-01-16 LAB — GLUCOSE, CAPILLARY
Glucose-Capillary: 225 mg/dL — ABNORMAL HIGH (ref 70–99)
Glucose-Capillary: 150 mg/dL — ABNORMAL HIGH (ref 70–99)
Glucose-Capillary: 165 mg/dL — ABNORMAL HIGH (ref 70–99)
Glucose-Capillary: 191 mg/dL — ABNORMAL HIGH (ref 70–99)
Glucose-Capillary: 208 mg/dL — ABNORMAL HIGH (ref 70–99)
Glucose-Capillary: 209 mg/dL — ABNORMAL HIGH (ref 70–99)

## 2020-01-16 LAB — HEPARIN INDUCED PLATELET AB (HIT ANTIBODY): Heparin Induced Plt Ab: 0.146 OD (ref 0.000–0.400)

## 2020-01-16 LAB — CBC WITH DIFFERENTIAL/PLATELET
Abs Immature Granulocytes: 0.75 10*3/uL — ABNORMAL HIGH (ref 0.00–0.07)
Basophils Absolute: 0 10*3/uL (ref 0.0–0.1)
Basophils Relative: 0 %
Eosinophils Absolute: 0 10*3/uL (ref 0.0–0.5)
Eosinophils Relative: 0 %
HCT: 33.5 % — ABNORMAL LOW (ref 36.0–46.0)
Hemoglobin: 10.1 g/dL — ABNORMAL LOW (ref 12.0–15.0)
Immature Granulocytes: 3 %
Lymphocytes Relative: 4 %
Lymphs Abs: 0.9 10*3/uL (ref 0.7–4.0)
MCH: 29 pg (ref 26.0–34.0)
MCHC: 30.1 g/dL (ref 30.0–36.0)
MCV: 96.3 fL (ref 80.0–100.0)
Monocytes Absolute: 1.3 10*3/uL — ABNORMAL HIGH (ref 0.1–1.0)
Monocytes Relative: 6 %
Neutro Abs: 20.3 10*3/uL — ABNORMAL HIGH (ref 1.7–7.7)
Neutrophils Relative %: 87 %
Platelets: 32 10*3/uL — ABNORMAL LOW (ref 150–400)
RBC: 3.48 MIL/uL — ABNORMAL LOW (ref 3.87–5.11)
RDW: 15.5 % (ref 11.5–15.5)
WBC: 23.3 10*3/uL — ABNORMAL HIGH (ref 4.0–10.5)
nRBC: 0.7 % — ABNORMAL HIGH (ref 0.0–0.2)

## 2020-01-16 LAB — COMPREHENSIVE METABOLIC PANEL
ALT: 41 U/L (ref 0–44)
AST: 39 U/L (ref 15–41)
Albumin: 2.6 g/dL — ABNORMAL LOW (ref 3.5–5.0)
Alkaline Phosphatase: 45 U/L (ref 38–126)
Anion gap: 15 (ref 5–15)
BUN: 48 mg/dL — ABNORMAL HIGH (ref 6–20)
CO2: 38 mmol/L — ABNORMAL HIGH (ref 22–32)
Calcium: 8.4 mg/dL — ABNORMAL LOW (ref 8.9–10.3)
Chloride: 86 mmol/L — ABNORMAL LOW (ref 98–111)
Creatinine, Ser: 0.43 mg/dL — ABNORMAL LOW (ref 0.44–1.00)
GFR, Estimated: >60 mL/min (ref >60)
Glucose, Bld: 219 mg/dL — ABNORMAL HIGH (ref 70–99)
Potassium: 4.9 mmol/L (ref 3.5–5.1)
Sodium: 139 mmol/L (ref 135–145)
Total Bilirubin: 0.9 mg/dL (ref 0.3–1.2)
Total Protein: 6.3 g/dL — ABNORMAL LOW (ref 6.5–8.1)

## 2020-01-16 LAB — APTT
aPTT: 37 seconds — ABNORMAL HIGH (ref 24–36)
aPTT: 39 seconds — ABNORMAL HIGH (ref 24–36)
aPTT: 50 seconds — ABNORMAL HIGH (ref 24–36)
aPTT: 48 seconds — ABNORMAL HIGH (ref 24–36)

## 2020-01-16 LAB — PROTIME-INR
INR: 2.1 — ABNORMAL HIGH (ref 0.8–1.2)
Prothrombin Time: 22.6 seconds — ABNORMAL HIGH (ref 11.4–15.2)

## 2020-01-16 LAB — FIBRINOGEN: Fibrinogen: 459 mg/dL (ref 210–475)

## 2020-01-16 LAB — PHOSPHORUS: Phosphorus: 4.1 mg/dL (ref 2.5–4.6)

## 2020-01-16 MED ORDER — FUROSEMIDE 10 MG/ML IJ SOLN
20.0000 mg | Freq: Once | INTRAMUSCULAR | Status: AC
  Administered 2020-01-16: 20 mg via INTRAVENOUS
  Filled 2020-01-16: qty 2

## 2020-01-16 MED ORDER — CISATRACURIUM BOLUS VIA INFUSION
5.0000 mg | Freq: Once | INTRAVENOUS | Status: DC
  Filled 2020-01-16: qty 5

## 2020-01-16 MED ORDER — ARTIFICIAL TEARS OPHTHALMIC OINT
1.0000 "application " | TOPICAL_OINTMENT | Freq: Three times a day (TID) | OPHTHALMIC | Status: DC
  Filled 2020-01-16: qty 3.5

## 2020-01-16 MED ORDER — MIDODRINE HCL 5 MG PO TABS
10.0000 mg | ORAL_TABLET | Freq: Two times a day (BID) | ORAL | Status: DC
  Administered 2020-01-16 – 2020-01-19 (×8): 10 mg
  Filled 2020-01-16 (×8): qty 2

## 2020-01-16 MED ORDER — VECURONIUM BROMIDE 10 MG IV SOLR
0.0000 ug/kg/min | Status: DC
  Administered 2020-01-16: 1.7 ug/kg/min via INTRAVENOUS
  Administered 2020-01-16: 1 ug/kg/min via INTRAVENOUS
  Administered 2020-01-17: 1.7 ug/kg/min via INTRAVENOUS
  Filled 2020-01-16 (×3): qty 100

## 2020-01-16 MED ORDER — AZITHROMYCIN 500 MG PO TABS
500.0000 mg | ORAL_TABLET | Freq: Every day | ORAL | Status: DC
  Administered 2020-01-17 – 2020-01-19 (×3): 500 mg
  Filled 2020-01-16 (×4): qty 1

## 2020-01-16 MED ORDER — AZITHROMYCIN 500 MG PO TABS
500.0000 mg | ORAL_TABLET | Freq: Every day | ORAL | Status: DC
  Administered 2020-01-16: 500 mg via ORAL
  Filled 2020-01-16: qty 1

## 2020-01-16 MED ORDER — SODIUM CHLORIDE 0.9 % IV SOLN
2.0000 g | INTRAVENOUS | Status: DC
  Administered 2020-01-16 – 2020-01-19 (×4): 2 g via INTRAVENOUS
  Filled 2020-01-16 (×2): qty 2
  Filled 2020-01-16 (×2): qty 20
  Filled 2020-01-16 (×2): qty 2

## 2020-01-16 MED ORDER — VECURONIUM BROMIDE 10 MG IV SOLR
10.0000 mg | Freq: Once | INTRAVENOUS | Status: AC
  Administered 2020-01-16: 10 mg via INTRAVENOUS

## 2020-01-16 MED ORDER — SODIUM CHLORIDE 0.9 % IV SOLN
0.0000 ug/kg/min | INTRAVENOUS | Status: DC
  Filled 2020-01-16: qty 20

## 2020-01-16 NOTE — Progress Notes (Signed)
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 Patient is currently intubated, sedated, and on mechanical ventilation.   Nutrition: Tube feeds + free water  Diuretics: IV Lasix   Goal of Therapy:  Electrolytes WNL  Plan:  On Lasix 40 mg IV BID  No electrolyte replacement indicated at thistime  Will follow peripherally  Jawanna Dykman A, PharmD 01/16/2020 7:24 AM

## 2020-01-16 NOTE — Progress Notes (Signed)
CRITICAL CARE NOTE 54 year old remote former smoker, with no chronic significant medical issues other than obesity and hypertension, who presents for evaluation of shortness of breath over the last week. The patient states that approximately 6 weeks ago she developed a dry hacking cough which is not unusual for her during "allergy season and change of season". However, approximately a week ago she noted fever and congestion and feeling significantly short of breath. Despite this she went to have her annual medical exam done and had a flu vaccine given at that time. She not vaccinated against COVID-19. She has had some chest discomfort when coughing but not at any other time. No orthopnea or paroxysmal nocturnal dyspnea until last night. Noted that she could not lay flat to sleep. He has not had any abdominal pain, no nausea, vomiting or bowel habit disruption. She has had mild to moderate headache for the last week. Cough became almost constant a day ago which prompted her to come to the emergency room today. Aside from this she voices no other complaint.  She was evaluated at the emergency room where she was noted to be tachypneic and have a temperature of 101.6 F on arrival. She also was noted to have oxygen saturations of 56% on room air. She was placed on heated high flow O2 and nonrebreather mask and is now saturating 100%. PCCM has been asked to admit the patient to stepdown/ICU.  MICRO Covid PCR 12/25/2019 = Positiver [flu PCR negative] Blood culture 12/30/2019-negative Urine culture 12/27/2019-less than 10,000 colonies insignificant growth Blood culture 01/02/2020-negative Respiratory culture tracheal aspirate 01/03/2020 -rare MSSA MRSA PCR 01/04/2020-negative  ANTIBIOTICS Remdesivir 12/18/2019-12/19/2019 Cefepime 12/27/2019-12/27/2019 Cefepime 01/02/2020-01/06/2020 Cefazolin 01/06/2020- x 7 days  10/24 severe hypoxia. COVID Positivie 10/25 intubation and MV support and severe  hypoxia, ETT tube exchanged 10/26-10/31 severe hypoxia and severe ARDS 10/30 - 2h of nimbex and  10/31 early hours - 24h of vec 11/1 high risk for cardiac arrest and death. MSSA on Trch aspiiate 01-14-23 severe ARDS, s/p proning.  R calf DVT  -started on heparin gtt 11/4 severe hypoxia-did NOT tolerate proning. ECHO 0 normal LV and RV 11/5 - 75% fio32, high peep. Not on pressor.SUpine ventilation. Prn vec. Making urine. On dilaudid gtt and versed gtt. Per Pharmacy got 2h of nimbex and then 24h of continuous on 10/30 - 10/31. +8L since admit. Heparn gtt contiues  01/08/2020- desaturatiion overnight with worsening PF ratio. REdness in neck after niimbex. Now Prone since 0.30 today . On vec gtt with sedation gtt. Fio2 100% , peep 16 -> pulse ox 88%.. Not on perssors. Making urine. Diuresing well. Down t to +4L volume overload since admit. LITD CODe 11/8 remains very sick, severe ARDS, FULL CODE, husband updated 11/10- patient had not tolerated proning. She is making urine. CXR with pulm edema, will diurese and monitor PICC CVP trend only.  11/11-11/12 continue current care, patient unchanged over 24h with severe ARDS.  Poor prognosis maximal support on ventilatory with hypoxemia <70%.  Husband was updated and asked to come in person as patient high risk of cardiac/respiratory arrest. 11/13- patient off paralytics, she is on pressure control without dyssynchrony and lung mechanics.  Plan to continue fully aggressive measures as per husband. Lasix increased today due to improved BP.  11/14- patient continues to deteriorate despite maximally aggressive care.  Husband at bedside COVID precautions down due to >21d period. Repeat tracheal aspirate, started empiric abx due to fever. RT for vent changes - despite multiple attempts saturations are  at times <70%    CC  follow up respiratory failure  SUBJECTIVE Patient remains critically ill Prognosis is guarded Severe ARDS  Vent Mode: PCV FiO2 (%):  [95  %-100 %] 100 % Set Rate:  [26 bmp] 26 bmp Vt Set:  [400 mL] 400 mL PEEP:  [12 cmH20] 12 cmH20 Plateau Pressure:  [33 cmH20-37 cmH20] 37 cmH20 CBC    Component Value Date/Time   WBC 23.3 (H) 01/16/2020 0310   RBC 3.48 (L) 01/16/2020 0310   HGB 10.1 (L) 01/16/2020 0310   HCT 33.5 (L) 01/16/2020 0310   PLT 32 (L) 01/16/2020 0310   MCV 96.3 01/16/2020 0310   MCH 29.0 01/16/2020 0310   MCHC 30.1 01/16/2020 0310   RDW 15.5 01/16/2020 0310   LYMPHSABS 0.9 01/16/2020 0310   MONOABS 1.3 (H) 01/16/2020 0310   EOSABS 0.0 01/16/2020 0310   BASOSABS 0.0 01/16/2020 0310   BMP Latest Ref Rng & Units 01/16/2020 01/15/2020 01/14/2020  Glucose 70 - 99 mg/dL 935(T) 017(B) 939(Q)  BUN 6 - 20 mg/dL 30(S) 92(Z) 30(Q)  Creatinine 0.44 - 1.00 mg/dL 7.62(U) 6.33(H) 5.45  BUN/Creat Ratio 9 - 23 - - -  Sodium 135 - 145 mmol/L 139 141 141  Potassium 3.5 - 5.1 mmol/L 4.9 4.6 4.5  Chloride 98 - 111 mmol/L 86(L) 90(L) 89(L)  CO2 22 - 32 mmol/L 38(H) 39(H) 40(H)  Calcium 8.9 - 10.3 mg/dL 6.2(B) 6.3(S) 8.9      BP 97/60    Pulse 95    Temp 100.1 F (37.8 C) (Axillary)    Resp (!) 27    Ht 5\' 6"  (1.676 m)    Wt 112.3 kg    LMP 08/16/2014    SpO2 (!) 84%    BMI 39.96 kg/m    I/O last 3 completed shifts: In: 3573.5 [I.V.:1013.1; NG/GT:2520; IV Piggyback:40.4] Out: 4225 [Urine:4225] Total I/O In: 171.8 [I.V.:41.2; NG/GT:130.7] Out: 125 [Urine:125]  SpO2: (!) 84 % O2 Flow Rate (L/min): 0 L/min (no flow data to report) FiO2 (%): 100 %  Estimated body mass index is 39.96 kg/m as calculated from the following:   Height as of this encounter: 5\' 6"  (1.676 m).   Weight as of this encounter: 112.3 kg.  SIGNIFICANT EVENTS   REVIEW OF SYSTEMS  PATIENT IS UNABLE TO PROVIDE COMPLETE REVIEW OF SYSTEMS DUE TO SEVERE CRITICAL ILLNESS    PHYSICAL EXAMINATION:  GENERAL:critically ill appearing, +resp distress HEENT - neck supple no JVD mouth with moist mucosa RESPIRATORY - ronchi b/l  CARDIOVASCULAR -  NSR no MRG appreciated GI - +bs x4, obese abdomen Musculoskeletal - 1+ edema  NEUROLOGIC: obtunded, GCS<8T   MEDICATIONS: I have reviewed all medications and confirmed regimen as documented   CULTURE RESULTS   No results found for this or any previous visit (from the past 240 hour(s)).        IMAGING    No results found.   Nutrition Status: Nutrition Problem: Inadequate oral intake Etiology: inability to eat Signs/Symptoms: NPO status Interventions: Tube feeding, Prostat, MVI     Indwelling Urinary Catheter continued, requirement due to   Reason to continue Indwelling Urinary Catheter strict Intake/Output monitoring for hemodynamic instability   Central Line/ continued, requirement due to  Reason to continue 08/18/2014 Monitoring of central venous pressure or other hemodynamic parameters and poor IV access   Ventilator continued, requirement due to severe respiratory failure   Ventilator Sedation RASS 0 to -2      ASSESSMENT AND PLAN  SYNOPSIS   Acute hypoxemic respiratory failure due to COVID-19 pneumonia / ARDS Mechanical ventilation via ARDS protocol, target PRVC 6 cc/kg Wean PEEP and FiO2 as able Goal plateau pressure less than 30, driving pressure less than 15 Paralytics if necessary for vent synchrony, gas exchange Cycle prone positioning if necessary for oxygenation Deep sedation per PAD protocol, goal RASS -4, currently fentanyl, midazolam Diuresis as blood pressure and renal function can tolerate, goal CVP 5-8.   diuresis as tolerated based on Kidney function VAP prevention order set Remdesivir  11/10-11/11- plan to minimize IV infusions, change sedation to PO with goal rass negative 4 while paralyzed. Continue diuresis.    ACUTE DIASTOLIC CARDIAC FAILURE-  -Lasix as tolerated   +DVT Continue heparin drip  Morbid obesity, possible OSA.   Will certainly impact respiratory mechanics, ventilator weaning Suspect will need to consider  additional PEEP   NEUROLOGY Acute toxic metabolic encephalopathy, need for sedation Goal RASS -2 to -3  CARDIAC ICU monitoring  ID -continue IV abx as prescibed -follow up cultures  GI GI PROPHYLAXIS as indicated   DIET-->TF's as tolerated Constipation protocol as indicated  ENDO - will use ICU hypoglycemic\Hyperglycemia protocol if indicated     ELECTROLYTES -follow labs as needed -replace as needed -pharmacy consultation and following   DVT/GI PRX ordered and assessed TRANSFUSIONS AS NEEDED MONITOR FSBS I Assessed the need for Labs I Assessed the need for Foley I Assessed the need for Central Venous Line Family Discussion when available I Assessed the need for Mobilization I made an Assessment of medications to be adjusted accordingly Safety Risk assessment completed   CASE DISCUSSED IN MULTIDISCIPLINARY ROUNDS WITH ICU TEAM  Critical Care Time devoted to patient care services described in this note is 33 minutes.   Overall, patient is critically ill, prognosis is guarded.  Patient with Multiorgan failure and at high risk for cardiac arrest and death.     Vida Rigger, M.D.  Pulmonary & Critical Care Medicine  [facility]

## 2020-01-16 NOTE — Progress Notes (Signed)
ANTICOAGULATION CONSULT NOTE  Pharmacy Consult for Argatroban Indication: pulmonary embolus/  ?HIT  Patient Measurements: Height: 5\' 6"  (167.6 cm) Weight: 112.3 kg (247 lb 9.2 oz) IBW/kg (Calculated) : 59.3 Heparin Dosing Weight: 89.15 kg   Labs: Recent Labs    01/14/20 0344 01/14/20 0344 01/15/20 0407 01/15/20 0936 01/16/20 0310 01/16/20 0855 01/16/20 1403  HGB 11.7*   < > 10.6*  --  10.1*  --   --   HCT 37.8  --  34.1*  --  33.5*  --   --   PLT 80*  --  35*  --  32*  --   --   APTT  --   --   --    < > 37* 39* 50*  LABPROT  --   --   --   --   --   --  22.6*  INR  --   --   --   --   --   --  2.1*  HEPARINUNFRC 0.49  --  0.69  --   --   --   --   CREATININE 0.53  --  0.40*  --  0.43*  --   --    < > = values in this interval not displayed.   Estimated Creatinine Clearance: 99.8 mL/min (A) (by C-G formula based on SCr of 0.43 mg/dL (L)).  Medical History: Past Medical History:  Diagnosis Date  . Hepatitis   . Hypertension   . Sciatica    Assessment: Patient is a 54 y/o F with medical history as above who is admitted with severe COVID-19 pneumonia with ARDS requiring intubation and mechanical ventilation. Now with concern for pulmonary embolus. Pharmacy has been consulted to initiate heparin infusion for high clinical suspicion for pulmonary embolus.  Heparin level 11/13 @ 0407 = 0.69, PLTs have dropped. H/H stable.  PLTs have trended down 129 > 80 > 35. Notified 59 NP in ICU and will switch to Argatroban 0.48mcg/Kg/min, check HIT panel. Transition to Argatroban drip on 11/13 for possibe HIT   11/13  APTT@ 0936= 34. Will increase Argatroban drip by 30% to 0.65 mcg/kg/min.    11/13 aPTT @ 1546 35 sec.  Subtherapeutic. No argatroban interruptions or line issues. Increased Argatroban drip by 30% to 0.845 mcg/kg/min.   11/14 aPTT @ 0310 37 sec. SUBtherapeutic  Will increase by 30%   11/14 aPTT @ 0855 = 39. Still SUBtherapeutic   Will increase rate by 30% to  1.86 mcg/kg/min (9.2 ml/hr)  11/14 aPTT @ 0855 = 39. Still SUBtherapeutic   Will increase rate by 30% to 1.86 mcg/kg/min (9.2 ml/hr)   Goal of Therapy:  APTT 50-90 seconds for Argatroban Monitor platelets by anticoagulation protocol: Yes   Plan:  11/14 aPTT @ 1403 = 50. Barely therapeutic.    INR 2.1.  Note: Argatroban falsely elevates the INR and effects can be dose related Will increase rate slightly to 1.9 mcg/kg/min (9.39 ml/hr) Will check aPTT in ~4 hrs until therapeutic x2 and stable. CBC daily  Eliabeth Shoff A, PharmD 01/16/2020,2:33 PM

## 2020-01-16 NOTE — Progress Notes (Signed)
ANTICOAGULATION CONSULT NOTE  Pharmacy Consult for Argatroban Indication: pulmonary embolus/  ?HIT  Patient Measurements: Height: 5\' 6"  (167.6 cm) Weight: 112.3 kg (247 lb 9.2 oz) IBW/kg (Calculated) : 59.3 Heparin Dosing Weight: 89.15 kg   Labs: Recent Labs    01/13/20 0534 01/13/20 0534 01/14/20 0344 01/14/20 0344 01/15/20 0407 01/15/20 0936 01/15/20 1546 01/15/20 2129 01/16/20 0310  HGB 10.5*   < > 11.7*   < > 10.6*  --   --   --  10.1*  HCT 33.8*   < > 37.8  --  34.1*  --   --   --  33.5*  PLT 129*   < > 80*  --  35*  --   --   --  32*  APTT  --   --   --   --   --    < > 35 33 37*  HEPARINUNFRC 0.63  --  0.49  --  0.69  --   --   --   --   CREATININE 0.51   < > 0.53  --  0.40*  --   --   --  0.43*   < > = values in this interval not displayed.   Estimated Creatinine Clearance: 99.8 mL/min (A) (by C-G formula based on SCr of 0.43 mg/dL (L)).  Medical History: Past Medical History:  Diagnosis Date  . Hepatitis   . Hypertension   . Sciatica    Assessment: Patient is a 54 y/o F with medical history as above who is admitted with severe COVID-19 pneumonia with ARDS requiring intubation and mechanical ventilation. Now with concern for pulmonary embolus. Pharmacy has been consulted to initiate heparin infusion for high clinical suspicion for pulmonary embolus.  Heparin level 11/13 @ 0407 = 0.69, PLTs have dropped. H/H stable.  PLTs have trended down 129 > 80 > 35. Notified 59 NP in ICU and will switch to Argatroban 0.74mcg/Kg/min, check HIT panel. Transition to Argatroban drip on 11/13 for possibe HIT   11/13  APTT@ 0936= 34. Will increase Argatroban drip by 30% to 0.65 mcg/kg/min.    11/13 aPTT @ 1546 35 sec.  Subtherapeutic. No argatroban interruptions or line issues. Increased Argatroban drip by 30% to 0.845 mcg/kg/min.   11/14 aPTT @ 0310 37 sec. SUBtherapeutic  Will increase by 30%   Goal of Therapy:  APTT 50-90 seconds for Argatroban Monitor platelets  by anticoagulation protocol: Yes   Plan:  11/14 aPTT @ 0310 37 sec. SUBtherapeutic  Will increase rate by 30%  Will check aPTT in 4 hrs until therapeutic and stable. CBC daily  12/14, PharmD 01/16/2020,4:19 AM

## 2020-01-16 NOTE — Progress Notes (Signed)
ANTICOAGULATION CONSULT NOTE  Pharmacy Consult for Argatroban Indication: pulmonary embolus/  ?HIT  Patient Measurements: Height: 5\' 6"  (167.6 cm) Weight: 112.3 kg (247 lb 9.2 oz) IBW/kg (Calculated) : 59.3 Heparin Dosing Weight: 89.15 kg   Labs: Recent Labs    01/14/20 0344 01/14/20 0344 01/15/20 0407 01/15/20 0936 01/15/20 2129 01/16/20 0310 01/16/20 0855  HGB 11.7*   < > 10.6*  --   --  10.1*  --   HCT 37.8  --  34.1*  --   --  33.5*  --   PLT 80*  --  35*  --   --  32*  --   APTT  --   --   --    < > 33 37* 39*  HEPARINUNFRC 0.49  --  0.69  --   --   --   --   CREATININE 0.53  --  0.40*  --   --  0.43*  --    < > = values in this interval not displayed.   Estimated Creatinine Clearance: 99.8 mL/min (A) (by C-G formula based on SCr of 0.43 mg/dL (L)).  Medical History: Past Medical History:  Diagnosis Date  . Hepatitis   . Hypertension   . Sciatica    Assessment: Patient is a 54 y/o F with medical history as above who is admitted with severe COVID-19 pneumonia with ARDS requiring intubation and mechanical ventilation. Now with concern for pulmonary embolus. Pharmacy has been consulted to initiate heparin infusion for high clinical suspicion for pulmonary embolus.  Heparin level 11/13 @ 0407 = 0.69, PLTs have dropped. H/H stable.  PLTs have trended down 129 > 80 > 35. Notified 59 NP in ICU and will switch to Argatroban 0.78mcg/Kg/min, check HIT panel. Transition to Argatroban drip on 11/13 for possibe HIT   11/13  APTT@ 0936= 34. Will increase Argatroban drip by 30% to 0.65 mcg/kg/min.    11/13 aPTT @ 1546 35 sec.  Subtherapeutic. No argatroban interruptions or line issues. Increased Argatroban drip by 30% to 0.845 mcg/kg/min.   11/14 aPTT @ 0310 37 sec. SUBtherapeutic  Will increase by 30% 11/14 aPTT @ 0855 = 39. Still SUBtherapeutic   Will increase rate by 30% to 1.86 mcg/kg/min (9.2 ml/hr)   Goal of Therapy:  APTT 50-90 seconds for  Argatroban Monitor platelets by anticoagulation protocol: Yes   Plan:  11/14 aPTT @ 0855 = 39. Still SUBtherapeutic   Will increase rate by 30% to 1.86 mcg/kg/min (9.2 ml/hr) Will check aPTT in ~2 hrs until therapeutic and stable. CBC daily  Analaura Messler A, PharmD 01/16/2020,9:42 AM

## 2020-01-16 NOTE — Progress Notes (Signed)
ANTICOAGULATION CONSULT NOTE  Pharmacy Consult for Argatroban Indication: pulmonary embolus/  ?HIT  Patient Measurements: Height: 5\' 6"  (167.6 cm) Weight: 112.3 kg (247 lb 9.2 oz) IBW/kg (Calculated) : 59.3 Heparin Dosing Weight: 89.15 kg   Labs: Recent Labs    01/14/20 0344 01/14/20 0344 01/15/20 0407 01/15/20 0936 01/16/20 0310 01/16/20 0310 01/16/20 0855 01/16/20 1403 01/16/20 2009  HGB 11.7*   < > 10.6*  --  10.1*  --   --   --   --   HCT 37.8  --  34.1*  --  33.5*  --   --   --   --   PLT 80*  --  35*  --  32*  --   --   --   --   APTT  --   --   --    < > 37*   < > 39* 50* 48*  LABPROT  --   --   --   --   --   --   --  22.6*  --   INR  --   --   --   --   --   --   --  2.1*  --   HEPARINUNFRC 0.49  --  0.69  --   --   --   --   --   --   CREATININE 0.53  --  0.40*  --  0.43*  --   --   --   --    < > = values in this interval not displayed.   Estimated Creatinine Clearance: 99.8 mL/min (A) (by C-G formula based on SCr of 0.43 mg/dL (L)).  Medical History: Past Medical History:  Diagnosis Date  . Hepatitis   . Hypertension   . Sciatica    Assessment: Patient is a 54 y/o F with medical history as above who is admitted with severe COVID-19 pneumonia with ARDS requiring intubation and mechanical ventilation. Now with concern for pulmonary embolus. Pharmacy has been consulted to initiate heparin infusion for high clinical suspicion for pulmonary embolus.  Heparin level 11/13 @ 0407 = 0.69, PLTs have dropped. H/H stable.  PLTs have trended down 129 > 80 > 35. Notified 59 NP in ICU and will switch to Argatroban 0.9mcg/Kg/min, check HIT panel. Transition to Argatroban drip on 11/13 for possibe HIT   11/13  APTT@ 0936= 34. Will increase Argatroban drip by 30% to 0.65 mcg/kg/min.    11/13 aPTT @ 1546 35 sec.  Subtherapeutic. No argatroban interruptions or line issues. Increased Argatroban drip by 30% to 0.845 mcg/kg/min.   11/14 aPTT @ 0310 37 sec.  SUBtherapeutic  Will increase by 30%   11/14 aPTT @ 0855 = 39. Still SUBtherapeutic   Will increase rate by 30% to 1.86 mcg/kg/min   11/14 aPTT @ 0855 = 39. Still SUBtherapeutic   Will increase rate by 30% to 1.86 mcg/kg/min   11/14 aPTT @ 1403 = 50. Barely therapeutic. Increased rate slightly to 1.9 mcg/kg/min (9.39 ml/hr)   Goal of Therapy:  APTT 50-90 seconds for Argatroban Monitor platelets by anticoagulation protocol: Yes   Plan:  11/14 aPTT @ 2009 = 48. SUBtherapeutic. Confirmed with nursing no interruptions or line issues. Will increase rate slightly to 2.28 mcg/kg/min (~20% increase; 11.27 mL/hr) - nursing aware of changes.  Will check aPTT in ~4 hrs until therapeutic x2 and stable. CBC daily  2010, PharmD 01/16/2020,9:31 PM

## 2020-01-16 NOTE — TOC Progression Note (Signed)
Transition of Care [facility]) - Progression Note    Patient Details  Name: [name] MRN: [id number] Date of Birth: 12/22/1965  Transition of Care [facility]) CM/SW Contact  Bing Quarry, RN Phone Number: 01/16/2020, 4:40 PM  Clinical Narrative:   11/14 11/14 Remains on vent with ARDS. Still Full Code. Poor prognosis despite aggressive care per provider. Gabriel Cirri RN CM    Expected Discharge Plan: Home/Self Care Barriers to Discharge: Continued Medical Work up  Expected Discharge Plan and Services Expected Discharge Plan: Home/Self Care In-house Referral: Clinical Social Work     Living arrangements for the past 2 months: Mobile Home                                       Social Determinants of Health (SDOH) Interventions    Readmission Risk Interventions No flowsheet data found.

## 2020-01-17 DIAGNOSIS — U071 COVID-19: Secondary | ICD-10-CM | POA: Diagnosis not present

## 2020-01-17 DIAGNOSIS — Z7189 Other specified counseling: Secondary | ICD-10-CM

## 2020-01-17 DIAGNOSIS — J9600 Acute respiratory failure, unspecified whether with hypoxia or hypercapnia: Secondary | ICD-10-CM | POA: Diagnosis not present

## 2020-01-17 DIAGNOSIS — Z515 Encounter for palliative care: Secondary | ICD-10-CM | POA: Diagnosis not present

## 2020-01-17 LAB — GLUCOSE, CAPILLARY
Glucose-Capillary: 186 mg/dL — ABNORMAL HIGH (ref 70–99)
Glucose-Capillary: 196 mg/dL — ABNORMAL HIGH (ref 70–99)
Glucose-Capillary: 210 mg/dL — ABNORMAL HIGH (ref 70–99)
Glucose-Capillary: 255 mg/dL — ABNORMAL HIGH (ref 70–99)
Glucose-Capillary: 267 mg/dL — ABNORMAL HIGH (ref 70–99)
Glucose-Capillary: 299 mg/dL — ABNORMAL HIGH (ref 70–99)

## 2020-01-17 LAB — COMPREHENSIVE METABOLIC PANEL WITH GFR
ALT: 53 U/L — ABNORMAL HIGH (ref 0–44)
AST: 51 U/L — ABNORMAL HIGH (ref 15–41)
Albumin: 2.7 g/dL — ABNORMAL LOW (ref 3.5–5.0)
Alkaline Phosphatase: 45 U/L (ref 38–126)
Anion gap: 16 — ABNORMAL HIGH (ref 5–15)
BUN: 61 mg/dL — ABNORMAL HIGH (ref 6–20)
CO2: 39 mmol/L — ABNORMAL HIGH (ref 22–32)
Calcium: 8.3 mg/dL — ABNORMAL LOW (ref 8.9–10.3)
Chloride: 89 mmol/L — ABNORMAL LOW (ref 98–111)
Creatinine, Ser: 0.54 mg/dL (ref 0.44–1.00)
GFR, Estimated: >60 mL/min
Glucose, Bld: 268 mg/dL — ABNORMAL HIGH (ref 70–99)
Potassium: 5.3 mmol/L — ABNORMAL HIGH (ref 3.5–5.1)
Sodium: 144 mmol/L (ref 135–145)
Total Bilirubin: 0.8 mg/dL (ref 0.3–1.2)
Total Protein: 6.3 g/dL — ABNORMAL LOW (ref 6.5–8.1)

## 2020-01-17 LAB — CBC WITH DIFFERENTIAL/PLATELET
Abs Immature Granulocytes: 0.71 10*3/uL — ABNORMAL HIGH (ref 0.00–0.07)
Basophils Absolute: 0.1 10*3/uL (ref 0.0–0.1)
Basophils Relative: 0 %
Eosinophils Absolute: 0 10*3/uL (ref 0.0–0.5)
Eosinophils Relative: 0 %
HCT: 34.1 % — ABNORMAL LOW (ref 36.0–46.0)
Hemoglobin: 10 g/dL — ABNORMAL LOW (ref 12.0–15.0)
Immature Granulocytes: 3 %
Lymphocytes Relative: 5 %
Lymphs Abs: 1.2 10*3/uL (ref 0.7–4.0)
MCH: 29.6 pg (ref 26.0–34.0)
MCHC: 29.3 g/dL — ABNORMAL LOW (ref 30.0–36.0)
MCV: 100.9 fL — ABNORMAL HIGH (ref 80.0–100.0)
Monocytes Absolute: 1.1 10*3/uL — ABNORMAL HIGH (ref 0.1–1.0)
Monocytes Relative: 5 %
Neutro Abs: 22 10*3/uL — ABNORMAL HIGH (ref 1.7–7.7)
Neutrophils Relative %: 87 %
Platelets: 44 10*3/uL — ABNORMAL LOW (ref 150–400)
RBC: 3.38 MIL/uL — ABNORMAL LOW (ref 3.87–5.11)
RDW: 15.9 % — ABNORMAL HIGH (ref 11.5–15.5)
Smear Review: DECREASED
WBC: 25.1 10*3/uL — ABNORMAL HIGH (ref 4.0–10.5)
nRBC: 1.3 % — ABNORMAL HIGH (ref 0.0–0.2)

## 2020-01-17 LAB — MAGNESIUM: Magnesium: 2.5 mg/dL — ABNORMAL HIGH (ref 1.7–2.4)

## 2020-01-17 LAB — POTASSIUM: Potassium: 5 mmol/L (ref 3.5–5.1)

## 2020-01-17 LAB — PHOSPHORUS: Phosphorus: 5.4 mg/dL — ABNORMAL HIGH (ref 2.5–4.6)

## 2020-01-17 LAB — APTT
aPTT: 56 s — ABNORMAL HIGH (ref 24–36)
aPTT: 63 seconds — ABNORMAL HIGH (ref 24–36)

## 2020-01-17 MED ORDER — HEPARIN (PORCINE) 25000 UT/250ML-% IV SOLN
1650.0000 [IU]/h | INTRAVENOUS | Status: DC
  Administered 2020-01-17 – 2020-01-18 (×2): 1750 [IU]/h via INTRAVENOUS
  Filled 2020-01-17 (×2): qty 250

## 2020-01-17 MED ORDER — IBUPROFEN 100 MG/5ML PO SUSP
600.0000 mg | Freq: Once | ORAL | Status: AC
  Administered 2020-01-17: 600 mg
  Filled 2020-01-17: qty 30

## 2020-01-17 NOTE — Progress Notes (Signed)
Inpatient Diabetes Program Recommendations  AACE/ADA: New Consensus Statement on Inpatient Glycemic Control  Target Ranges:  Prepandial:   less than 140 mg/dL      Peak postprandial:   less than 180 mg/dL (1-2 hours)      Critically ill patients:  140 - 180 mg/dL   Results for Carrie Davies, Carrie Davies (MRN [id number]) as of 01/17/2020 08:57  Ref. Range 01/16/2020 07:44 01/16/2020 11:24 01/16/2020 16:10 01/16/2020 20:01 01/16/2020 23:25 01/17/2020 03:30 01/17/2020 07:14  Glucose-Capillary Latest Ref Range: 70 - 99 mg/dL 103 (H) 159 (H) 458 (H) 208 (H) 209 (H) 267 (H) 210 (H)   Review of Glycemic Control  Current orders for Inpatient glycemic control: Novolog 0-20 units Q4H; Solumedrol 20 mg BID, Vital @ 70 ml/hr  Inpatient Diabetes Program Recommendations:    Insulin: Please consider ordering Novolog 4 units Q4H for tube feeding coverage. If tube feeding is stopped or held then Novolog tube feeding coverage should also be stopped or held.  Thanks, Orlando Penner, RN, MSN, CDE Diabetes Coordinator Inpatient Diabetes Program [phone] (Team Pager from 8am to 5pm)

## 2020-01-17 NOTE — Progress Notes (Signed)
GOALS OF CARE DISCUSSION  The Clinical status was relayed to family in detail. Husband at bedside  Updated and notified of patients medical condition.  patient with increased WOB and using accessory muscles to breathe Explained to family course of therapy and the modalities     Patient with Progressive multiorgan failure with very low chance of meaningful recovery despite all aggressive and optimal medical therapy. Patient is in the Dying  Process associated with Suffering.  Family understands the situation.  They have consented and agreed to DNR.  Family are satisfied with Plan of action and management. All questions answered  Additional CC time 32 mins   Christina Waldrop Santiago Glad, M.D.  [facility]  Medical Director [facility] Medical Director [facility] Cardio-Pulmonary Department

## 2020-01-17 NOTE — Progress Notes (Signed)
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 Patient is currently intubated, sedated, and on mechanical ventilation.   Nutrition: Tube feeds + free water  Diuretics: IV Lasix   Goal of Therapy:  Electrolytes WNL  Plan:   Patient slightly hyperkalemic on diuresis. No interventions at this time  Will follow peripherally  Pricilla Riffle, PharmD 01/17/2020 12:41 PM

## 2020-01-17 NOTE — Progress Notes (Signed)
[facility] visited pt.'s rm. upon seeing husband at bedside; husband tearful in sharing that pt. has not moved or responded despite sedation and paralytic meds being weaned; 'I don't know what to do' husband mused aloud, tearfully.  Visit interrupted by call from husband's sister; [facility] excused himself but hopes to follow up later today or tomorrow.

## 2020-01-17 NOTE — Progress Notes (Signed)
ANTICOAGULATION CONSULT NOTE  Pharmacy Consult for Argatroban Indication: pulmonary embolus/  ?HIT  Patient Measurements: Height: 5\' 6"  (167.6 cm) Weight: 112.3 kg (247 lb 9.2 oz) IBW/kg (Calculated) : 59.3 Heparin Dosing Weight: 89.15 kg   Labs: Recent Labs    01/15/20 0407 01/15/20 0936 01/16/20 0310 01/16/20 0855 01/16/20 1403 01/16/20 2009 01/17/20 0238  HGB 10.6*  --  10.1*  --   --   --  10.0*  HCT 34.1*  --  33.5*  --   --   --  34.1*  PLT 35*  --  32*  --   --   --  44*  APTT  --    < > 37*   < > 50* 48* 56*  LABPROT  --   --   --   --  22.6*  --   --   INR  --   --   --   --  2.1*  --   --   HEPARINUNFRC 0.69  --   --   --   --   --   --   CREATININE 0.40*  --  0.43*  --   --   --  0.54   < > = values in this interval not displayed.   Estimated Creatinine Clearance: 99.8 mL/min (by C-G formula based on SCr of 0.54 mg/dL).  Medical History: Past Medical History:  Diagnosis Date  . Hepatitis   . Hypertension   . Sciatica    Assessment: Patient is a 54 y/o F with medical history as above who is admitted with severe COVID-19 pneumonia with ARDS requiring intubation and mechanical ventilation. Now with concern for pulmonary embolus. Pharmacy has been consulted to initiate heparin infusion for high clinical suspicion for pulmonary embolus.  Heparin level 11/13 @ 0407 = 0.69, PLTs have dropped. H/H stable.  PLTs have trended down 129 > 80 > 35. Notified 59 NP in ICU and will switch to Argatroban 0.90mcg/Kg/min, check HIT panel. Transition to Argatroban drip on 11/13 for possibe HIT   11/13  APTT@ 0936= 34. Will increase Argatroban drip by 30% to 0.65 mcg/kg/min.    11/13 aPTT @ 1546 35 sec.  Subtherapeutic. No argatroban interruptions or line issues. Increased Argatroban drip by 30% to 0.845 mcg/kg/min.   11/14 aPTT @ 0310 37 sec. SUBtherapeutic  Will increase by 30%   11/14 aPTT @ 0855 = 39. Still SUBtherapeutic   Will increase rate by 30% to 1.86  mcg/kg/min   11/14 aPTT @ 0855 = 39. Still SUBtherapeutic   Will increase rate by 30% to 1.86 mcg/kg/min   11/14 aPTT @ 1403 = 50. Barely therapeutic. Increased rate slightly to 1.9 mcg/kg/min (9.39 ml/hr)  11/15 aPTT at 0238 = 56, therapeutic x 2.  H/H stable, PLTs slightly improved   Goal of Therapy:  APTT 50-90 seconds for Argatroban Monitor platelets by anticoagulation protocol: Yes   Plan:  11/15 aPTT at 0238 = 56 therapeutic x 2 Will check aPTT in ~4 hrs  CBC daily  12/15, PharmD 01/17/2020,3:55 AM

## 2020-01-17 NOTE — Progress Notes (Signed)
CRITICAL CARE NOTE 54 year old remote former smoker, with no chronic significant medical issues other than obesity and hypertension, who presents for evaluation of shortness of breath over the last week. The patient states that approximately 6 weeks ago she developed a dry hacking cough which is not unusual for her during "allergy season and change of season". However, approximately a week ago she noted fever and congestion and feeling significantly short of breath. Despite this she went to have her annual medical exam done and had a flu vaccine given at that time. She not vaccinated against COVID-19. She has had some chest discomfort when coughing but not at any other time. No orthopnea or paroxysmal nocturnal dyspnea until last night. Noted that she could not lay flat to sleep. He has not had any abdominal pain, no nausea, vomiting or bowel habit disruption. She has had mild to moderate headache for the last week. Cough became almost constant a day ago which prompted her to come to the emergency room today. Aside from this she voices no other complaint.  She was evaluated at the emergency room where she was noted to be tachypneic and have a temperature of 101.6 F on arrival. She also was noted to have oxygen saturations of 56% on room air. She was placed on heated high flow O2 and nonrebreather mask and is now saturating 100%. PCCM has been asked to admit the patient to stepdown/ICU.  MICRO Covid PCR 2020-01-14 =Positiver [flu PCR negative] Blood culture 01-14-2020-negative Urine culture 12/27/2019-less than 10,000 colonies insignificant growth Blood culture 01/02/2020-negative Respiratory culture tracheal aspirate 01/03/2020 -rare MSSA MRSA PCR 01/04/2020-negative  ANTIBIOTICS Remdesivir 2020-01-14-2020-01-14 Cefepime 12/27/2019-12/27/2019 Cefepime 01/02/2020-01/06/2020 Cefazolin 01/06/2020-x 7 days  10/24 severe hypoxia. COVID Positivie 10/25 intubation and MV support and  severe hypoxia, ETT tube exchanged 10/26-10/31 severe hypoxia and severe ARDS 10/30 - 2h of nimbex and  10/31 early hours - 24h of vec 11/1 high risk for cardiac arrest and death. MSSA on Trch aspiiate 01-24-23 severe ARDS, s/p proning. R calf DVT -started on heparin gtt 11/4 severe hypoxia-did NOT tolerate proning. ECHO 0 normal LV and RV 11/5 - 75% fio32, high peep. Not on pressor.SUpine ventilation. Prn vec. Making urine. On dilaudid gtt and versed gtt. Per Pharmacy got 2h of nimbex and then 24h of continuous on 10/30 - 10/31. +8L since admit. Heparn gtt contiues  01/08/2020-desaturatiion overnight with worsening PF ratio. REdness in neck after niimbex. Now Prone since 0.30 today . On vec gtt with sedation gtt. Fio2 100% , peep 16 -> pulse ox 88%.. Not on perssors. Making urine. Diuresing well. Down t to +4L volume overload since admit.LITD CODe 11/8 remains very sick, severe ARDS, FULL CODE, husband updated 11/10- patient had not tolerated proning. She is making urine. CXR with pulm edema, will diurese and monitor PICC CVP trend only.  11/11-11/12 continue current care, patient unchanged over 24h with severe ARDS.  Poor prognosis maximal support on ventilatory with hypoxemia <70%.  Husband was updated and asked to come in person as patient high risk of cardiac/respiratory arrest. 11/13- patient off paralytics, she is on pressure control without dyssynchrony and lung mechanics.  Plan to continue fully aggressive measures as per husband. Lasix increased today due to improved BP.  11/14- patient continues to deteriorate despite maximally aggressive care.  Husband at bedside COVID precautions down due to >21d period. Repeat tracheal aspirate, started empiric abx due to fever. RT for vent changes - despite multiple attempts saturations are at times <70% 11/15 severe hypoxia,  high risk for death, remains on paralytics, needs trach   CC  follow up respiratory failure  HPI Patient remains  critically ill Prognosis is guarded  overall prognosis is very poor Patient developing post inflammatory fibrosis Severe hypoxia Signs of Critical illness polyneuropathy    Vent Mode: PRVC FiO2 (%):  [100 %] 100 % Set Rate:  [26 bmp] 26 bmp Vt Set:  [460 mL] 460 mL PEEP:  [12 cmH20-16 cmH20] 16 cmH20 Plateau Pressure:  [17 cmH20-38 cmH20] 38 cmH20  CBC    Component Value Date/Time   WBC 25.1 (H) 01/17/2020 0238   RBC 3.38 (L) 01/17/2020 0238   HGB 10.0 (L) 01/17/2020 0238   HCT 34.1 (L) 01/17/2020 0238   PLT 44 (L) 01/17/2020 0238   MCV 100.9 (H) 01/17/2020 0238   MCH 29.6 01/17/2020 0238   MCHC 29.3 (L) 01/17/2020 0238   RDW 15.9 (H) 01/17/2020 0238   LYMPHSABS 1.2 01/17/2020 0238   MONOABS 1.1 (H) 01/17/2020 0238   EOSABS 0.0 01/17/2020 0238   BASOSABS 0.1 01/17/2020 0238   CMP Latest Ref Rng & Units 01/17/2020 01/16/2020 01/15/2020  Glucose 70 - 99 mg/dL 338(S) 505(L) 976(B)  BUN 6 - 20 mg/dL 34(L) 93(X) 90(W)  Creatinine 0.44 - 1.00 mg/dL 4.09 7.35(H) 2.99(M)  Sodium 135 - 145 mmol/L 144 139 141  Potassium 3.5 - 5.1 mmol/L 5.3(H) 4.9 4.6  Chloride 98 - 111 mmol/L 89(L) 86(L) 90(L)  CO2 22 - 32 mmol/L 39(H) 38(H) 39(H)  Calcium 8.9 - 10.3 mg/dL 8.3(L) 8.4(L) 8.5(L)  Total Protein 6.5 - 8.1 g/dL 6.3(L) 6.3(L) 6.5  Total Bilirubin 0.3 - 1.2 mg/dL 0.8 0.9 0.7  Alkaline Phos 38 - 126 U/L 45 45 46  AST 15 - 41 U/L 51(H) 39 30  ALT 0 - 44 U/L 53(H) 41 41      BP 98/68   Pulse (!) 131   Temp (!) 100.4 F (38 C)   Resp (!) 26   Ht 5\' 6"  (1.676 m)   Wt 110.4 kg   LMP 08/16/2014   SpO2 (!) 81%   BMI 39.28 kg/m    I/O last 3 completed shifts: In: 3929.7 [I.V.:1059.8; NG/GT:2729.2; IV Piggyback:140.7] Out: 4675 [Urine:4650; Stool:25] No intake/output data recorded.  SpO2: (!) 81 % O2 Flow Rate (L/min): 0 L/min (no flow data to report) FiO2 (%): 100 %  Estimated body mass index is 39.28 kg/m as calculated from the following:   Height as of this  encounter: 5\' 6"  (1.676 m).   Weight as of this encounter: 110.4 kg.  SIGNIFICANT EVENTS   REVIEW OF SYSTEMS  PATIENT IS UNABLE TO PROVIDE COMPLETE REVIEW OF SYSTEMS DUE TO SEVERE CRITICAL ILLNESS        PHYSICAL EXAMINATION:  GENERAL:critically ill appearing, +resp distress NECK: Supple.  PULMONARY: +rhonchi, +wheezing CARDIOVASCULAR: S1 and S2. Regular rate and rhythm. No murmurs, rubs, or gallops.  GASTROINTESTINAL: Soft, nontender, -distended.  Positive bowel sounds.   MUSCULOSKELETAL: No swelling, clubbing, or edema.  NEUROLOGIC: obtunded, GCS<8 SKIN:intact,warm,dry  MEDICATIONS: I have reviewed all medications and confirmed regimen as documented   CULTURE RESULTS   Recent Results (from the past 240 hour(s))  Culture, respiratory     Status: None (Preliminary result)   Collection Time: 01/16/20  4:01 PM   Specimen: Tracheal Aspirate; Respiratory  Result Value Ref Range Status   Specimen Description   Final    TRACHEAL ASPIRATE Performed at [facility] [address] [place], [address] [place]  Special Requests   Final    NONE Performed at [facility], [address] [place], [place] [address]    Gram Stain   Final    MODERATE WBC PRESENT, PREDOMINANTLY PMN MODERATE GRAM NEGATIVE RODS RARE GRAM POSITIVE COCCI IN CLUSTERS Performed at [facility], [address], [place], [place] [address]    Culture PENDING  Incomplete   Report Status PENDING  Incomplete          IMAGING    DG Chest Port 1 View  Result Date: 01/16/2020 CLINICAL DATA:  Hypertension.  COVID-19 positive.  Hypoxia. EXAM: PORTABLE CHEST 1 VIEW COMPARISON:  January 16, 2020 study obtained earlier in the day FINDINGS: Endotracheal tube tip is 3.9 cm above the carina. Central catheter tip is in the superior vena cava. Nasogastric tube tip and side port are below the diaphragm. No pneumothorax. Widespread airspace opacity and interstitial thickening again  noted throughout the lungs, similar to earlier in the day. Heart is borderline prominent with pulmonary vascularity normal. No adenopathy. No bone lesions. IMPRESSION: Persistent interstitial thickening and widespread airspace opacity. Question edema versus multifocal pneumonia; both entities may be present concurrently. Tube and catheter positions as described without evident pneumothorax. Essentially stable cardiac silhouette. Electronically Signed   By: Bretta Bang III M.D.   On: 01/16/2020 12:14     Nutrition Status: Nutrition Problem: Inadequate oral intake Etiology: inability to eat Signs/Symptoms: NPO status Interventions: Tube feeding, Prostat, MVI     Indwelling Urinary Catheter continued, requirement due to   Reason to continue Indwelling Urinary Catheter strict Intake/Output monitoring for hemodynamic instability   Central Line/ continued, requirement due to  Reason to continue [employer] Monitoring of central venous pressure or other hemodynamic parameters and poor IV access   Ventilator continued, requirement due to severe respiratory failure   Ventilator Sedation RASS 0 to -2      ASSESSMENT AND PLAN SYNOPSIS   Severe ACUTE Hypoxic and Hypercapnic Respiratory Failure due to COVID 19 pneumonia With severe ARDS, failure to wean from vent -continue Full MV support -continue Bronchodilator Therapy -Wean Fio2 and PEEP as tolerated -VAP/VENT bundle implementation  Morbid obesity, possible OSA.   Will certainly impact respiratory mechanics, ventilator weaning Suspect will need to consider additional PEEP   NEUROLOGY Acute toxic metabolic encephalopathy, need for sedation Goal RASS -2 to -3  CARDIAC ICU monitoring  GI GI PROPHYLAXIS as indicated  NUTRITIONAL STATUS Nutrition Status: Nutrition Problem: Inadequate oral intake Etiology: inability to eat Signs/Symptoms: NPO status Interventions: Tube feeding, Prostat, MVI   DIET-->TF's as  tolerated Constipation protocol as indicated  ENDO - will use ICU hypoglycemic\Hyperglycemia protocol if indicated     ELECTROLYTES -follow labs as needed -replace as needed -pharmacy consultation and following   DVT/GI PRX ordered and assessed TRANSFUSIONS AS NEEDED MONITOR FSBS I Assessed the need for Labs I Assessed the need for Foley I Assessed the need for Central Venous Line Family Discussion when available I Assessed the need for Mobilization I made an Assessment of medications to be adjusted accordingly Safety Risk assessment completed   CASE DISCUSSED IN MULTIDISCIPLINARY ROUNDS WITH ICU TEAM  Critical Care Time devoted to patient care services described in this note is 56 minutes.   Overall, patient is critically ill, prognosis is guarded.  Patient with Multiorgan failure and at high risk for cardiac arrest and death.    Lucie Leather, M.D.  [facility]  Medical Director [facility] Medical  Director [facility] Cardio-Pulmonary Department

## 2020-01-17 NOTE — Progress Notes (Signed)
Neuro: Vecuronium turned off this AM and remains off with no movement to any stimulus.  Dilaudid and Versed was placed at 50% for a wake up assessment-patient did not show any signs of increased mental status/alertness.  Per Dr. Belia Heman, it was returned to 100% for comfort to officially end the wake up assessment.   Resp: Patient is tolerating ventilator settings with the FiO2 set at 100%. This afternoon she has begun to have intermittent agonal breathing behavior. He O2 saturations have steadily declined over the course of the day from the 80s to the 40s. Secretions remain the same, tan and thick.   CV: TMAX 38.5, ice packs applied, room cooled, no blanket applied. Pulses remain strong though heart rate has steadily increased to be sustained in the 130s. Blood pressure continues to vary, see flowsheets.  HITT profile was negative, changed infusion back to Heparin.  GIGU: Patient continues to tolerate feeds.  Fecal management system remains in place with decreased output.  Foley remains in place.   Skin: Patient's skin is clean, dry, and intact with generalized bruising and a small abrasion on her upper right thigh.   Social: Husband at the bedside throughout the day.  He has been updated by Dr. Belia Heman and chose to make the patient a DNR.  This RN expressed to the husband it would be allowed for the patient's son to come visit to "say his goodbyes" due to the patient's deteriorating status.

## 2020-01-17 NOTE — Progress Notes (Signed)
Temperature elevated, PRN Tylenol given as ordered with little improvement noted. Ice packs placed to patients underarms with little improvement noted. T-max high was 102.4, NP notified and new orders received; also place ice packs to patients BL groin, Ibuprofen 600mg  per tube once and continue to monitor, if no improvement, patient may be placed on a cooling blanket to help control temperature.

## 2020-01-17 NOTE — Progress Notes (Signed)
Sedation decreased by half, vecuronium turned off for trial.  No movement to painful stimulus 1 hour after vecuronium turned off. Will continue to leave Vecuronium off, O2 saturation has remained in the 70s-80s, heart rate has decreased to less then 125.

## 2020-01-17 NOTE — Progress Notes (Signed)
ANTICOAGULATION CONSULT NOTE  Pharmacy Consult for heparin Indication: pulmonary embolus  Patient Measurements: Height: 5\' 6"  (167.6 cm) Weight: 110.4 kg (243 lb 6.2 oz) IBW/kg (Calculated) : 59.3 Heparin Dosing Weight: 89.15 kg   Labs: Recent Labs    01/15/20 0407 01/15/20 0936 01/16/20 0310 01/16/20 0855 01/16/20 1403 01/16/20 1403 01/16/20 2009 01/17/20 0238 01/17/20 0652  HGB 10.6*  --  10.1*  --   --   --   --  10.0*  --   HCT 34.1*  --  33.5*  --   --   --   --  34.1*  --   PLT 35*  --  32*  --   --   --   --  44*  --   APTT  --    < > 37*   < > 50*   < > 48* 56* 63*  LABPROT  --   --   --   --  22.6*  --   --   --   --   INR  --   --   --   --  2.1*  --   --   --   --   HEPARINUNFRC 0.69  --   --   --   --   --   --   --   --   CREATININE 0.40*  --  0.43*  --   --   --   --  0.54  --    < > = values in this interval not displayed.   Estimated Creatinine Clearance: 98.8 mL/min (by C-G formula based on SCr of 0.54 mg/dL).  Medical History: Past Medical History:  Diagnosis Date  . Hepatitis   . Hypertension   . Sciatica    Assessment: Patient is a 54 y/o F with medical history as above who is admitted with severe COVID-19 pneumonia with ARDS requiring intubation and mechanical ventilation. Now with concern for pulmonary embolus. Pharmacy has been consulted to initiate heparin infusion for high clinical suspicion for pulmonary embolus. Patient with confirmed DVT.  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 Plan is now to transition back to heparin drip per negative HIT antibody.    Goal of Therapy:  Heparin level 0.3 - 0.7 Monitor platelets by anticoagulation protocol: Yes   Plan:  Per discussion on rounds, transition back to heparin given negative HIT antibody. Will discontinue argatroban and start heparin drip at previously therapeutic rate of 1750 units/hr approximately one hour after stop of  argatroban drip. Will plan to follow HL and CBC with morning labs.  12/13, PharmD 01/17/2020,12:25 PM

## 2020-01-18 DIAGNOSIS — Z515 Encounter for palliative care: Secondary | ICD-10-CM | POA: Diagnosis not present

## 2020-01-18 DIAGNOSIS — Z7189 Other specified counseling: Secondary | ICD-10-CM

## 2020-01-18 DIAGNOSIS — J9600 Acute respiratory failure, unspecified whether with hypoxia or hypercapnia: Secondary | ICD-10-CM | POA: Diagnosis not present

## 2020-01-18 DIAGNOSIS — U071 COVID-19: Secondary | ICD-10-CM | POA: Diagnosis not present

## 2020-01-18 LAB — CBC WITH DIFFERENTIAL/PLATELET
Abs Immature Granulocytes: 1.46 10*3/uL — ABNORMAL HIGH (ref 0.00–0.07)
Basophils Absolute: 0.1 10*3/uL (ref 0.0–0.1)
Basophils Relative: 0 %
Eosinophils Absolute: 0 10*3/uL (ref 0.0–0.5)
Eosinophils Relative: 0 %
HCT: 29.4 % — ABNORMAL LOW (ref 36.0–46.0)
Hemoglobin: 8.7 g/dL — ABNORMAL LOW (ref 12.0–15.0)
Immature Granulocytes: 4 %
Lymphocytes Relative: 9 %
Lymphs Abs: 3.1 10*3/uL (ref 0.7–4.0)
MCH: 29.9 pg (ref 26.0–34.0)
MCHC: 29.6 g/dL — ABNORMAL LOW (ref 30.0–36.0)
MCV: 101 fL — ABNORMAL HIGH (ref 80.0–100.0)
Monocytes Absolute: 2.1 10*3/uL — ABNORMAL HIGH (ref 0.1–1.0)
Monocytes Relative: 6 %
Neutro Abs: 29.1 10*3/uL — ABNORMAL HIGH (ref 1.7–7.7)
Neutrophils Relative %: 81 %
Platelets: 120 10*3/uL — ABNORMAL LOW (ref 150–400)
RBC: 2.91 MIL/uL — ABNORMAL LOW (ref 3.87–5.11)
RDW: 15.9 % — ABNORMAL HIGH (ref 11.5–15.5)
WBC: 35.9 10*3/uL — ABNORMAL HIGH (ref 4.0–10.5)
nRBC: 4.4 % — ABNORMAL HIGH (ref 0.0–0.2)

## 2020-01-18 LAB — HEPARIN LEVEL (UNFRACTIONATED)
Heparin Unfractionated: 0.72 IU/mL — ABNORMAL HIGH (ref 0.30–0.70)
Heparin Unfractionated: 0.82 IU/mL — ABNORMAL HIGH (ref 0.30–0.70)

## 2020-01-18 LAB — COMPREHENSIVE METABOLIC PANEL
ALT: 55 U/L — ABNORMAL HIGH (ref 0–44)
AST: 56 U/L — ABNORMAL HIGH (ref 15–41)
Albumin: 2.4 g/dL — ABNORMAL LOW (ref 3.5–5.0)
Alkaline Phosphatase: 42 U/L (ref 38–126)
Anion gap: 11 (ref 5–15)
BUN: 113 mg/dL — ABNORMAL HIGH (ref 6–20)
CO2: 40 mmol/L — ABNORMAL HIGH (ref 22–32)
Calcium: 8.1 mg/dL — ABNORMAL LOW (ref 8.9–10.3)
Chloride: 93 mmol/L — ABNORMAL LOW (ref 98–111)
Creatinine, Ser: 0.92 mg/dL (ref 0.44–1.00)
GFR, Estimated: >60 mL/min (ref >60)
Glucose, Bld: 319 mg/dL — ABNORMAL HIGH (ref 70–99)
Potassium: 5.7 mmol/L — ABNORMAL HIGH (ref 3.5–5.1)
Sodium: 144 mmol/L (ref 135–145)
Total Bilirubin: 0.8 mg/dL (ref 0.3–1.2)
Total Protein: 5.5 g/dL — ABNORMAL LOW (ref 6.5–8.1)

## 2020-01-18 LAB — HEMOGLOBIN AND HEMATOCRIT, BLOOD
HCT: 28.5 % — ABNORMAL LOW (ref 36.0–46.0)
HCT: 22.9 % — ABNORMAL LOW (ref 36.0–46.0)
Hemoglobin: 8.5 g/dL — ABNORMAL LOW (ref 12.0–15.0)
Hemoglobin: 6.6 g/dL — ABNORMAL LOW (ref 12.0–15.0)

## 2020-01-18 LAB — GLUCOSE, CAPILLARY
Glucose-Capillary: 297 mg/dL — ABNORMAL HIGH (ref 70–99)
Glucose-Capillary: 247 mg/dL — ABNORMAL HIGH (ref 70–99)
Glucose-Capillary: 263 mg/dL — ABNORMAL HIGH (ref 70–99)
Glucose-Capillary: 197 mg/dL — ABNORMAL HIGH (ref 70–99)
Glucose-Capillary: 196 mg/dL — ABNORMAL HIGH (ref 70–99)
Glucose-Capillary: 292 mg/dL — ABNORMAL HIGH (ref 70–99)
Glucose-Capillary: 388 mg/dL — ABNORMAL HIGH (ref 70–99)

## 2020-01-18 LAB — POTASSIUM
Potassium: 6.4 mmol/L (ref 3.5–5.1)
Potassium: 4.4 mmol/L (ref 3.5–5.1)

## 2020-01-18 LAB — PATHOLOGIST SMEAR REVIEW

## 2020-01-18 LAB — PHOSPHORUS: Phosphorus: 4.5 mg/dL (ref 2.5–4.6)

## 2020-01-18 LAB — TYPE AND SCREEN
ABO/RH(D): A POS
Antibody Screen: NEGATIVE

## 2020-01-18 LAB — MAGNESIUM: Magnesium: 2.8 mg/dL — ABNORMAL HIGH (ref 1.7–2.4)

## 2020-01-18 MED ORDER — CALCIUM GLUCONATE-NACL 1-0.675 GM/50ML-% IV SOLN
1.0000 g | Freq: Once | INTRAVENOUS | Status: AC
  Administered 2020-01-18: 1000 mg via INTRAVENOUS
  Filled 2020-01-18 (×2): qty 50

## 2020-01-18 MED ORDER — INSULIN ASPART 100 UNIT/ML IV SOLN
10.0000 [IU] | Freq: Once | INTRAVENOUS | Status: AC
  Administered 2020-01-18: 10 [IU] via INTRAVENOUS
  Filled 2020-01-18: qty 0.1

## 2020-01-18 MED ORDER — NOREPINEPHRINE 4 MG/250ML-% IV SOLN
0.0000 ug/min | INTRAVENOUS | Status: DC
  Administered 2020-01-18: 25 ug/min via INTRAVENOUS
  Administered 2020-01-18 (×2): 40 ug/min via INTRAVENOUS
  Administered 2020-01-18: 25 ug/min via INTRAVENOUS
  Administered 2020-01-18: 30 ug/min via INTRAVENOUS
  Administered 2020-01-19 (×2): 40 ug/min via INTRAVENOUS
  Administered 2020-01-19: 35 ug/min via INTRAVENOUS
  Administered 2020-01-19: 40 ug/min via INTRAVENOUS
  Filled 2020-01-18 (×11): qty 250

## 2020-01-18 MED ORDER — FUROSEMIDE 10 MG/ML IJ SOLN
40.0000 mg | Freq: Every day | INTRAMUSCULAR | Status: DC
  Administered 2020-01-19: 40 mg via INTRAVENOUS
  Filled 2020-01-18: qty 4

## 2020-01-18 MED ORDER — DEXTROSE 50 % IV SOLN
1.0000 | Freq: Once | INTRAVENOUS | Status: AC
  Administered 2020-01-18: 50 mL via INTRAVENOUS
  Filled 2020-01-18: qty 50

## 2020-01-18 NOTE — Progress Notes (Addendum)
CH received referral from RN via secure chat and verbal referral from [facility]; pt.'s husband Ed requesting CH presence at bedside.  Pt. intubated and receiving extensive medical interventions to keep her stable, per RN; husband grateful to see CH --> asked for input re: cremation vs. burial for pt.  Catholic priest told husband last week that either practice is acceptable for Catholics; CH inquired re: what arrangements might be most meaningful and helpful to husband as he anticipates memorializing pt.  Husband would like there to be a place where he might come to pay respects periodically --> is not interested in scattering of ashes or urn.  He is considering pre-arranged plot in [place] in [place].    Husband continues to grapple with the decision before him re: ending life-prolonging treatment for pt.  Husband verbalized what medical team has shared re: pt.'s condition and prognosis: her organs appear to be shutting down, including brain, pt. would be a 'vegetable' if she somehow survived.  During visit, pt.'s BP began to drop --> CCM called --> decision made to support w/BP meds to stabilize her because, husband shared, today is his sister's birthday and he does not want pt. to die today.  Husband concerned for how 18yo son who is very close to pt. will take her eventual death; he dreads having to break the news to son when pt. passes.  Husband requests to be alone w/pt. (not to have 'a crowd of strangers' in the room) when/if life support is continued tomorrow or later this week.  Husband also expressed tentative desire to have CH or priest present at this time --> CH will check in at this time to see what support may be needed or desired.  CH remains available as needed and plans to follow-up tomorrow late AM.

## 2020-01-18 NOTE — Progress Notes (Signed)
NAME:  ONEIKA SIMONIAN, MRN:  [id number], DOB:  [birth year], LOS: 23 ADMISSION DATE:  12/15/2019, CONSULTATION DATE:  12/07/2019 REFERRING MD: Dr. Lenard Lance, CHIEF COMPLAINT:  Shortness of breath   Brief History   54 yo female admitted with acute hypoxic respiratory failure due to COVID-19 infection requiring mechanical intubation and ventilation.  Past Medical History  Hepatitis HTN Sciatica  Significant Hospital Events   10/24 severe hypoxia. COVID Positivie 10/25 intubation and MV support and severe hypoxia, ETT tube exchanged 10/26-10/31 severe hypoxia and severe ARDS 10/30 - 2h of nimbex and  10/31 early hours - 24h of vec 11/1 high risk for cardiac arrest and death. MSSA on Trch aspiiate 01-09-23 severe ARDS, s/p proning. R calf DVT -started on heparin gtt 11/4 severe hypoxia-did NOT tolerate proning. ECHO 0 normal LV and RV 11/5 - 75% fio32, high peep. Not on pressor.SUpine ventilation. Prn vec. Making urine. On dilaudid gtt and versed gtt. Per Pharmacy got 2h of nimbex and then 24h of continuous on 10/30 - 10/31. +8L since admit. Heparn gtt contiues  01/08/2020-desaturatiion overnight with worsening PF ratio. REdness in neck after niimbex. Now Prone since 0.30 today . On vec gtt with sedation gtt. Fio2 100% , peep 16 -> pulse ox 88%.. Not on perssors. Making urine. Diuresing well. Down t to +4L volume overload since admit.LITD CODe 11/8 remains very sick, severe ARDS, FULL CODE, husband updated 11/10- patient had not tolerated proning. She is making urine. CXR with pulm edema, will diurese and monitor PICC CVP trend only.  11/11-11/12continue current care, patient unchanged over 24h with severe ARDS. Poor prognosis maximal support on ventilatory with hypoxemia <70%. Husband was updated and asked to come in person as patient high risk of cardiac/respiratory arrest. 11/13- patient off paralytics, she is on pressure control without dyssynchrony and lung mechanics. Plan to  continue fully aggressive measures as per husband. Lasix increased today due to improved BP.  11/14-patient continues to deteriorate despite maximally aggressive care. Husband at bedside COVID precautions down due to >21d period. Repeat tracheal aspirate, started empiric abx due to fever. RT for vent changes - despite multiple attempts saturations are at times <70% 11/15 severe hypoxia, high risk for death, GOC discussion with family- code status changed to DNR  Consults:  Palliative TOC  Procedures:  12/27/19 ETT >> 10/26 (exchanged) 12/28/19 ETT 12/27/19 PICC >> 01/13/20 (exchanged after accidental withdrawal) 01/13/20 PICC >>  Significant Diagnostic Tests:  01/09/2020 [place] BLE >> Thrombosis of 1 of the right calf peroneal veins   Micro Data:  Covid PCR 12/28/2019 =Positiver [flu PCR negative] Blood culture 12/08/2019-negative Urine culture 12/27/2019-less than 10,000 colonies insignificant growth Blood culture 01/02/2020-negative Respiratory culture tracheal aspirate 01/03/2020 -rare MSSA MRSA PCR 01/04/2020-negative  Antimicrobials:  Remdesivir 12/28/2019-12/10/2019 Cefepime 12/27/2019-12/27/2019 Cefepime 01/02/2020-01/06/2020 Cefazolin 01/06/2020-x 7 days  Interim history/subjective:  This morning at shift change the care RN noted what is described as appearing to be "fresh blood" from around the patient's flexi-seal rectal tube.   Heparin discontinued, labs ordered including: K+, H&H, type and screen. Patient remains hypoxic with SpO2 readings 40's-70% , intubated & sedated.  Labs/ Imaging personally reviewed Net: -139 mL (since admit - 3.9 L) Na+/ K+: 144/ 5.7 BUN/Cr.: 113/ 0.92 Hgb: 10.0 ~ 8.5  WBC/ TMAX: 25.1/ 103.8  CXR 01/16/20: Tubes & lines with correct placement, persistent interstitial thickening and widespread airspace opacity  Objective   Blood pressure (!) 103/59, pulse (!) 140, temperature (!) 102.9 F (39.4 C), resp. rate (!) 28, height  5\' 6"  (1.676  m), weight 110.4 kg, last menstrual period 08/16/2014, SpO2 (!) 70 %. CVP:  [3 mmHg-25 mmHg] 7 mmHg  Vent Mode: PRVC FiO2 (%):  [100 %] 100 % Set Rate:  [26 bmp-326 bmp] 326 bmp Vt Set:  [450 mL-460 mL] 450 mL PEEP:  [16 cmH20] 16 cmH20 Plateau Pressure:  [22 cmH20-35 cmH20] 28 cmH20   Intake/Output Summary (Last 24 hours) at 01/18/2020 0757 Last data filed at 01/18/2020 0200 Gross per 24 hour  Intake 1470.99 ml  Output 1610 ml  Net -139.01 ml   Filed Weights   01/16/20 0351 01/17/20 0500 01/18/20 0500  Weight: 112.3 kg 110.4 kg 110.4 kg    Examination: General: Adult female, critically ill, lying in bed intubated & sedated requiring mechanical ventilation  HEENT: MM pink/moist, anicteric, edema present, atraumatic, neck supple Neuro: sedated, RASS -4, unable to follow commands, PERRL +3 CV: s1s2 RRR, ST on monitor, no r/m/g Pulm: severe hypoxia on PRVC 100%/ PEEP 16, breath sounds diminished throughout GI: soft, rounded, bs x 4 GU: foley in place with clear yellow urine Skin: scattered bruising, known abrasion on thigh, no rashes/lesions noted Extremities: warm/dry, pulses + 2 R/P, no edema noted  Resolved Hospital Problem list     Assessment & Plan:  Acute Hypoxic Respiratory Failure secondary to COVID-19 Bacterial Pneumonia  PMHx: remote smoker - Ventilator settings: PRVC 6 mL/kg, FiO2: 100%, PEEP 16, continue lung protective strategies as tolerated - Wean PEEP & FiO2 as tolerated to maintain O2 sats >88% - Goal plateau pressure < 30, driving pressure < 15 - unable to tolerate proning- attempted several times - Deep sedation per PAD protocol, goal RASS -4: Dilaudid & Midazolam drips, vecuronium utilized as needed for ventilator asynchrony - Continue azithromycin & ceftriaxone  - continue methylprednisolone 20 mg BID, taper as tolerated - diurese as tolerated - continue Vitamin C & zinc - monitor WBC/ fever curve - Intermittent CXR & ABG PRN  Acute GIB due to  Heparin Drip   Right calf Deep Vein Thrombosis with suspected Pulmonary Embolis Will need to stop heparin  Shock hypovolemia/cardiogenic -use vasopressors to keep MAP>65  HYPERKALEMIA Therapy per protocol   ELECTROLYTES -follow labs as needed -replace as needed -pharmacy consultation and following   CASE DISCUSSED IN MULTIDISCIPLINARY ROUNDS WITH ICU TEAM    Overall, patient is in dying process with progressive multiorgan failure Patient to be started on Pressors     Best practice:  Diet: NPO with TF Pain/Anxiety/Delirium protocol (if indicated): dilaudid & versed VAP protocol (if indicated): established DVT prophylaxis: active bleeding, heparin gtt discontinued GI prophylaxis: protonix BID Glucose control: Q 4 monitoring with SSI Mobility: bedrest Code Status: DNR Family Communication: will update husband today at bedside- 11/16 Disposition: ICU  Labs   CBC: Recent Labs  Lab 01/13/20 0534 01/14/20 0344 01/15/20 0407 01/16/20 0310 01/17/20 0238  WBC 22.5* 32.8* 23.7* 23.3* 25.1*  NEUTROABS 18.8* 28.6* 20.2* 20.3* 22.0*  HGB 10.5* 11.7* 10.6* 10.1* 10.0*  HCT 33.8* 37.8 34.1* 33.5* 34.1*  MCV 94.9 95.2 96.1 96.3 100.9*  PLT 129* 80* 35* 32* 44*    Basic Metabolic Panel: Recent Labs  Lab 01/13/20 0534 01/13/20 0534 01/14/20 0344 01/14/20 0344 01/15/20 0407 01/16/20 0310 01/17/20 0238 01/17/20 0905 01/18/20 0447  NA 138   < > 141  --  141 139 144  --  144  K 4.7   < > 4.5   < > 4.6 4.9 5.3* 5.0 5.7*  CL 93*   < >  89*  --  90* 86* 89*  --  93*  CO2 36*   < > 40*  --  39* 38* 39*  --  40*  GLUCOSE 200*   < > 166*  --  220* 219* 268*  --  319*  BUN 41*   < > 42*  --  47* 48* 61*  --  113*  CREATININE 0.51   < > 0.53  --  0.40* 0.43* 0.54  --  0.92  CALCIUM 8.5*   < > 8.9  --  8.5* 8.4* 8.3*  --  8.1*  MG 2.3  --  2.5*  --  2.3  --  2.5*  --  2.8*  PHOS 4.1   < > 4.7*  --  3.7 4.1 5.4*  --  4.5   < > = values in this interval not displayed.    GFR: Estimated Creatinine Clearance: 85.9 mL/min (by C-G formula based on SCr of 0.92 mg/dL). Recent Labs  Lab 01/14/20 0344 01/15/20 0407 01/16/20 0310 01/17/20 0238  WBC 32.8* 23.7* 23.3* 25.1*    Liver Function Tests: Recent Labs  Lab 01/14/20 0344 01/15/20 0407 01/16/20 0310 01/17/20 0238 01/18/20 0447  AST 43* 30 39 51* 56*  ALT 52* 41 41 53* 55*  ALKPHOS 51 46 45 45 42  BILITOT 0.8 0.7 0.9 0.8 0.8  PROT 6.9 6.5 6.3* 6.3* 5.5*  ALBUMIN 2.8* 2.5* 2.6* 2.7* 2.4*   No results for input(s): LIPASE, AMYLASE in the last 168 hours. No results for input(s): AMMONIA in the last 168 hours.  ABG    Component Value Date/Time   PHART 7.41 01/12/2020 2000   PCO2ART 66 (HH) 01/12/2020 2000   PO2ART 58 (L) 01/12/2020 2000   HCO3 41.8 (H) 01/12/2020 2000   O2SAT 90.0 01/12/2020 2000     Coagulation Profile: Recent Labs  Lab 01/16/20 1403  INR 2.1*    Cardiac Enzymes: No results for input(s): CKTOTAL, CKMB, CKMBINDEX, TROPONINI in the last 168 hours.  HbA1C: Hgb A1c MFr Bld  Date/Time Value Ref Range Status  12/27/2019 05:37 AM 5.6 4.8 - 5.6 % Final    Comment:    (NOTE)         Prediabetes: 5.7 - 6.4         Diabetes: >6.4         Glycemic control for adults with diabetes: <7.0   05/08/2018 09:33 AM 5.1 4.8 - 5.6 % Final    Comment:             Prediabetes: 5.7 - 6.4          Diabetes: >6.4          Glycemic control for adults with diabetes: <7.0     CBG: Recent Labs  Lab 01/17/20 1606 01/17/20 1913 01/17/20 2345 01/18/20 0329 01/18/20 0719  GLUCAP 299* 255* 196* 297* 247*    Critical care time: 45 minutes     Betsey Holiday, AGACNP-BC Acute Care Nurse Practitioner [facility]   [phone] / [phone] Please see Amion for pager details.    PCCM ATTENDING ATTESTATION:  I have evaluated patient with the APP, I personally  reviewed database in its entirety and discussed care plan in detail. In  addition, this patient was discussed on multidisciplinary rounds.   I agree with assessment and plan.   Critical Care Time devoted to patient care services described in this note is 57 minutes.   Overall, patient is critically  ill, prognosis is guarded.  Patient with Multiorgan failure and at high risk for cardiac arrest and death.    Lucie Leather, M.D.  [facility]  Medical Director [facility] Medical Director [facility] Cardio-Pulmonary Department

## 2020-01-18 NOTE — Progress Notes (Signed)
1330 Blood pressure continues to drift downward. Explained to husband that her body is shutting itself down. Discussed use of vasopressors for B/P. Husband requested to speak with Dr. Belia Heman. Dr. Belia Heman informed of request.

## 2020-01-18 NOTE — Progress Notes (Signed)
Palliative-   HPI: 54 y.o. female  with past medical history of HTN admitted on 12/28/2019 with shortness of breath and cough. Found to be hypoxic in ED.  COVID-19 positive. Required intubation 10/25. Also with bacterial pna. Has R calf DVT. Has not been able to tolerate prone position. PMT consulted for GOC.   Unfortunately, Laylia is continuing to decline. She desaturated last night into the 30's despite max vent support. She has had prolonged hypoxia with oxygen saturation frequently falling below 70%.   Patient's spouse at bedside. Received report from RN that Dr. Belia Heman had informed family that patient is dying despite all current interventions and recommends transition to full comfort measures.   Discussed with spouse his feelings regarding full comfort/extubation. Patient's spouse states that he trusts Dr. Clovis Fredrickson recommendations and will do what dr. Belia Heman recommends, however, he wished to wait until tomorrow morning to discuss it further.   Spouse shared feelings of grief and responsibility in decision making process. Encouraged spouse to consider that he is not making decision himself, rather he is speaking for patient and what she would tell us if patient could see and understand all available options. Mr. Boehme was able to verbalize that Donn would not wish to continue to live in this current state, or in a prolonged debilitated state without possibility of recovering functional status.   Palliative medicine provider will followup with family tomorrow.   Ocie Bob, AGNP-C Palliative Medicine  Please call Palliative Medicine team phone with any questions [phone]. For individual providers please see AMION.  Total time: 38 mins  .

## 2020-01-18 NOTE — Progress Notes (Signed)
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 Patient is currently intubated, sedated, and on mechanical ventilation.   Nutrition: Tube feeds + free water  Diuretics: IV Lasix   Goal of Therapy:  Electrolytes WNL  Plan:   Patient has been hyperkalemic on diuresis. Renal function is normal. Treatment this morning for K 6.4.  Continue to follow along.  Pricilla Riffle, PharmD 01/18/2020 2:08 PM

## 2020-01-18 NOTE — Progress Notes (Signed)
°  Chaplain On-Call responded to page from Nurse Wille Celeste, who reported the patient's husband made a specific request for a visit from [employer].  This Chaplain told Wille Celeste that [employer] will be available after 1230 today, and that the referral will be made to him.  Chaplain Mayreli Alden B. Jazzlene Huot M.Div., [facility]

## 2020-01-18 NOTE — Progress Notes (Signed)
GOALS OF CARE DISCUSSION  The Clinical status was relayed to family in detail-husband at bedside  Updated and notified of patients medical condition.  Patient remains unresponsive and will not open eyes to command.     patient with increased WOB and using accessory muscles to breathe Explained to family course of therapy and the modalities     Patient with Progressive multiorgan failure with very low chance of meaningful recovery despite all aggressive and optimal medical therapy. Patient is in the Dying  Process associated with Suffering.  Family understands the situation.  They have consented and agreed to DNR/DNI and would like to proceed with Comfort care measures in next 24 hrs  Family are satisfied with Plan of action and management. All questions answered  Additional CC time 32 mins   Jacalyn Biggs Santiago Glad, M.D.  [facility]  Medical Director [facility] Medical Director [facility] Cardio-Pulmonary Department

## 2020-01-18 NOTE — Progress Notes (Signed)
Nutrition Follow-up  DOCUMENTATION CODES:   Morbid obesity  INTERVENTION:  Tube feeds now reduce to half rate of Vital 1.5 Cal at 35 mL/hr. Per chart plan for possible transition to comfort care in the next 24 hours. Will continue to monitor goals of care.  Goal TF regimen: -Vital 1.5 Cal at 70 mL/hr (1680 mL goal daily volume) + PROSource TF 45 mL TID per tube -Provides 2640 kcal, 146 grams of protein, 1277 mL H2O daily  NUTRITION DIAGNOSIS:   Inadequate oral intake related to inability to eat as evidenced by NPO status.  Ongoing.  GOAL:   Patient will meet greater than or equal to 90% of their needs  Not met at this time.  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  10/25 intubated 10/26 ETT replaced due to blown cuff  Patient is currently intubated on ventilator support MV: 9 L/min Temp (24hrs), Avg:100.8 F (38.2 C), Min:98.2 F (36.8 C), Max:103.8 F (39.9 C)  Medications reviewed and include: azithromycin, free water 30 mL Q4hrs, Lasix 40 mg daily IV, Novolog 0-20 units Q4hrs, Solu-Medrol 20 mg BID IV, MVI daily per tube, Protonix, human albumin 12.5 grams daily IV, ceftriaxone, Dilaudid gtt, Versed gtt, norepinephrine gtt at 30 mcg/min started this afternoon.  Labs reviewed: CBG 197-263, Potassium 6.4, Chloride 93, CO2 40, BUN 113, Magnesium 2.8.  I/O: 1670 mL UOP yesterday (0.6 mL/kg/hr)  Weight trend: 110.4 kg on 11/16; -9.4 kg from 10/26  Enteral Access: 18 Fr. OGT placed 10/25; extends into stomach per chest x-ray 11/7  TF regimen: Vital 1.5 Cal now reduced to 35 mL/hr + PROSource 45 mL TID per tube  Discussed with RN and on rounds. Patient had rectal bleeding this AM so tube feeds were reduced to half rate by NP.  Patient now requiring norepinephrine gtt. Per review of chart in afternoon plan is for likely transition to comfort care in the next 24 hours.  Diet Order:   Diet Order            Diet NPO time specified  Diet effective now                EDUCATION NEEDS:   No education needs have been identified at this time  Skin:  Skin Assessment: Reviewed RN Assessment  Last BM:  01/18/2020 small type 7  Height:   Ht Readings from Last 1 Encounters:  01/12/20 5' 6"  (1.676 m)   Weight:   Wt Readings from Last 1 Encounters:  01/18/20 110.4 kg   Ideal Body Weight:  59.1 kg  BMI:  Body mass index is 39.28 kg/m.  Estimated Nutritional Needs:   Kcal:  2679  Protein:  140-150 grams  Fluid:  >/= 2 L/day  Jacklynn Barnacle, MS, RD, LDN Pager number available on Amion

## 2020-01-18 NOTE — Progress Notes (Addendum)
Inpatient Diabetes Program Recommendations  AACE/ADA: New Consensus Statement on Inpatient Glycemic Control (2015)  Target Ranges:  Prepandial:   less than 140 mg/dL      Peak postprandial:   less than 180 mg/dL (1-2 hours)      Critically ill patients:  140 - 180 mg/dL   Lab Results  Component Value Date   GLUCAP 247 (H) 01/18/2020   HGBA1C 5.6 12/27/2019    Review of Glycemic Control Results for Carrie Davies, Carrie Davies (MRN [id number]) as of 01/18/2020 10:08  Ref. Range 01/17/2020 16:06 01/17/2020 19:13 01/17/2020 23:45 01/18/2020 03:29 01/18/2020 07:19  Glucose-Capillary Latest Ref Range: 70 - 99 mg/dL 858 (H) 850 (H) 277 (H) 297 (H) 247 (H)   Diabetes history: None Current orders for Inpatient glycemic control:  Novolog resistant q 4 hours Solumedrol 20 mg IV q 12 hours Inpatient Diabetes Program Recommendations:   Consider adding Novolog 3 units q 4 hours for tube feed coverage.  Thanks  Beryl Meager, RN, BC-ADM Inpatient Diabetes Coordinator Pager [phone] (8a-5p)

## 2020-01-18 NOTE — Progress Notes (Signed)
Neuro: unresponsive, unable to follow commands, remains on dilaudid and versed infusions  Resp: stable with vent settings, tolerating suctioning, no significant desaturations  CV: TMAX 103.82, cooling blanket placed under patient, it has remained in place for thermoregulation.  This afternoon she began experiencing hypotension-levophed started and titrated-See MAR She remains tachycardic in the 120s-140s  GIGU: foley in place, fecal management system in place, continuous tube feeds going at half- she is tolerating all of these well.  This AM it was discovered she had profuse rectal bleeding. Heparin was turned off in response.   Skin: Clean, dry, and intact- generalized bruising and a small healing abrasion to her upper right thigh  Social: Husband at the bedside throughout the day.  He expresses great hesitation to make any decisions regarding the patient's care plan. He is fearful of the guilt he would carry by withdrawing care from his wife.   Dr. Belia Heman and NP [place] had a meeting with the husband to discuss end of life care plan.  Chaplain Thayer Ohm was requested to visit, he spent some time with the husband this afternoon, will contact the church used previously to have a priest visit the patient again, hopefully tomorrow.

## 2020-01-18 NOTE — Progress Notes (Signed)
Dr. Belia Heman  And Mr. Carrie Davies decided to wait until tomorrow for comfort care.

## 2020-01-18 NOTE — Progress Notes (Signed)
ANTICOAGULATION CONSULT NOTE  Pharmacy Consult for heparin Indication: pulmonary embolus  Patient Measurements: Height: 5\' 6"  (167.6 cm) Weight: 110.4 kg (243 lb 6.2 oz) IBW/kg (Calculated) : 59.3 Heparin Dosing Weight: 89.15 kg   Labs: Recent Labs    01/16/20 0310 01/16/20 0855 01/16/20 1403 01/16/20 1403 01/16/20 2009 01/17/20 0238 01/17/20 0652 01/18/20 0447  HGB 10.1*  --   --   --   --  10.0*  --   --   HCT 33.5*  --   --   --   --  34.1*  --   --   PLT 32*  --   --   --   --  44*  --   --   APTT 37*   < > 50*   < > 48* 56* 63*  --   LABPROT  --   --  22.6*  --   --   --   --   --   INR  --   --  2.1*  --   --   --   --   --   HEPARINUNFRC  --   --   --   --   --   --   --  0.72*  CREATININE 0.43*  --   --   --   --  0.54  --   --    < > = values in this interval not displayed.   Estimated Creatinine Clearance: 98.8 mL/min (by C-G formula based on SCr of 0.54 mg/dL).  Medical History: Past Medical History:  Diagnosis Date  . Hepatitis   . Hypertension   . Sciatica    Assessment: Patient is a 54 y/o F with medical history as above who is admitted with severe COVID-19 pneumonia with ARDS requiring intubation and mechanical ventilation. Now with concern for pulmonary embolus. Pharmacy has been consulted to initiate heparin infusion for high clinical suspicion for pulmonary embolus. Patient with confirmed DVT.  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 Plan is now to transition back to heparin drip per negative HIT antibody.    Goal of Therapy:  Heparin level 0.3 - 0.7 Monitor platelets by anticoagulation protocol: Yes   Plan:  Per discussion on rounds, transition back to heparin given negative HIT antibody. Will discontinue argatroban and start heparin drip at previously therapeutic rate of 1750 units/hr approximately one hour after stop of argatroban drip. Will plan to follow HL and CBC with morning  labs.  11/16 @ 0447: HL = 0.72 Will decrease heparin drip rate to 1650 units/hr and recheck HL 6 hrs after rate change.   Deja Kaigler D, PharmD 01/18/2020,6:05 AM

## 2020-01-19 DIAGNOSIS — Z7189 Other specified counseling: Secondary | ICD-10-CM | POA: Diagnosis not present

## 2020-01-19 DIAGNOSIS — J9600 Acute respiratory failure, unspecified whether with hypoxia or hypercapnia: Secondary | ICD-10-CM | POA: Diagnosis not present

## 2020-01-19 DIAGNOSIS — U071 COVID-19: Secondary | ICD-10-CM | POA: Diagnosis not present

## 2020-01-19 DIAGNOSIS — Z515 Encounter for palliative care: Secondary | ICD-10-CM | POA: Diagnosis not present

## 2020-01-19 LAB — CBC WITH DIFFERENTIAL/PLATELET
Abs Immature Granulocytes: 5.93 10*3/uL — ABNORMAL HIGH (ref 0.00–0.07)
Basophils Absolute: 0.1 10*3/uL (ref 0.0–0.1)
Basophils Relative: 0 %
Eosinophils Absolute: 0.1 10*3/uL (ref 0.0–0.5)
Eosinophils Relative: 0 %
HCT: 26.2 % — ABNORMAL LOW (ref 36.0–46.0)
Hemoglobin: 7.7 g/dL — ABNORMAL LOW (ref 12.0–15.0)
Immature Granulocytes: 12 %
Lymphocytes Relative: 7 %
Lymphs Abs: 3.4 10*3/uL (ref 0.7–4.0)
MCH: 29.5 pg (ref 26.0–34.0)
MCHC: 29.4 g/dL — ABNORMAL LOW (ref 30.0–36.0)
MCV: 100.4 fL — ABNORMAL HIGH (ref 80.0–100.0)
Monocytes Absolute: 3.1 10*3/uL — ABNORMAL HIGH (ref 0.1–1.0)
Monocytes Relative: 6 %
Neutro Abs: 38.3 10*3/uL — ABNORMAL HIGH (ref 1.7–7.7)
Neutrophils Relative %: 75 %
Platelets: 154 10*3/uL (ref 150–400)
RBC: 2.61 MIL/uL — ABNORMAL LOW (ref 3.87–5.11)
RDW: 16.2 % — ABNORMAL HIGH (ref 11.5–15.5)
WBC: 50.9 10*3/uL (ref 4.0–10.5)
nRBC: 7.5 % — ABNORMAL HIGH (ref 0.0–0.2)

## 2020-01-19 LAB — COMPREHENSIVE METABOLIC PANEL
ALT: 799 U/L — ABNORMAL HIGH (ref 0–44)
AST: 795 U/L — ABNORMAL HIGH (ref 15–41)
Albumin: 2.6 g/dL — ABNORMAL LOW (ref 3.5–5.0)
Alkaline Phosphatase: 66 U/L (ref 38–126)
Anion gap: 19 — ABNORMAL HIGH (ref 5–15)
BUN: 191 mg/dL — ABNORMAL HIGH (ref 6–20)
CO2: 28 mmol/L (ref 22–32)
Calcium: 7.2 mg/dL — ABNORMAL LOW (ref 8.9–10.3)
Chloride: 89 mmol/L — ABNORMAL LOW (ref 98–111)
Creatinine, Ser: 2.12 mg/dL — ABNORMAL HIGH (ref 0.44–1.00)
GFR, Estimated: 27 mL/min — ABNORMAL LOW (ref >60)
Glucose, Bld: 224 mg/dL — ABNORMAL HIGH (ref 70–99)
Potassium: 6.9 mmol/L (ref 3.5–5.1)
Sodium: 136 mmol/L (ref 135–145)
Total Bilirubin: 1.3 mg/dL — ABNORMAL HIGH (ref 0.3–1.2)
Total Protein: 5.1 g/dL — ABNORMAL LOW (ref 6.5–8.1)

## 2020-01-19 LAB — GLUCOSE, CAPILLARY
Glucose-Capillary: 396 mg/dL — ABNORMAL HIGH (ref 70–99)
Glucose-Capillary: 374 mg/dL — ABNORMAL HIGH (ref 70–99)
Glucose-Capillary: 401 mg/dL — ABNORMAL HIGH (ref 70–99)
Glucose-Capillary: 330 mg/dL — ABNORMAL HIGH (ref 70–99)
Glucose-Capillary: 196 mg/dL — ABNORMAL HIGH (ref 70–99)

## 2020-01-19 LAB — CULTURE, RESPIRATORY W GRAM STAIN

## 2020-01-19 LAB — PHOSPHORUS: Phosphorus: 11.7 mg/dL — ABNORMAL HIGH (ref 2.5–4.6)

## 2020-01-19 LAB — MAGNESIUM: Magnesium: 3.5 mg/dL — ABNORMAL HIGH (ref 1.7–2.4)

## 2020-01-19 MED ORDER — NOREPINEPHRINE 16 MG/250ML-% IV SOLN
0.0000 ug/min | INTRAVENOUS | Status: DC
  Administered 2020-01-19: 40 ug/min via INTRAVENOUS
  Filled 2020-01-19 (×2): qty 250

## 2020-02-02 NOTE — Progress Notes (Signed)
Goals of Care discussion   The patient's husband, Carrie Davies, requested to discuss options to make sure his wife will not suffer, while not continuing futile interventions. He is very concerned that terminally withdrawing would cause the patient to choke and he does not want to put her through that process.  We discussed in detail what terminal extubation would entail and how we as the healthcare providers would work to keep her comfortable. He then asked about removing the levophed drip. We discussed that process and how not artificially maintaining her blood pressure would aid the dying process without additional suffering. This second option seemed to be the least traumatic for Mr. Cortner while also allowing the patient to continue the dying process with dignity. Will discontinue levophed drip while maintaining other present care and interventions at this time per the husband's decision.   Carrie Davies, AGACNP-BC Acute Care Nurse Practitioner [facility]   [phone] / [phone] Please see Amion for pager details.

## 2020-02-02 NOTE — Progress Notes (Signed)
   2020/01/29 1730  Clinical Encounter Type  Visited With Family  Visit Type Spiritual support;Death  Referral From Nurse  Consult/Referral To Chaplain  Chaplain responded to pg, saying Pt was dying. When chaplain arrived Pt was deceased and husband was t bedside crying with one sister on the telephone. Pt's husband had several cell phones with him and eventually had sisters on all three phones. They talked with him trying to make sure he was alright and he repeatedly said, "No I am not alright." While Pt's husband was on the telephone with his sister, one of them led the family in prayer and they talked for over one hour. While Pt's husband was on the telephone, chaplain left. Pt's husband was concern about how he would tell his son, who has Asperger. Pt's husband was also concerned about how he would walk out the door and never see her Pt again. They have been together for 37 yrs.  12:06 AM 01/20/2020 Chaplain received a page and when she called ICU the secretary said Pt's husband was still here. Chaplain went to the room and talked with Pt's husband. He said his wife was strong in her faith and that he has to get back to God. He told chaplain that he believes in God, but fell away. Chaplain encouraged him to get back to God (however he saw that). Pt's husband lend over and kissed his wife, asking her to watch over Enid Derry (their son). With tears in his eyes, Pt's husband slowly walked out of the room. Chaplain walked with him to the elevator and down to the front door. As he left, Pt's husband told chaplain that he would be alright and she reminded him to call his sister to let her know he was alright and on his way home.

## 2020-02-02 NOTE — Death Summary Note (Signed)
DEATH SUMMARY   Patient Details  Name: Carrie Davies MRN: [id number] DOB: May 16, 1965  AdmisISADORA DELOREYge Information   Admit Date:  06-Jan-2020  Date of Death:  01/30/2020  Time of Death:  1921  Length of Stay: 08-06-2022  Referring Physician: Jerl Mina, MD   Reason(s) for Hospitalization  Shortness of Breath   Diagnoses  Preliminary cause of death: COVID-19 Secondary Diagnoses (including complications and co-morbidities):  Active Problems:   Acute respiratory failure due to COVID-19 [facility])   Goals of care, counseling/discussion   Palliative care by specialist   Advanced care planning/counseling discussion   Acute Diastolic Heart Failure    Morbid Obesity   Acute renal failure with hyperkalemia    Pneumonia    Acute gastrointestinal bleeding    Thrombocytopenia    Right lower extremity deep vein thrombosis    Possible pulmonary embolism   Acute toxic encephalopathy   Hepatitis    Hyperglycemia   Brief Hospital Course (including significant findings, care, treatment, and services provided and events leading to death)  Carrie Davies is a 54 y.o. year old female past medical history of HTN admitted on 01-06-2020 with shortness of breath and cough. Found to be hypoxic in ED and COVID-19 positive, placed on heated high flow and NRB mask requiring admission to ICU. Due to continued decline in respiratory status pt required mechanical intubation on 12/27/2019. Hospital course complicated by right calf DVT, severe hypoxia in the setting of ARDS along with bacterial pneumonia,  inability to wean mechanical ventilation, and pt unable to tolerate proning.  Pts code status changed to DNR by pts husband on 01/17/2020.  Pt later developed an acute GI bleed on 01/18/2020. On January 30, 2020 pt developed multiorgan failure, and continued to require maximal ventilator support but remained hypoxic.  Following discussions with PCCM team pts husband decided to stop vasopressors and all labs draws on  01-30-20.  The pt expired on 30-Jan-2020 at 1921.  Pertinent Labs and Studies  Significant Diagnostic Studies US Venous Img Lower Bilateral (DVT)  Result Date: 01/05/2020 CLINICAL DATA:  Bilateral leg pain, edema EXAM: BILATERAL LOWER EXTREMITY VENOUS DOPPLER ULTRASOUND TECHNIQUE: Gray-scale sonography with graded compression, as well as color Doppler and duplex ultrasound were performed to evaluate the lower extremity deep venous systems from the level of the common femoral vein and including the common femoral, femoral, profunda femoral, popliteal and calf veins including the posterior tibial, peroneal and gastrocnemius veins when visible. The superficial great saphenous vein was also interrogated. Spectral Doppler was utilized to evaluate flow at rest and with distal augmentation maneuvers in the common femoral, femoral and popliteal veins. COMPARISON:  None. FINDINGS: RIGHT LOWER EXTREMITY Common Femoral Vein: No evidence of thrombus. Normal compressibility, respiratory phasicity and response to augmentation. Saphenofemoral Junction: No evidence of thrombus. Normal compressibility and flow on color Doppler imaging. Profunda Femoral Vein: No evidence of thrombus. Normal compressibility and flow on color Doppler imaging. Femoral Vein: No evidence of thrombus. Normal compressibility, respiratory phasicity and response to augmentation. Popliteal Vein: No evidence of thrombus. Normal compressibility, respiratory phasicity and response to augmentation. Calf Veins: 1 of the peroneal veins demonstrates occlusive thrombus within the right calf. Superficial Great Saphenous Vein: No evidence of thrombus. Normal compressibility. Venous Reflux:  None. Other Findings:  None. LEFT LOWER EXTREMITY Common Femoral Vein: No evidence of thrombus. Normal compressibility, respiratory phasicity and response to augmentation. Saphenofemoral Junction: No evidence of thrombus. Normal compressibility and flow on color Doppler  imaging. Profunda Femoral Vein: No evidence of thrombus.  Normal compressibility and flow on color Doppler imaging. Femoral Vein: No evidence of thrombus. Normal compressibility, respiratory phasicity and response to augmentation. Popliteal Vein: No evidence of thrombus. Normal compressibility, respiratory phasicity and response to augmentation. Calf Veins: No evidence of thrombus. Normal compressibility and flow on color Doppler imaging. Superficial Great Saphenous Vein: No evidence of thrombus. Normal compressibility. Venous Reflux:  None. Other Findings:  None. IMPRESSION: Thrombosis of 1 of the right calf peroneal veins. Otherwise negative. Electronically Signed   By: Charlett Nose M.D.   On: 01/05/2020 19:43   DG Chest Port 1 View  Result Date: 01/16/2020 CLINICAL DATA:  Hypertension.  COVID-19 positive.  Hypoxia. EXAM: PORTABLE CHEST 1 VIEW COMPARISON:  January 16, 2020 study obtained earlier in the day FINDINGS: Endotracheal tube tip is 3.9 cm above the carina. Central catheter tip is in the superior vena cava. Nasogastric tube tip and side port are below the diaphragm. No pneumothorax. Widespread airspace opacity and interstitial thickening again noted throughout the lungs, similar to earlier in the day. Heart is borderline prominent with pulmonary vascularity normal. No adenopathy. No bone lesions. IMPRESSION: Persistent interstitial thickening and widespread airspace opacity. Question edema versus multifocal pneumonia; both entities may be present concurrently. Tube and catheter positions as described without evident pneumothorax. Essentially stable cardiac silhouette. Electronically Signed   By: Bretta Bang III M.D.   On: 01/16/2020 12:14   DG Chest Port 1 View  Result Date: 01/16/2020 CLINICAL DATA:  Hypoxia EXAM: PORTABLE CHEST 1 VIEW COMPARISON:  January 13, 2020 FINDINGS: Endotracheal tube tip is 3.5 cm above the carina. Nasogastric tube tip and side port are below the diaphragm.  Central catheter tip is in the superior vena cava. No pneumothorax. There is interstitial thickening as well as patchy airspace opacity throughout the lungs bilaterally, similar to recent study. No new opacity evident. Heart is borderline enlarged with pulmonary vascularity normal. No adenopathy. No bone lesions. IMPRESSION: Tube and catheter positions as described without pneumothorax. Interstitial and airspace opacity bilaterally with mild cardiomegaly. Question multifocal pneumonia versus edema with congestive heart failure. Both entities may be present concurrently. Appearance similar to recent prior study. Electronically Signed   By: Bretta Bang III M.D.   On: 01/16/2020 09:46   DG Chest Port 1 View  Result Date: 01/13/2020 CLINICAL DATA:  Hypoxia EXAM: PORTABLE CHEST 1 VIEW COMPARISON:  01/13/2020, 01/12/2020, 01/10/2020 FINDINGS: Endotracheal tube tip is about 2.8 cm superior to carina. Esophageal tube tip below the diaphragm but incompletely visualized. Right upper extremity central venous catheter tip over the SVC. Widespread ground-glass opacities and patchy consolidations without significant change. Stable cardiomediastinal silhouette. No pneumothorax. IMPRESSION: 1. Right upper extremity central venous catheter tip over the SVC. 2. Widespread airspace disease without significant change since radiograph performed earlier today. Electronically Signed   By: Jasmine Pang M.D.   On: 01/13/2020 23:14   DG Chest Port 1 View  Result Date: 01/13/2020 CLINICAL DATA:  COVID EXAM: PORTABLE CHEST 1 VIEW COMPARISON:  01/12/2020, 01/10/2020, 01/09/2020 FINDINGS: Endotracheal tube tip is about 2 cm superior to the carina. Esophageal tube tip is below the diaphragm but incompletely visualized. Right upper extremity central venous catheter tip appears slightly retracted, the tip overlies the distal brachiocephalic region. Continued bilateral ground-glass opacities and patchy consolidations, without  significant change. Stable cardiomediastinal silhouette. No pneumothorax. IMPRESSION: 1. No significant interval change in bilateral ground-glass opacities and patchy consolidations. 2. Right upper extremity central venous catheter tip appears slightly retracted, the tip now overlies the distal  brachiocephalic region. Electronically Signed   By: Jasmine Pang M.D.   On: 01/13/2020 17:20   DG Chest Port 1 View  Result Date: 01/12/2020 CLINICAL DATA:  Respiratory failure. EXAM: PORTABLE CHEST 1 VIEW COMPARISON:  01/10/2020 FINDINGS: The endotracheal tube, NG tube and right PICC line are stable. Persistent diffuse interstitial and airspace process in the lungs. No pleural effusion or pneumothorax. IMPRESSION: 1. Stable support apparatus. 2. Persistent diffuse interstitial and airspace process. Electronically Signed   By: Rudie Meyer M.D.   On: 01/12/2020 06:39   DG Chest Port 1 View  Result Date: 01/10/2020 CLINICAL DATA:  Hypoxia EXAM: PORTABLE CHEST 1 VIEW COMPARISON:  January 09, 2020. FINDINGS: Endotracheal tube tip is 4.1 cm above the carina. Central catheter tip is in the superior vena cava. Nasogastric tube tip and side port are below the diaphragm. No pneumothorax. There is airspace opacity scattered throughout the lungs bilaterally, similar to 1 day prior. Heart is upper normal in size with pulmonary vascularity normal. No adenopathy. No bone lesions. IMPRESSION: Tube and catheter positions as described without pneumothorax. Widespread multifocal airspace opacity consistent with multifocal pneumonia, essentially stable. Stable cardiac silhouette. Electronically Signed   By: Bretta Bang III M.D.   On: 01/10/2020 07:53   DG Chest Port 1 View  Result Date: 01/09/2020 CLINICAL DATA:  Intubation, COVID-19 EXAM: PORTABLE CHEST 1 VIEW COMPARISON:  Portable exam 0528 hours compared to 01/08/2020 FINDINGS: Tip of endotracheal tube projects 4.6 cm above carina. Nasogastric tube extends into stomach.  RIGHT arm PICC line tip projects over SVC. Normal heart size and mediastinal contours. BILATERAL pulmonary infiltrates consistent with multifocal pneumonia, improved in RIGHT upper lobe since previous exam. No pleural effusion or pneumothorax. IMPRESSION: BILATERAL pulmonary infiltrates consistent with multifocal pneumonia and history of COVID-19 , improved in RIGHT upper lobe since prior study. Electronically Signed   By: Ulyses Southward M.D.   On: 01/09/2020 11:35   DG Chest Port 1 View  Result Date: 01/08/2020 CLINICAL DATA:  COVID-19, admitted 12 days ago, on ventilator, hypertension, former smoker, hepatitis EXAM: PORTABLE CHEST 1 VIEW COMPARISON:  Portable exam 0802 hours compared to 01/06/2020 FINDINGS: Tip of endotracheal tube projects 4.7 cm above carina. Nasogastric tube extends into stomach. RIGHT arm PICC line tip projects over SVC. Borderline enlargement of cardiac silhouette. Stable mediastinal contours. Patchy infiltrates bilaterally consistent with multifocal pneumonia and history of COVID-19. No pleural effusion or pneumothorax. Probable component of atelectasis at RIGHT base. IMPRESSION: Diffuse infiltrates of COVID-19 pneumonia, little changed. Slightly increased subsegmental atelectasis at RIGHT base. Electronically Signed   By: Ulyses Southward M.D.   On: 01/08/2020 11:42   DG Chest Port 1 View  Result Date: 01/08/2020 CLINICAL DATA:  COVID-19, admitted 12 days ago, on ventilator, hypertension, former smoker, hepatitis EXAM: PORTABLE CHEST 1 VIEW COMPARISON:  Portable exam 1102 hours compared to 0802 hours FINDINGS: Tip of endotracheal tube projects 5.6 cm above carina. Nasogastric tube extends into stomach. RIGHT arm PICC line, tip projecting over proximal SVC. Upper normal heart size. Patchy airspace infiltrates throughout both lungs consistent with multifocal pneumonia and COVID-19. No pleural effusion or pneumothorax. IMPRESSION: Diffuse pulmonary infiltrates of COVID-19 pneumonia, unchanged.  Electronically Signed   By: Ulyses Southward M.D.   On: 01/08/2020 11:40   DG Chest Port 1 View  Result Date: 01/06/2020 CLINICAL DATA:  Shortness of breath.  COVID EXAM: PORTABLE CHEST 1 VIEW COMPARISON:  01/05/2020. FINDINGS: Endotracheal tube, NG tube, right PICC line in stable position. Heart size normal.  Bibasilar atelectasis. Diffuse bilateral pulmonary infiltrates again noted without interim change. No pleural effusion or pneumothorax. IMPRESSION: 1. Lines and tubes in stable position. 2. Bibasilar atelectasis. Diffuse bilateral pulmonary infiltrates again noted without interim change. Electronically Signed   By: Maisie Fus  Register   On: 01/06/2020 05:37   DG Chest Port 1 View  Result Date: 01/05/2020 CLINICAL DATA:  Shortness of breath.  COVID pneumonia. EXAM: PORTABLE CHEST 1 VIEW COMPARISON:  01/03/2020 FINDINGS: The endotracheal tube tip is stable above the carina. NG tube tip is below the GE junction. Right arm PICC line tip is in the distal SVC. Stable cardiomediastinal contours. No change in bilateral interstitial and airspace densities. IMPRESSION: 1. Stable support apparatus. 2. No change in bilateral interstitial and airspace densities compatible with COVID-19 pneumonia Electronically Signed   By: Signa Kell M.D.   On: 01/05/2020 08:51   DG Chest Port 1 View  Result Date: 01/03/2020 CLINICAL DATA:  Pneumonia due to COVID-19 virus EXAM: PORTABLE CHEST 1 VIEW COMPARISON:  01/02/2020 FINDINGS: Tracheostomy tube tip is above the carina. There is a NG tube with tip below the GE junction. Normal heart size. No significant change in the appearance of bilateral interstitial and airspace densities.The visualized osseous structures are unremarkable. IMPRESSION: Persistent bilateral interstitial and airspace densities compatible with COVID-19 pneumonia. Electronically Signed   By: Signa Kell M.D.   On: 01/03/2020 07:41   DG Chest Port 1 View  Result Date: 01/02/2020 CLINICAL DATA:  Acute  respiratory failure due to COVID-19 infection. Short of breath. Follow-up exam. EXAM: PORTABLE CHEST 1 VIEW COMPARISON:  01/01/2020 and earlier studies. FINDINGS: Hazy bilateral airspace lung opacities are without change. No new lung abnormalities. No convincing pleural effusion and no pneumothorax. Endotracheal tube, nasal/orogastric tube and right PICC are stable. IMPRESSION: 1. No change from the previous day's exam. Stable well-positioned support apparatus. Persistent bilateral airspace lung opacities consistent with multifocal pneumonia. Electronically Signed   By: Amie Portland M.D.   On: 01/02/2020 05:14   DG Chest Port 1 View  Result Date: 01/01/2020 CLINICAL DATA:  Follow-up for COVID-19 infection. EXAM: PORTABLE CHEST 1 VIEW COMPARISON:  12/28/2019 and older studies. FINDINGS: No convincing change in bilateral hazy airspace lung opacities. No new lung abnormalities. No convincing pleural effusion and no pneumothorax. Endotracheal tube, nasal/orogastric tube and right-sided PICC are stable and well positioned. IMPRESSION: 1. No interval change. Persistent hazy airspace lung opacities. Stable well-positioned support apparatus. Electronically Signed   By: Amie Portland M.D.   On: 01/01/2020 06:37   DG Chest Port 1 View  Result Date: 12/28/2019 CLINICAL DATA:  Endotracheal tube present.  COVID positive. EXAM: PORTABLE CHEST 1 VIEW COMPARISON:  Same day chest radiograph FINDINGS: Interval retraction of the endotracheal tube with the tip now projecting approximately 2.6 cm above the carina. Additional support devices are in similar position. Slightly improved aeration lungs with otherwise similar extensive bilateral airspace opacities. No visible pleural effusions or pneumothorax. Similar cardiomediastinal silhouette. IMPRESSION: 1. Interval retraction of the endotracheal tube with the tip now projecting approximately 2.6 cm above the carina. 2. Slightly improved aeration of the lungs with otherwise  similar extensive bilateral airspace opacities, compatible with multifocal pneumonia and reported COVID diagnosis. Electronically Signed   By: Feliberto Harts MD   On: 12/28/2019 15:31   DG CHEST PORT 1 VIEW  Result Date: 12/28/2019 CLINICAL DATA:  Pneumonia due to COVID-19. EXAM: PORTABLE CHEST 1 VIEW COMPARISON:  Radiograph yesterday. FINDINGS: Low positioning of the endotracheal tube 9 mm  from the carina. Recommend retraction of 1-2 cm. Enteric tube in place with tip below the diaphragm in the stomach. Right upper extremity PICC in place, tip at the mid SVC. Lower lung volumes from prior exam. Slight progression in heterogeneous bilateral lung opacities from radiograph yesterday. Upper normal heart size unchanged from prior. No visualized pneumothorax or evidence of pneumomediastinum. No significant pleural effusion. IMPRESSION: 1. Low positioning of the endotracheal tube 9 mm from the carina. Recommend retraction of 1-2 cm. 2. Lower lung volumes with slight progression in COVID pneumonia from radiograph yesterday. 3. Right upper extremity PICC tip in the mid SVC. These results will be called to the ordering clinician or representative by the Radiologist Assistant, and communication documented in the PACS or [employer]. Electronically Signed   By: Narda Rutherford M.D.   On: 12/28/2019 01:05   DG Chest Port 1 View  Result Date: 12/27/2019 CLINICAL DATA:  COVID positive. EXAM: PORTABLE CHEST 1 VIEW COMPARISON:  12/27/2019, earlier same day FINDINGS: 0712 hours. Endotracheal tube tip is 4.5 cm above the base of the carina. The NG tube passes into the stomach although the distal tip position is not included on the film. Cardiopericardial silhouette is at upper limits of normal for size. Interval improvement in the bilateral diffuse airspace disease. No appreciable pleural effusion. No pneumothorax. Telemetry leads overlie the chest. IMPRESSION: Interval improvement in the bilateral diffuse airspace  disease. Electronically Signed   By: Kennith Center M.D.   On: 12/27/2019 07:24   DG Chest Portable 1 View  Result Date: 12/27/2019 CLINICAL DATA:  Intubation EXAM: PORTABLE CHEST 1 VIEW COMPARISON:  Yesterday FINDINGS: Endotracheal tube with tip at the clavicular heads. The enteric tube reaches the stomach. Dense and progressive bilateral chest opacification with limited aerated lung. Large appearance of the heart but largely obscured. No visible air leak. IMPRESSION: 1. Unremarkable hardware positioning. 2. Severe generalized opacification chest. Electronically Signed   By: Marnee Spring M.D.   On: 12/27/2019 05:38   DG Chest Port 1 View  Result Date: 12/13/2019 CLINICAL DATA:  Shortness of breath and sepsis. EXAM: PORTABLE CHEST 1 VIEW COMPARISON:  None. FINDINGS: The cardiomediastinal silhouette is unremarkable. Diffuse bilateral airspace opacities are noted. No pneumothorax or large pleural effusion identified. No acute bony abnormalities are present. IMPRESSION: Diffuse bilateral airspace opacities - question edema versus infection/pneumonia. Electronically Signed   By: Harmon Pier M.D.   On: 12/29/2019 14:23   ECHOCARDIOGRAM COMPLETE  Result Date: 01/07/2020    ECHOCARDIOGRAM REPORT   Patient Name:   Carrie Davies [facility] Date of Exam: 01/05/2020 Medical Rec #:  [id number]       Height:       66.0 in Accession #:    [id number]      Weight:       263.7 lb Date of Birth:  1965/03/25        BSA:          2.249 m Patient Age:    54 years        BP:           95/55 mmHg Patient Gender: F               HR:           73 bpm. Exam Location:  [facility] Procedure: 2D Echo, Cardiac Doppler and Color Doppler Indications:     Cardiomyopathy- unspecified 425.9  History:         Patient has no prior history of  Echocardiogram examinations.                  Signs/Symptoms:Shortness of Breath; Risk Factors:Hypertension.                  PVD.  Sonographer:     Cristela Blue RDCS (AE) Referring Phys:  [id number] BRITTON L  [place] Diagnosing Phys: Julien Nordmann MD  Sonographer Comments: Echo performed with patient supine and on artificial respirator and Technically difficult study due to poor echo windows. The only view obtainable was parasternal. IMPRESSIONS  1. Left ventricular ejection fraction, by estimation, is 60 to 65%. The left ventricle has normal function. The left ventricle has no regional wall motion abnormalities. There is mild left ventricular hypertrophy. Left ventricular diastolic parameters are indeterminate.  2. Right ventricular systolic function is normal. The right ventricular size is normal. Tricuspid regurgitation signal is inadequate for assessing PA pressure. FINDINGS  Left Ventricle: Left ventricular ejection fraction, by estimation, is 60 to 65%. The left ventricle has normal function. The left ventricle has no regional wall motion abnormalities. The left ventricular internal cavity size was normal in size. There is  mild left ventricular hypertrophy. Left ventricular diastolic parameters are indeterminate. Right Ventricle: The right ventricular size is normal. No increase in right ventricular wall thickness. Right ventricular systolic function is normal. Tricuspid regurgitation signal is inadequate for assessing PA pressure. Left Atrium: Left atrial size was normal in size. Right Atrium: Right atrial size was normal in size. Pericardium: A small pericardial effusion is present. Mitral Valve: The mitral valve was not well visualized. No evidence of mitral valve regurgitation. No evidence of mitral valve stenosis. Tricuspid Valve: The tricuspid valve is not well visualized. Tricuspid valve regurgitation is not demonstrated. No evidence of tricuspid stenosis. Aortic Valve: The aortic valve was not well visualized. Aortic valve regurgitation is not visualized. No aortic stenosis is present. Pulmonic Valve: The pulmonic valve was normal in structure. Pulmonic valve regurgitation is not visualized. No  evidence of pulmonic stenosis. Aorta: The aortic root is normal in size and structure. Venous: The pulmonary veins were not well visualized. The inferior vena cava is normal in size with greater than 50% respiratory variability, suggesting right atrial pressure of 3 mmHg. IAS/Shunts: No atrial level shunt detected by color flow Doppler.  LEFT VENTRICLE PLAX 2D LVIDd:         4.40 cm LVIDs:         2.57 cm LV PW:         1.45 cm LV IVS:        1.44 cm LVOT diam:     2.00 cm LVOT Area:     3.14 cm  LEFT ATRIUM         Index LA diam:    3.20 cm 1.42 cm/m                        PULMONIC VALVE AORTA                 PV Vmax:        0.93 m/s Ao Root diam: 2.60 cm PV Peak grad:   3.5 mmHg                       RVOT Peak grad: 5 mmHg  TRICUSPID VALVE TR Peak grad:   16.3 mmHg TR Vmax:        202.00 cm/s  SHUNTS Systemic Diam: 2.00 cm Marcial Pacas  Mariah Milling MD Electronically signed by Julien Nordmann MD Signature Date/Time: 01/07/2020/7:43:21 AM    Final    ECHOCARDIOGRAM LIMITED  Result Date: 01/08/2020    ECHOCARDIOGRAM LIMITED REPORT   Patient Name:   Carrie Davies [facility] Date of Exam: 01/08/2020 Medical Rec #:  [id number]       Height:       66.0 in Accession #:    [id number]      Weight:       266.3 lb Date of Birth:  07/29/1965        BSA:          2.259 m Patient Age:    54 years        BP:           147/77 mmHg Patient Gender: F               HR:           62 bpm. Exam Location:  [facility] Procedure: 2D Echo Indications:     Acute Respiratory Insufficiency 518.82 / R06.89  History:         Patient has prior history of Echocardiogram examinations, most                  recent 01/06/2020.  Sonographer:     Johnathan Hausen Referring Phys:  [id number] Diagnosing Phys: Debbe Odea MD IMPRESSIONS  1. Left ventricular ejection fraction, by estimation, is 60 to 65%. The left ventricle has normal function. The left ventricle has no regional wall motion abnormalities.  2. Right ventricular systolic function is normal. The right  ventricular size is normal. There is normal pulmonary artery systolic pressure.  3. The mitral valve is normal in structure. No evidence of mitral valve regurgitation.  4. The aortic valve is grossly normal. Aortic valve regurgitation is not visualized.  5. The inferior vena cava is dilated in size with >50% respiratory variability, suggesting right atrial pressure of 8 mmHg. FINDINGS  Left Ventricle: Left ventricular ejection fraction, by estimation, is 60 to 65%. The left ventricle has normal function. The left ventricle has no regional wall motion abnormalities. The left ventricular internal cavity size was normal in size. There is  no left ventricular hypertrophy. Right Ventricle: The right ventricular size is normal. Right ventricular systolic function is normal. There is normal pulmonary artery systolic pressure. The tricuspid regurgitant velocity is 2.58 m/s, and with an assumed right atrial pressure of 8 mmHg,  the estimated right ventricular systolic pressure is 34.6 mmHg. Pericardium: There is no evidence of pericardial effusion. Mitral Valve: The mitral valve is normal in structure. Tricuspid Valve: The tricuspid valve is not well visualized. Tricuspid valve regurgitation is not demonstrated. Aortic Valve: The aortic valve is grossly normal. Aortic valve regurgitation is not visualized. Pulmonic Valve: The pulmonic valve was not well visualized. Aorta: The aortic root is normal in size and structure. Venous: The inferior vena cava is dilated in size with greater than 50% respiratory variability, suggesting right atrial pressure of 8 mmHg. IVC IVC diam: 2.35 cm TRICUSPID VALVE TR Peak grad:   26.6 mmHg TR Vmax:        258.00 cm/s Debbe Odea MD Electronically signed by Debbe Odea MD Signature Date/Time: 01/08/2020/3:14:41 PM    Final    [place] EKG SITE RITE  Result Date: 01/13/2020 If Site Rite image not attached, placement could not be confirmed due to current cardiac rhythm.  [place] EKG SITE  RITE  Result Date: 12/27/2019 If  Site Rite image not attached, placement could not be confirmed due to current cardiac rhythm.   Microbiology Recent Results (from the past 240 hour(s))  Culture, respiratory     Status: None   Collection Time: 01/16/20  4:01 PM   Specimen: Tracheal Aspirate; Respiratory  Result Value Ref Range Status   Specimen Description   Final    TRACHEAL ASPIRATE Performed at [facility], [address], [place], [place] [address]    Special Requests   Final    NONE Performed at [facility], [address] [place], [place] [address]    Gram Stain   Final    MODERATE WBC PRESENT, PREDOMINANTLY PMN MODERATE GRAM NEGATIVE RODS RARE GRAM POSITIVE COCCI IN CLUSTERS    Culture   Final    MODERATE STAPHYLOCOCCUS AUREUS ABUNDANT HAEMOPHILUS INFLUENZAE BETA LACTAMASE POSITIVE Performed at [facility], [address] [place], [place] [address]    Report Status 01/07/2020 FINAL  Final   Organism ID, Bacteria STAPHYLOCOCCUS AUREUS  Final      Susceptibility   Staphylococcus aureus - MIC*    CIPROFLOXACIN <=0.5 SENSITIVE Sensitive     ERYTHROMYCIN <=0.25 SENSITIVE Sensitive     GENTAMICIN <=0.5 SENSITIVE Sensitive     OXACILLIN <=0.25 SENSITIVE Sensitive     TETRACYCLINE <=1 SENSITIVE Sensitive     VANCOMYCIN <=0.5 SENSITIVE Sensitive     TRIMETH/SULFA <=10 SENSITIVE Sensitive     CLINDAMYCIN <=0.25 SENSITIVE Sensitive     RIFAMPIN <=0.5 SENSITIVE Sensitive     Inducible Clindamycin NEGATIVE Sensitive     * MODERATE STAPHYLOCOCCUS AUREUS    Lab Basic Metabolic Panel: Recent Labs  Lab 01/14/20 0344 01/14/20 0344 01/15/20 0407 01/15/20 0407 01/16/20 0310 01/16/20 0310 01/17/20 0238 01/17/20 0238 01/17/20 0905 01/18/20 0447 01/18/20 0818 01/18/20 1300 01/31/2020 1540  NA 141   < > 141  --  139  --  144  --   --  144  --   --  136  K 4.5   < > 4.6   < > 4.9   < > 5.3*   < > 5.0 5.7* 6.4* 4.4 6.9*  CL 89*   < > 90*  --   86*  --  89*  --   --  93*  --   --  89*  CO2 40*   < > 39*  --  38*  --  39*  --   --  40*  --   --  28  GLUCOSE 166*   < > 220*  --  219*  --  268*  --   --  319*  --   --  224*  BUN 42*   < > 47*  --  48*  --  61*  --   --  113*  --   --  191*  CREATININE 0.53   < > 0.40*  --  0.43*  --  0.54  --   --  0.92  --   --  2.12*  CALCIUM 8.9   < > 8.5*  --  8.4*  --  8.3*  --   --  8.1*  --   --  7.2*  MG 2.5*  --  2.3  --   --   --  2.5*  --   --  2.8*  --   --  3.5*  PHOS 4.7*   < > 3.7  --  4.1  --  5.4*  --   --  4.5  --   --  11.7*   < > = values in this interval not displayed.   Liver Function Tests: Recent Labs  Lab 01/15/20 0407 01/16/20 0310 01/17/20 0238 01/18/20 0447 01/24/20 1540  AST 30 39 51* 56* 795*  ALT 41 41 53* 55* 799*  ALKPHOS 46 45 45 42 66  BILITOT 0.7 0.9 0.8 0.8 1.3*  PROT 6.5 6.3* 6.3* 5.5* 5.1*  ALBUMIN 2.5* 2.6* 2.7* 2.4* 2.6*   No results for input(s): LIPASE, AMYLASE in the last 168 hours. No results for input(s): AMMONIA in the last 168 hours. CBC: Recent Labs  Lab 01/15/20 0407 01/15/20 0407 01/16/20 0310 01/16/20 0310 01/17/20 0238 01/18/20 0447 01/18/20 0818 01/18/20 1400 Jan 24, 2020 1540  WBC 23.7*  --  23.3*  --  25.1* 35.9*  --   --  50.9*  NEUTROABS 20.2*  --  20.3*  --  22.0* 29.1*  --   --  38.3*  HGB 10.6*   < > 10.1*   < > 10.0* 8.7* 8.5* 6.6* 7.7*  HCT 34.1*   < > 33.5*   < > 34.1* 29.4* 28.5* 22.9* 26.2*  MCV 96.1  --  96.3  --  100.9* 101.0*  --   --  100.4*  PLT 35*  --  32*  --  44* 120*  --   --  154   < > = values in this interval not displayed.   Cardiac Enzymes: No results for input(s): CKTOTAL, CKMB, CKMBINDEX, TROPONINI in the last 168 hours. Sepsis Labs: Recent Labs  Lab 01/16/20 0310 01/17/20 0238 01/18/20 0447 24-Jan-2020 1540  WBC 23.3* 25.1* 35.9* 50.9*    Procedures/Operations  Mechanical Intubation  Right Upper Extremity PICC Line Placement  Sonda Rumble, AGNP  Pulmonary/Critical Care Pager  [phone] (please enter 7 digits) PCCM Consult Pager [phone] (please enter 7 digits)

## 2020-02-02 NOTE — Progress Notes (Signed)
GOALS OF CARE DISCUSSION  The Clinical status was relayed to family in detail-husband at bedside.  Updated and notified of patients medical condition.  Patient remains unresponsive and will not open eyes to command.      patient with increased WOB and using accessory muscles to breathe Explained to family course of therapy and the modalities     Patient with Progressive multiorgan failure with very low chance of meaningful recovery despite all aggressive and optimal medical therapy. Patient is in the Dying  Process associated with Suffering.  LABS REVIEWED, AT THIS TIME, I HAVE EXPLAINED THAT PATIENT NOW HAS MULTIORGAN  FAILURE AND PATIENT IS DYING PROCESS, I DISCUSSED NOT DOING HEMODIALYSIS, CONTINUE VASOPRESSORS AS THEY ARE, THERE IS NOTHING MUCH MORE WE CAN DO AT THIS TIME.   HUSBAND understands the situation. VERY TEARFUL, HUSBAND IS WAITING FOR PATIENT TO PASS, WE WILL NOT WITHDRAW CARE AT THIS TIME   Family are satisfied with Plan of action and management. All questions answered  Additional CC time 32 mins   [place] Hankin Santiago Glad, M.D.  [facility]  Medical Director [facility] Medical Director [facility] Cardio-Pulmonary Department

## 2020-02-02 NOTE — Progress Notes (Signed)
CRITICAL CARE NOTE 54 yo female admitted with acute hypoxic respiratory failure due to COVID-19 infection requiring mechanical intubation and ventilation.  Past Medical History  Hepatitis HTN Sciatica  Significant Hospital Events   10/24 severe hypoxia. COVID Positivie 10/25 intubation and MV support and severe hypoxia, ETT tube exchanged 10/26-10/31 severe hypoxia and severe ARDS 10/30 - 2h of nimbex and  10/31 early hours - 24h of vec 11/1 high risk for cardiac arrest and death. MSSA on Trch aspiiate Jan 19, 2023 severe ARDS, s/p proning. R calf DVT -started on heparin gtt 11/4 severe hypoxia-did NOT tolerate proning. ECHO 0 normal LV and RV 11/5 - 75% fio32, high peep. Not on pressor.SUpine ventilation. Prn vec. Making urine. On dilaudid gtt and versed gtt. Per Pharmacy got 2h of nimbex and then 24h of continuous on 10/30 - 10/31. +8L since admit. Heparn gtt contiues  01/08/2020-desaturatiion overnight with worsening PF ratio. REdness in neck after niimbex. Now Prone since 0.30 today . On vec gtt with sedation gtt. Fio2 100% , peep 16 -> pulse ox 88%.. Not on perssors. Making urine. Diuresing well. Down t to +4L volume overload since admit.LITD CODe 11/8 remains very sick, severe ARDS, FULL CODE, husband updated 11/10- patient had not tolerated proning. She is making urine. CXR with pulm edema, will diurese and monitor PICC CVP trend only.  11/11-11/12continue current care, patient unchanged over 24h with severe ARDS. Poor prognosis maximal support on ventilatory with hypoxemia <70%. Husband was updated and asked to come in person as patient high risk of cardiac/respiratory arrest. 11/13- patient off paralytics, she is on pressure control without dyssynchrony and lung mechanics. Plan to continue fully aggressive measures as per husband. Lasix increased today due to improved BP.  11/14-patient continues to deteriorate despite maximally aggressive care. Husband at bedside COVID  precautions down due to >21d period. Repeat tracheal aspirate, started empiric abx due to fever. RT for vent changes - despite multiple attempts saturations are at times <70% 11/15severe hypoxia, high risk for death, GOC discussion with family- code status changed to DNR 11/16-11/17 severe shock, active bleeding  CC  follow up respiratory failure  SUBJECTIVE Patient remains critically ill Prognosis is guarded Severe shock Active GIB Severe shock  Vent Mode: PRVC FiO2 (%):  [100 %] 100 % Set Rate:  [26 bmp] 26 bmp Vt Set:  [450 mL-460 mL] 460 mL PEEP:  [16 cmH20] 16 cmH20 Plateau Pressure:  [38 cmH20-39 cmH20] 38 cmH20 BMP Latest Ref Rng & Units 01/18/2020 01/18/2020 01/18/2020  Glucose 70 - 99 mg/dL - - 286(N)  BUN 6 - 20 mg/dL - - 817(R)  Creatinine 0.44 - 1.00 mg/dL - - 1.16  BUN/Creat Ratio 9 - 23 - - -  Sodium 135 - 145 mmol/L - - 144  Potassium 3.5 - 5.1 mmol/L 4.4 6.4(HH) 5.7(H)  Chloride 98 - 111 mmol/L - - 93(L)  CO2 22 - 32 mmol/L - - 40(H)  Calcium 8.9 - 10.3 mg/dL - - 8.1(L)     BP 131/63 (BP Location: Left Arm)   Pulse (!) 103   Temp (!) 96.3 F (35.7 C) (Oral)   Resp (!) 26   Ht 5\' 6"  (1.676 m)   Wt 110.4 kg   LMP 08/16/2014   SpO2 (!) 80%   BMI 39.28 kg/m    I/O last 3 completed shifts: In: 3529.6 [I.V.:2868.6; NG/GT:472.5; IV Piggyback:188.5] Out: 516 [Urine:426; Stool:90] Total I/O In: 309.4 [I.V.:309.4] Out: -   SpO2: (!) 80 % O2 Flow Rate (L/min): 0  L/min (no flow data to report) FiO2 (%): 100 %  Estimated body mass index is 39.28 kg/m as calculated from the following:   Height as of this encounter: 5\' 6"  (1.676 m).   Weight as of this encounter: 110.4 kg.  SIGNIFICANT EVENTS   REVIEW OF SYSTEMS  PATIENT IS UNABLE TO PROVIDE COMPLETE REVIEW OF SYSTEMS DUE TO SEVERE CRITICAL ILLNESS        PHYSICAL EXAMINATION:  GENERAL:critically ill appearing, +resp distress HEAD: Normocephalic, atraumatic.  EYES: Pupils equal, round,  reactive to light.  No scleral icterus.  MOUTH: Moist mucosal membrane. NECK: Supple.  PULMONARY: +rhonchi, +wheezing CARDIOVASCULAR: S1 and S2. Regular rate and rhythm. No murmurs, rubs, or gallops.  GASTROINTESTINAL: Soft, nontender, -distended.  Positive bowel sounds.   MUSCULOSKELETAL: No swelling, clubbing, or edema.  NEUROLOGIC: obtunded, GCS<8 SKIN:intact,warm,dry  MEDICATIONS: I have reviewed all medications and confirmed regimen as documented   CULTURE RESULTS   Recent Results (from the past 240 hour(s))  Culture, respiratory     Status: None   Collection Time: 01/16/20  4:01 PM   Specimen: Tracheal Aspirate; Respiratory  Result Value Ref Range Status   Specimen Description   Final    TRACHEAL ASPIRATE Performed at [facility], [address] [place], [place]    Special Requests   Final    NONE Performed at [facility], [address] [place], [place]    Gram Stain   Final    MODERATE WBC PRESENT, PREDOMINANTLY PMN MODERATE GRAM NEGATIVE RODS RARE GRAM POSITIVE COCCI IN CLUSTERS    Culture   Final    MODERATE STAPHYLOCOCCUS AUREUS ABUNDANT HAEMOPHILUS INFLUENZAE BETA LACTAMASE POSITIVE Performed at [facility], [address], [place], [place]    Report Status 01/11/2020 FINAL  Final   Organism ID, Bacteria STAPHYLOCOCCUS AUREUS  Final      Susceptibility   Staphylococcus aureus - MIC*    CIPROFLOXACIN <=0.5 SENSITIVE Sensitive     ERYTHROMYCIN <=0.25 SENSITIVE Sensitive     GENTAMICIN <=0.5 SENSITIVE Sensitive     OXACILLIN <=0.25 SENSITIVE Sensitive     TETRACYCLINE <=1 SENSITIVE Sensitive     VANCOMYCIN <=0.5 SENSITIVE Sensitive     TRIMETH/SULFA <=10 SENSITIVE Sensitive     CLINDAMYCIN <=0.25 SENSITIVE Sensitive     RIFAMPIN <=0.5 SENSITIVE Sensitive     Inducible Clindamycin NEGATIVE Sensitive     * MODERATE STAPHYLOCOCCUS AUREUS        Indwelling Urinary Catheter continued,  requirement due to   Reason to continue Indwelling Urinary Catheter strict Intake/Output monitoring for hemodynamic instability   Central Line/ continued, requirement due to  Reason to continue 01/21/2020 Monitoring of central venous pressure or other hemodynamic parameters and poor IV access   Ventilator continued, requirement due to severe respiratory failure   Ventilator Sedation RASS 0 to -2      ASSESSMENT AND PLAN SYNOPSIS   Severe ACUTE Hypoxic and Hypercapnic Respiratory Failure due to COVID 19 pneumonia complicated by acute DVT presumed PE with diastolic heart failure with morbid obesity with active bleeding with septic and hypovolumic shock Unable to wean from vent from vent  ACUTE DIASTOLIC CARDIAC FAILURE- EF  Morbid obesity, possible OSA.   Will certainly impact respiratory mechanics, ventilator weaning Suspect will need to consider additional PEEP  ACUTE KIDNEY INJURY/Renal Failure -continue Foley Catheter-assess need -Avoid nephrotoxic agents -Follow urine output, BMP -Ensure adequate renal perfusion, optimize oxygenation -Renal dose medications  NEUROLOGY Acute toxic metabolic encephalopathy, need for sedation Goal RASS -2 to -3  SHOCK-SEPSIS/HYPOVOLUMIC -use vasopressors to keep MAP>65  CARDIAC ICU monitoring   GI GI PROPHYLAXIS as indicated  NUTRITIONAL STATUS Nutrition Status: Nutrition Problem: Inadequate oral intake Etiology: inability to eat Signs/Symptoms: NPO status Interventions: Tube feeding, Prostat, MVI   DIET-->TF's as tolerated Constipation protocol as indicated  ENDO - will use ICU hypoglycemic\Hyperglycemia protocol if indicated     ELECTROLYTES -follow labs as needed -replace as needed -pharmacy consultation and following   DVT/GI PRX ordered and assessed TRANSFUSIONS AS NEEDED MONITOR FSBS I Assessed the need for Labs I Assessed the need for Foley I Assessed the need for Central Venous Line Family  Discussion when available I Assessed the need for Mobilization I made an Assessment of medications to be adjusted accordingly Safety Risk assessment completed   CASE DISCUSSED IN MULTIDISCIPLINARY ROUNDS WITH ICU TEAM  Critical Care Time devoted to patient care services described in this note is 75 minutes.   Overall, patient is critically ill, prognosis is guarded.  Patient with Multiorgan failure and at high risk for cardiac arrest and death.   Overall poor prognosis, very poor chance of meaningful recovery Patient is suffering and in dying process awaiting for husband to arrive to explained clinical findings Patient will not survive this admission   Lucie Leather, M.D.  [facility]  Medical Director [facility] Medical Director [facility] Cardio-Pulmonary Department

## 2020-02-02 NOTE — Progress Notes (Addendum)
CH visited pt.'s husband at bedside several times today, supporting husband as he processes decisions re: plan for pt.'s care.  Husband sees three options before him: extubation accompanied by potential shock and pain for pt., weaning of BP meds and pt. passing relatively peacefully, or continuing with current medical interventions until pt.'s organs shut down.  Husband is leaning toward discontinuing BP meds, but is struggling to make final decision; is feeling the weight of this decision very acutely.  He says if he does not make the decision tonight he will do it tomorrow.  Husband experiencing anger related to pt.'s COVID infection and the 'unfairness' of the severity of her illness.  He is worried about how his son will take the news that pt. has died when she eventually passes.  Husband also verbalized feeling like he himself has no support from family/community as he is faced with difficult decisions, task of making arrangements, responsibility of caring for son after pt. is gone.  CH called away from visits by other referrals, but plans to check in tomorrow; husband aware of evening chaplain's availability if needed.

## 2020-02-02 NOTE — Progress Notes (Signed)
Daily Progress Note   Patient Name: Carrie Davies       Date: 01/26/20 DOB: 1965/05/01  Age: 54 y.o. MRN#: [id number] Attending Physician: Flora Lipps, MD Primary Care Physician: [place] Pink, MD Admit Date: 12/22/2019  Reason for Consultation/Follow-up: Establishing goals of care  Subjective: Intubated, sedated - spouse at bedside  Length of Stay: 24  Current Medications: Scheduled Meds:  . artificial tears  1 application Both Eyes H4F  . azithromycin  500 mg Per Tube Daily  . chlorhexidine gluconate (MEDLINE KIT)  15 mL Mouth Rinse BID  . Chlorhexidine Gluconate Cloth  6 each Topical Daily  . feeding supplement (PROSource TF)  45 mL Per Tube TID  . free water  30 mL Per Tube Q4H  . furosemide  40 mg Intravenous Daily  .  HYDROmorphone (DILAUDID) injection  1 mg Intravenous Once  . insulin aspart  0-20 Units Subcutaneous Q4H  . mouth rinse  15 mL Mouth Rinse 10 times per day  . methylPREDNISolone (SOLU-MEDROL) injection  20 mg Intravenous BID  . midodrine  10 mg Per Tube BID WC  . multivitamin with minerals  1 tablet Per Tube Daily  . pantoprazole sodium  40 mg Per Tube BID  . sodium chloride flush  3 mL Intravenous Q12H    Continuous Infusions: . sodium chloride Stopped (01/12/20 2259)  . sodium chloride 5 mL/hr at 01-26-2020 0600  . albumin human 12.5 g (2020-01-26 0926)  . cefTRIAXone (ROCEPHIN)  IV Stopped (01/18/20 1451)  . feeding supplement (VITAL 1.5 CAL) 1,000 mL (26-Jan-2020 0051)  . HYDROmorphone 4 mg/hr (January 26, 2020 0645)  . midazolam 10 mg/hr (2020/01/26 1145)  . norepinephrine (LEVOPHED) Adult infusion 40 mcg/min (26-Jan-2020 1042)  . vecuronium (NORCURON) infusion 175m/100mL (1 mg/mL) Stopped (01/17/20 0835)    PRN Meds: sodium chloride, acetaminophen, docusate,  HYDROmorphone, midazolam, midazolam, ondansetron (ZOFRAN) IV, polyethylene glycol, sodium chloride flush, sodium chloride flush  Physical Exam Constitutional:      Comments: Intubated, sedated, not interactive  Cardiovascular:     Rate and Rhythm: Normal rate.  Pulmonary:     Comments: sats remain ~80% on 100% fiO2 Skin:    Comments: Cool ext             Vital Signs: BP 120/62   Pulse (!) 101   Temp (!) 96.3 F (35.7 C) (Oral)   Resp (!) 26   Ht 5' 6"  (1.676 m)   Wt 110.4 kg   LMP 08/16/2014   SpO2 (!) 82%   BMI 39.28 kg/m  SpO2: SpO2: (!) 82 % O2 Device: O2 Device: Ventilator O2 Flow Rate: O2 Flow Rate (L/min): 0 L/min (no flow data to report)  Intake/output summary:   Intake/Output Summary (Last 24 hours) at [id number] Last data filed at [id number] Gross per 24 hour  Intake 3024.49 ml  Output 243 ml  Net 2781.49 ml   LBM: Last BM Date: 01/18/20 (flexiseal) Baseline Weight: Weight: 123.8 kg Most recent weight: Weight: 110.4 kg        Flowsheet Rows     Most Recent Value  Intake Tab  Referral Department Critical care  Unit at Time of Referral ICU  Palliative Care Primary Diagnosis  Sepsis/Infectious Disease  Date Notified 01/14/20  Palliative Care Type New Palliative care  Reason for referral Clarify Goals of Care  Date of Admission 12/27/2019  Date first seen by Palliative Care 01/14/20  # of days Palliative referral response time 0 Day(s)  # of days IP prior to Palliative referral 19  Clinical Assessment  Palliative Performance Scale Score 10%  Psychosocial & Spiritual Assessment  Palliative Care Outcomes  Patient/Family meeting held? Yes  Who was at the meeting? spouse  Palliative Care Outcomes Clarified goals of care, Provided psychosocial or spiritual support      Patient Active Problem List   Diagnosis Date Noted  . Advanced care planning/counseling discussion   . Goals of care, counseling/discussion   . Palliative care by specialist    . Acute respiratory failure due to COVID-19 ([place]) 12/30/2019  . Obesity 07/18/2014  . HTN (hypertension) 07/18/2014    Palliative Care Assessment & Plan   HPI: 54 y.o. female  with past medical history of HTN admitted on 12/28/2019 with shortness of breath and cough. Found to be hypoxic in ED.  COVID-19 positive. Required intubation 10/25. Also with bacterial pna. Has R calf DVT. Has not been able to tolerate prone position. PMT consulted for [name].   Assessment: Follow up with Ed at bedside.   Ed shares his concerns/frustrations with situation.  We again reviewed severity of Mckenzye's illness - dependence on ventilator/vasopressors. Now with ?GI bleed. Ed asks multiple questions about the care Jomayra is receiving and these were addressed to the best of my ability. Asked Ed to consider the big picture and try not to focus in on specifics that do not change ultimate situation. Ed expresses that he understands Caileen is very ill and will not survive - he has explored funeral arrangements. He shares he feels unable to make a decision to free Verdis Frederickson from aggressive medical interventions. We discuss that this is the medical team's recommendation and he does not have to feel like the burden of this decision is on him alone. Discussed making decisions for Keslee that she would make for herself if she were able. Ed requests more time to consider the situation. Attempted to provide emotional support to Ed throughout conversation.   Recommendations/Plan: Ongoing [place] conversations  Care plan was discussed with Dr. Pincus Sanes NP, RN, and spouse Ed  Thank you for allowing the Palliative Medicine Team to assist in the care of this patient.   Total Time 45 minutes Prolonged Time Billed  no       Greater than 50%  of this time was spent counseling and coordinating care related to the above assessment and plan.  Juel Burrow, DNP, [facility] Palliative Medicine Team Team Phone # [phone]  Pager  [phone]

## 2020-02-02 DEATH — deceased

## 2020-12-26 ENCOUNTER — Encounter: Payer: BC Managed Care – PPO | Admitting: Certified Nurse Midwife

## 2021-04-24 IMAGING — DX DG CHEST 1V PORT
1 series · 1 of 1 positions shown · non-contrast
Comparison: January 09, 2020.

CLINICAL DATA: Hypoxia

EXAM:
PORTABLE CHEST 1 VIEW

[chest ap]
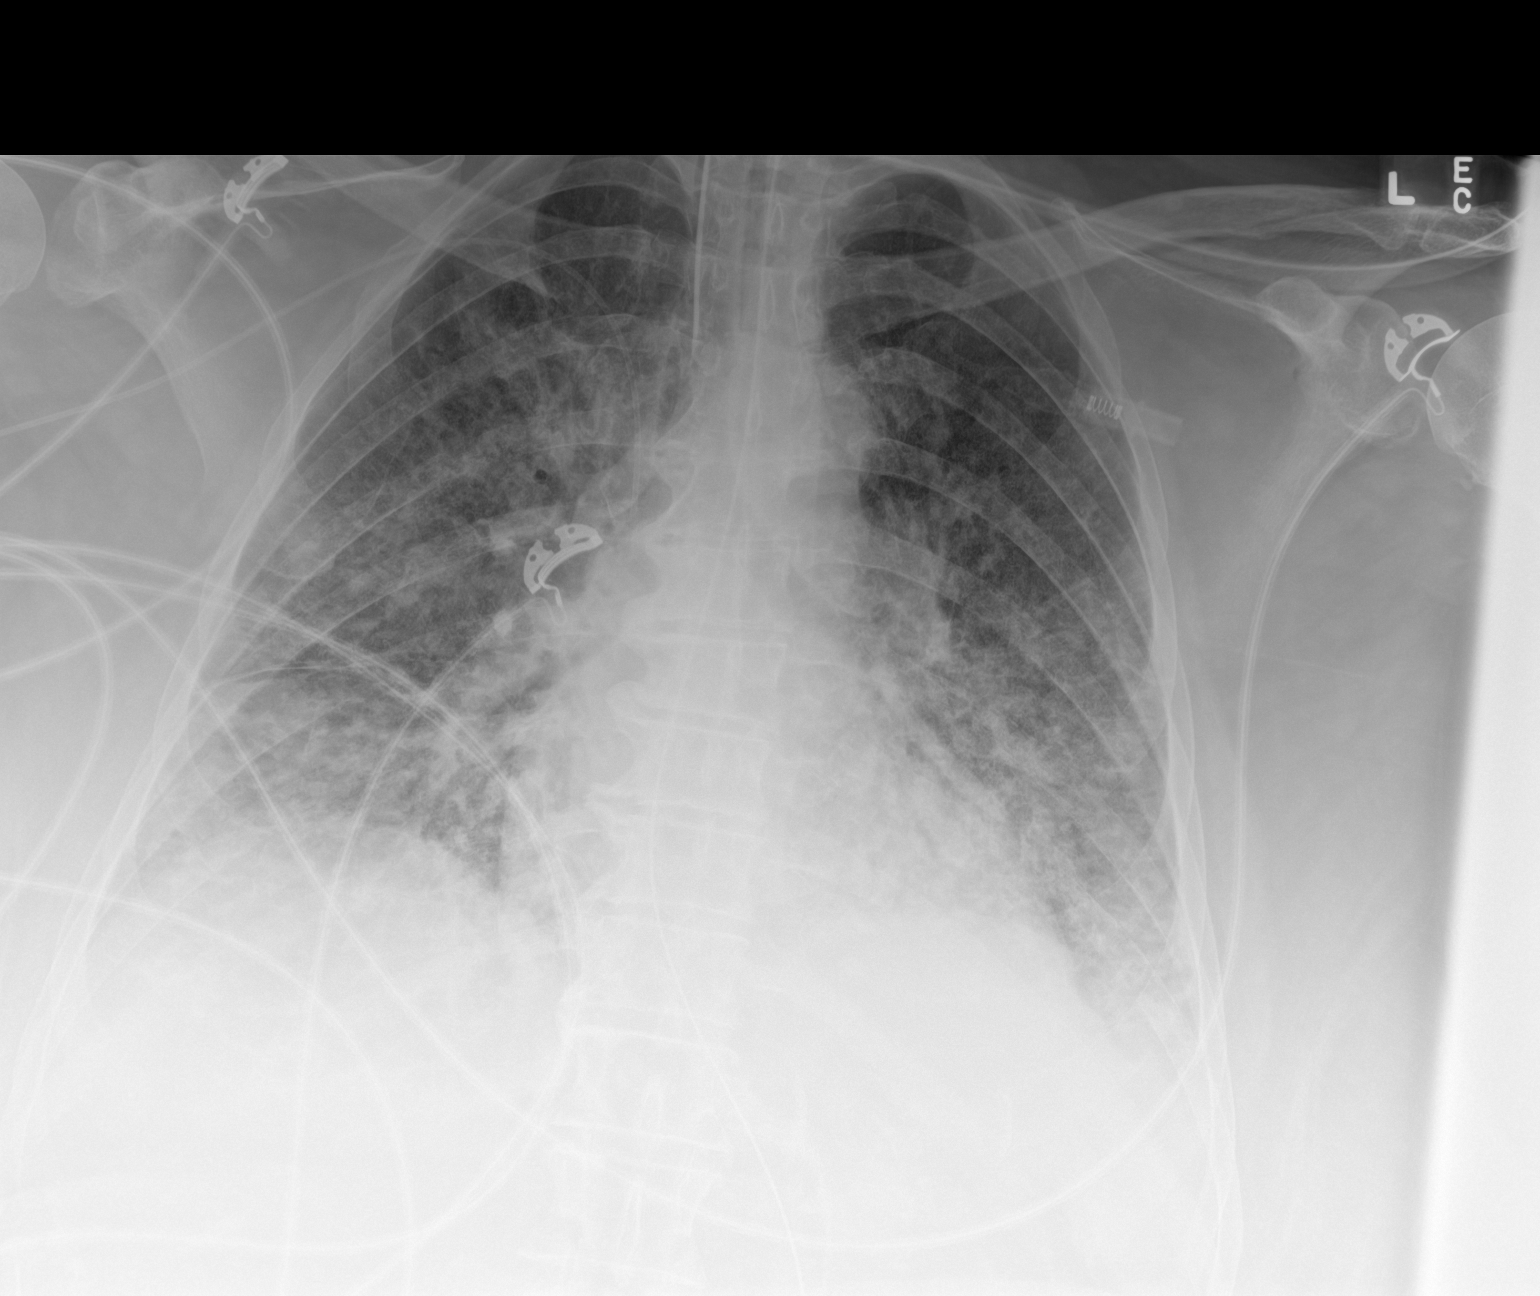

[1 of 1 positions shown; findings below may reference images not displayed]

FINDINGS: Endotracheal tube tip is 4.1 cm above the carina. Central catheter
tip is in the superior vena cava. Nasogastric tube tip and side port
are below the diaphragm. No pneumothorax. There is airspace opacity
scattered throughout the lungs bilaterally, similar to 1 day prior.
Heart is upper normal in size with pulmonary vascularity normal. No
adenopathy. No bone lesions.
IMPRESSION: Tube and catheter positions as described without pneumothorax.
Widespread multifocal airspace opacity consistent with multifocal
pneumonia, essentially stable. Stable cardiac silhouette.

## 2021-04-30 IMAGING — DX DG CHEST 1V PORT
1 series · 1 of 1 positions shown · non-contrast
Comparison: January 16, 2020 study obtained earlier in the day

CLINICAL DATA: Hypertension.  [id number] positive.  Hypoxia.

EXAM:
PORTABLE CHEST 1 VIEW

[chest ap]
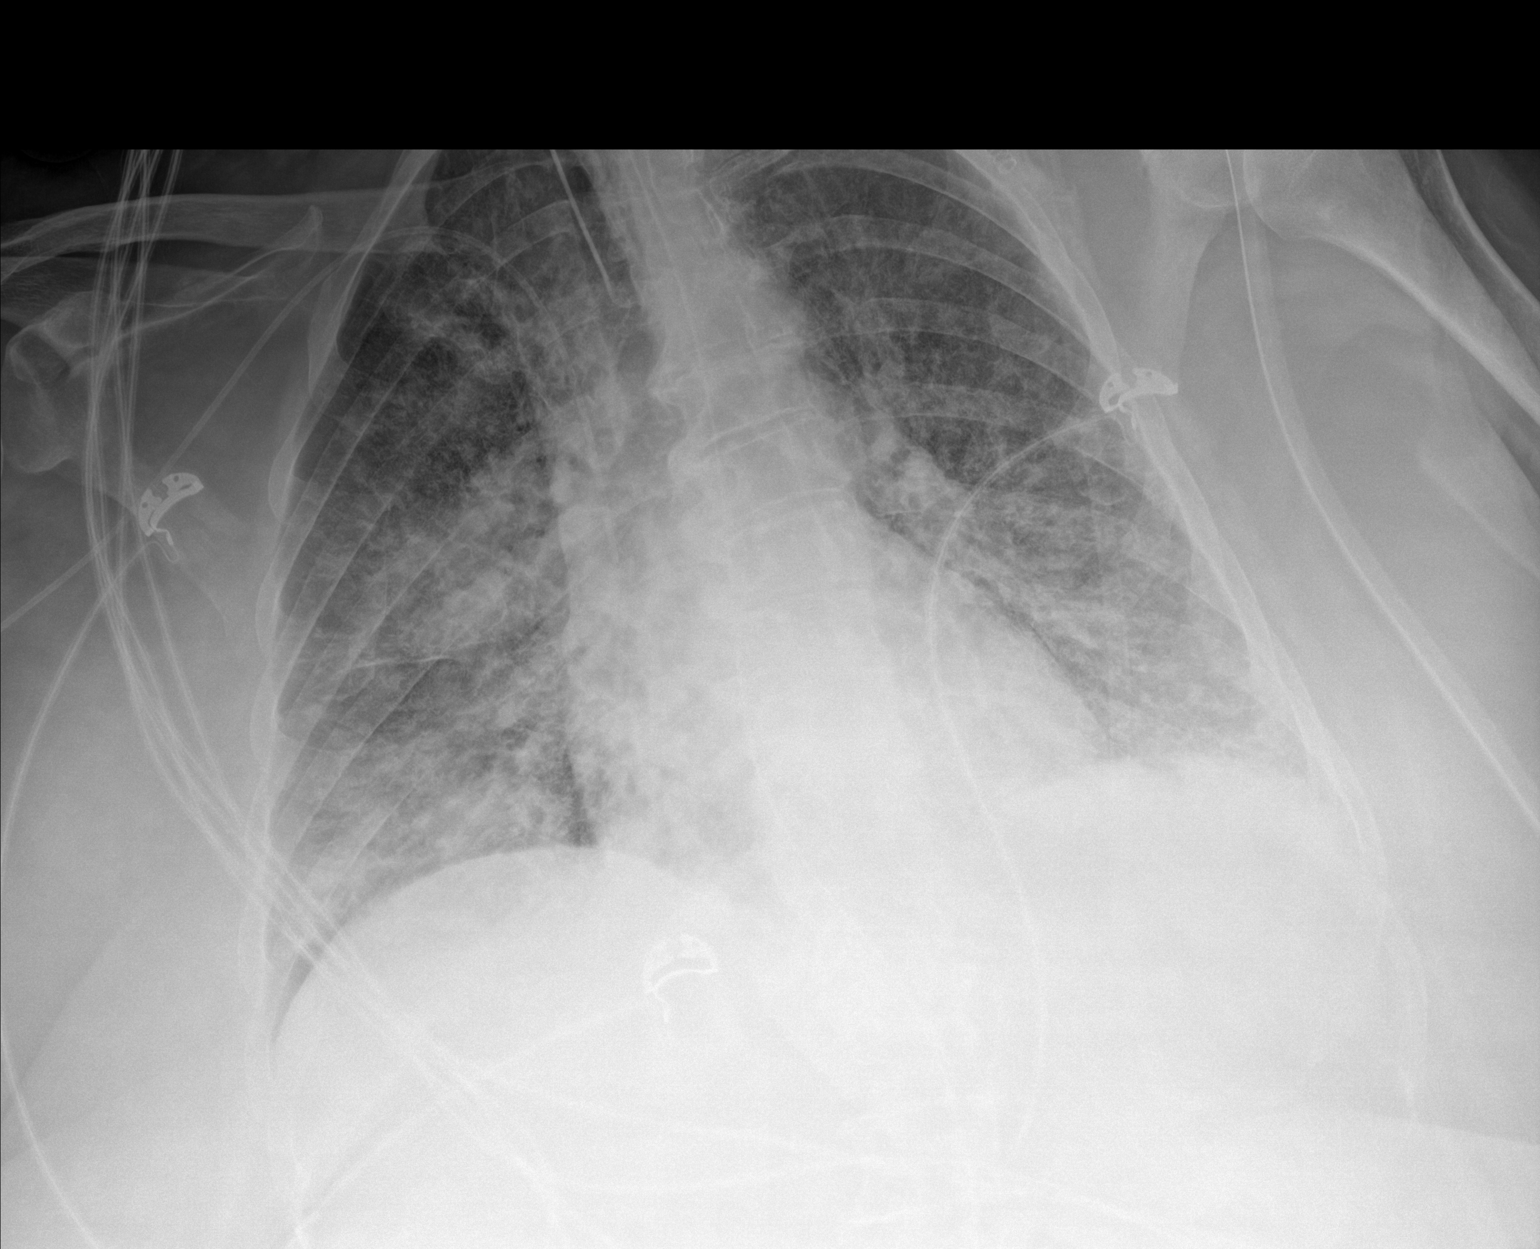

[1 of 1 positions shown; findings below may reference images not displayed]

FINDINGS: Endotracheal tube tip is 3.9 cm above the carina. Central catheter
tip is in the superior vena cava. Nasogastric tube tip and side port
are below the diaphragm. No pneumothorax. Widespread airspace
opacity and interstitial thickening again noted throughout the
lungs, similar to earlier in the day. Heart is borderline prominent
with pulmonary vascularity normal. No adenopathy. No bone lesions.
IMPRESSION: Persistent interstitial thickening and widespread airspace opacity.
Question edema versus multifocal pneumonia; both entities may be
present concurrently. Tube and catheter positions as described
without evident pneumothorax. Essentially stable cardiac silhouette.

## 2021-04-30 IMAGING — DX DG CHEST 1V PORT
1 series · 1 of 1 positions shown · non-contrast
Comparison: January 13, 2020

CLINICAL DATA: Hypoxia

EXAM:
PORTABLE CHEST 1 VIEW

[chest ap]
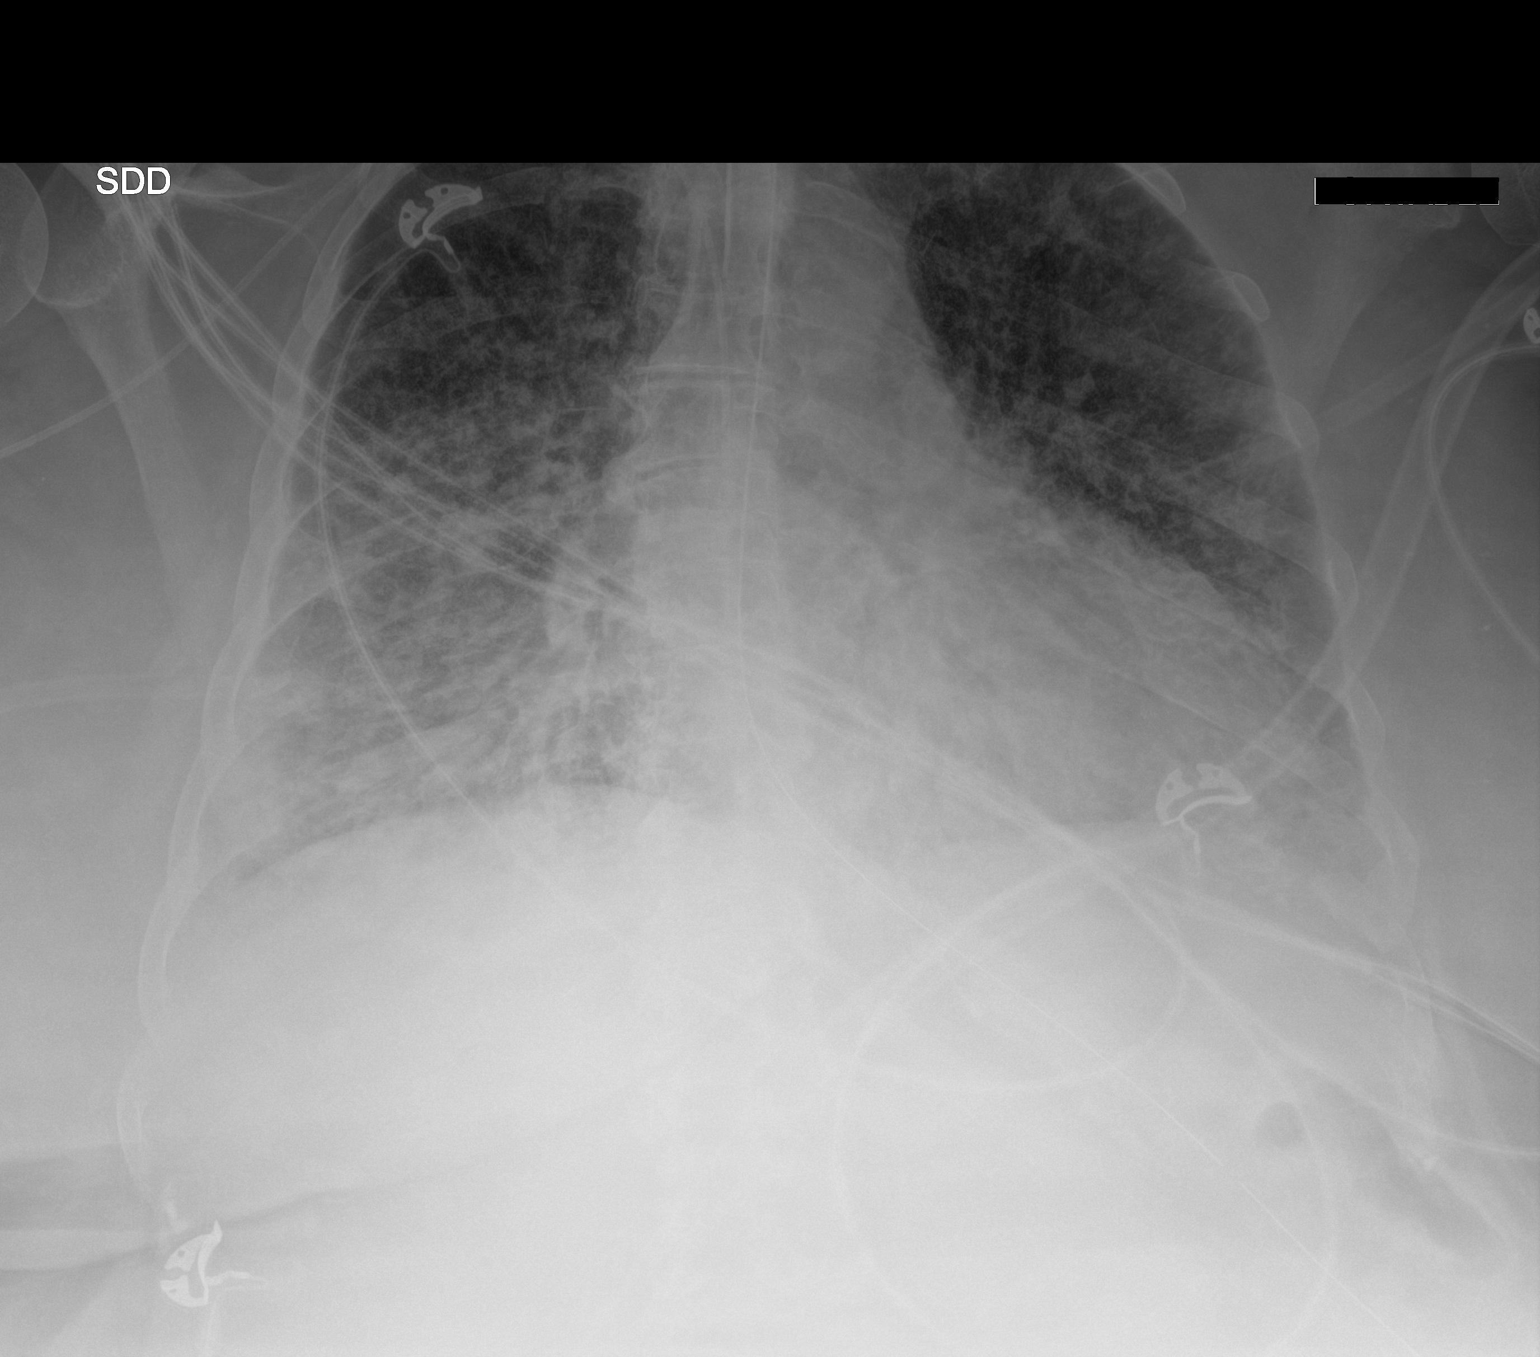

[1 of 1 positions shown; findings below may reference images not displayed]

FINDINGS: Endotracheal tube tip is 3.5 cm above the carina. Nasogastric tube
tip and side port are below the diaphragm. Central catheter tip is
in the superior vena cava. No pneumothorax. There is interstitial
thickening as well as patchy airspace opacity throughout the lungs
bilaterally, similar to recent study. No new opacity evident. Heart
is borderline enlarged with pulmonary vascularity normal. No
adenopathy. No bone lesions.
IMPRESSION: Tube and catheter positions as described without pneumothorax.
Interstitial and airspace opacity bilaterally with mild
cardiomegaly. Question multifocal pneumonia versus edema with
congestive heart failure. Both entities may be present concurrently.
Appearance similar to recent prior study.
